# Patient Record
Sex: Female | Born: 1945
Health system: Southern US, Community
[De-identification: ages and names within clinical notes are randomized; demographics above are authoritative.]

## PROBLEM LIST (undated history)

## (undated) DIAGNOSIS — R053 Chronic cough: Secondary | ICD-10-CM

## (undated) DIAGNOSIS — I517 Cardiomegaly: Secondary | ICD-10-CM

## (undated) DIAGNOSIS — R05 Cough: Secondary | ICD-10-CM

## (undated) DIAGNOSIS — J349 Unspecified disorder of nose and nasal sinuses: Secondary | ICD-10-CM

## (undated) DIAGNOSIS — G4730 Sleep apnea, unspecified: Secondary | ICD-10-CM

## (undated) DIAGNOSIS — I10 Essential (primary) hypertension: Secondary | ICD-10-CM

## (undated) DIAGNOSIS — Z8616 Personal history of COVID-19: Secondary | ICD-10-CM

## (undated) DIAGNOSIS — E039 Hypothyroidism, unspecified: Secondary | ICD-10-CM

## (undated) HISTORY — DX: Essential (primary) hypertension: I10

---
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
## 1975-04-26 HISTORY — PX: TUBAL LIGATION: SHX77

## 1995-04-26 HISTORY — PX: OTHER SURGICAL HISTORY: SHX169

## 2006-02-24 ENCOUNTER — Ambulatory Visit: Payer: Self-pay | Admitting: Internal Medicine

## 2006-05-16 ENCOUNTER — Ambulatory Visit: Payer: Self-pay | Admitting: Internal Medicine

## 2007-05-22 ENCOUNTER — Ambulatory Visit: Payer: Self-pay | Admitting: Internal Medicine

## 2008-01-24 ENCOUNTER — Ambulatory Visit: Payer: Self-pay | Admitting: Unknown Physician Specialty

## 2008-10-06 ENCOUNTER — Ambulatory Visit: Payer: Self-pay | Admitting: Internal Medicine

## 2013-08-01 ENCOUNTER — Ambulatory Visit: Payer: Self-pay | Admitting: Unknown Physician Specialty

## 2013-08-02 LAB — PATHOLOGY REPORT

## 2016-05-25 ENCOUNTER — Other Ambulatory Visit: Payer: Self-pay | Admitting: Nurse Practitioner

## 2016-05-25 ENCOUNTER — Ambulatory Visit
Admission: RE | Admit: 2016-05-25 | Discharge: 2016-05-25 | Disposition: A | Discharge status: Home / Self Care | Payer: Medicare Other | Source: Ambulatory Visit | Attending: Nurse Practitioner | Admitting: Nurse Practitioner

## 2016-05-25 DIAGNOSIS — R05 Cough: Secondary | ICD-10-CM

## 2016-05-25 DIAGNOSIS — R059 Cough, unspecified: Secondary | ICD-10-CM

## 2016-08-18 DIAGNOSIS — S42009A Fracture of unspecified part of unspecified clavicle, initial encounter for closed fracture: Secondary | ICD-10-CM | POA: Insufficient documentation

## 2017-11-09 ENCOUNTER — Ambulatory Visit: Payer: Medicare Other | Admitting: Nurse Practitioner

## 2017-11-09 ENCOUNTER — Encounter: Payer: Self-pay | Admitting: Nurse Practitioner

## 2017-11-09 VITALS — BP 132/78 | HR 72 | Resp 16 | Ht 61.0 in | Wt 156.4 lb

## 2017-11-09 DIAGNOSIS — E559 Vitamin D deficiency, unspecified: Secondary | ICD-10-CM | POA: Diagnosis not present

## 2017-11-09 DIAGNOSIS — Z1239 Encounter for other screening for malignant neoplasm of breast: Secondary | ICD-10-CM | POA: Insufficient documentation

## 2017-11-09 DIAGNOSIS — R229 Localized swelling, mass and lump, unspecified: Secondary | ICD-10-CM

## 2017-11-09 DIAGNOSIS — Z0001 Encounter for general adult medical examination with abnormal findings: Secondary | ICD-10-CM | POA: Diagnosis not present

## 2017-11-09 DIAGNOSIS — R300 Dysuria: Secondary | ICD-10-CM | POA: Insufficient documentation

## 2017-11-09 DIAGNOSIS — Z1231 Encounter for screening mammogram for malignant neoplasm of breast: Secondary | ICD-10-CM | POA: Diagnosis not present

## 2017-11-09 DIAGNOSIS — I10 Essential (primary) hypertension: Secondary | ICD-10-CM | POA: Diagnosis not present

## 2017-11-09 MED ORDER — LISINOPRIL 5 MG PO TABS: 5.0000 mg | ORAL_TABLET | Freq: Every day | ORAL | 4 refills | Status: DC

## 2017-11-09 NOTE — Progress Notes (Signed)
[facility] [place], [place] [address]  Internal MEDICINE  Office Visit Note  Patient Name: Hailey Wong  [id number]  [id number]  Date of Service: 11/09/2017    Pt is here for routine health maintenance examination  Chief Complaint  Patient presents with  . Annual Exam  . Sinusitis    continuous congestion and cold symptoms for months     Sinusitis  This is a chronic problem. The current episode started more than 1 year ago. The problem has been gradually improving since onset. There has been no fever. The fever has been present for less than 1 day. She is experiencing no pain. Associated symptoms include congestion and sinus pressure. Pertinent negatives include no chills, coughing, neck pain, shortness of breath, sneezing or sore throat. Past treatments include antibiotics, acetaminophen and spray decongestants. The treatment provided mild relief.    Current Medication: Outpatient Encounter Medications as of 11/09/2017  Medication Sig  . ergocalciferol (DRISDOL) 50000 units capsule Take 50,000 Units by mouth daily. Take one tablet by mouth daily  . lisinopril (PRINIVIL,ZESTRIL) 5 MG tablet Take 1 tablet (5 mg total) by mouth daily.  . [DISCONTINUED] lisinopril (PRINIVIL,ZESTRIL) 5 MG tablet Take 5 mg by mouth daily.   No facility-administered encounter medications on file as of 11/09/2017.     Surgical History: Past Surgical History:  Procedure Laterality Date  . right ankle surgery  1997  . TUBAL LIGATION  1977    Medical History: Past Medical History:  Diagnosis Date  . Hypertension     Family History: No family history on file.    Review of Systems  Constitutional: Negative for chills, fatigue and unexpected weight change.  HENT: Positive for congestion, postnasal drip, rhinorrhea and sinus pressure. Negative for sneezing and sore throat.   Eyes: Negative.  Negative for redness.  Respiratory: Negative for cough, chest tightness,  shortness of breath and wheezing.   Cardiovascular: Negative for chest pain and palpitations.  Gastrointestinal: Negative for abdominal pain, constipation, diarrhea and nausea.  Endocrine: Negative for cold intolerance, heat intolerance, polydipsia, polyphagia and polyuria.  Genitourinary: Negative for dysuria and frequency.  Musculoskeletal: Negative for arthralgias, back pain, joint swelling and neck pain.  Skin: Negative for rash.  Allergic/Immunologic: Positive for environmental allergies.  Neurological: Negative.  Negative for tremors and numbness.  Hematological: Negative for adenopathy. Does not bruise/bleed easily.  Psychiatric/Behavioral: Negative for behavioral problems (Depression), sleep disturbance and suicidal ideas. The patient is not nervous/anxious.      Today's Vitals   11/09/17 1023  BP: 132/78  Pulse: 72  Resp: 16  SpO2: 99%  Weight: 156 lb 6.4 oz (70.9 kg)  Height: 5\' 1"  (1.549 m)    Physical Exam  Constitutional: She is oriented to person, place, and time. She appears well-developed and well-nourished. No distress.  HENT:  Head: Normocephalic and atraumatic.  Nose: Nose normal.  Mouth/Throat: Oropharynx is clear and moist. No oropharyngeal exudate.  Eyes: Pupils are equal, round, and reactive to light. Conjunctivae and EOM are normal.  Neck: Normal range of motion. Neck supple. No JVD present. Carotid bruit is not present. No tracheal deviation present. No thyromegaly present.  Cardiovascular: Normal rate, regular rhythm, normal heart sounds and intact distal pulses. Exam reveals no gallop and no friction rub.  No murmur heard. Pulmonary/Chest: Effort normal and breath sounds normal. No respiratory distress. She has no wheezes. She has no rales. She exhibits no tenderness.  Abdominal: Soft. Bowel sounds are normal. There is no tenderness.  Musculoskeletal: Normal range of motion.  Soft, round, palpable mass adjacent to [facility] joint of the left shoulder.  Approximately 7cm in diameter. Smooth edges.   Lymphadenopathy:    She has no cervical adenopathy.  Neurological: She is alert and oriented to person, place, and time. No cranial nerve deficit.  Skin: Skin is warm and dry. Capillary refill takes less than 2 seconds. She is not diaphoretic.  Psychiatric: She has a normal mood and affect. Her behavior is normal. Judgment and thought content normal.  Nursing note and vitals reviewed.   Assessment/Plan:  1. Encounter for general adult medical examination with abnormal findings Annual health maintenance exam today - CBC with Differential/Platelet - Comprehensive metabolic panel - T4, free - TSH - Lipid panel  2. Benign hypertension Stable. Continue lisinopril as prescribed. Routine, fasting labs ordered.  - T4, free - TSH - Lipid panel - lisinopril (PRINIVIL,ZESTRIL) 5 MG tablet; Take 1 tablet (5 mg total) by mouth daily.  Dispense: 90 tablet; Refill: 4  3. Localized superficial swelling, mass, or lump Will get prior ultrasound results to review. Follow up study as indicated.   4. Vitamin D deficiency - Vitamin D 1,25 dihydroxy  5. Screening for breast cancer - MM DIGITAL SCREENING BILATERAL; Future  6. Dysuria - UA/M w/rflx Culture, Routine  General Counseling: Shine verbalizes understanding of the findings of todays visit and agrees with plan of treatment. I have discussed any further diagnostic evaluation that may be needed or ordered today. We also reviewed her medications today. she has been encouraged to call the office with any questions or concerns that should arise related to todays visit.    Counseling:  Hypertension Counseling:   The following hypertensive lifestyle modification were recommended and discussed:  1. Limiting alcohol intake to less than 1 oz/day of ethanol:(24 oz of beer or 8 oz of wine or 2 oz of 100-proof whiskey). 2. Take baby ASA 81 mg daily. 3. Importance of regular aerobic exercise and losing  weight. 4. Reduce dietary saturated fat and cholesterol intake for overall cardiovascular health. 5. Maintaining adequate dietary potassium, calcium, and magnesium intake. 6. Regular monitoring of the blood pressure. 7. Reduce sodium intake to less than 100 mmol/day (less than 2.3 gm of sodium or less than 6 gm of sodium choride)   This patient was seen by [place] with Dr Lavera Guise as a part of collaborative care agreement  Orders Placed This Encounter  Procedures  . MM DIGITAL SCREENING BILATERAL  . UA/M w/rflx Culture, Routine  . CBC with Differential/Platelet  . Comprehensive metabolic panel  . T4, free  . TSH  . Lipid panel  . Vitamin D 1,25 dihydroxy    Meds ordered this encounter  Medications  . lisinopril (PRINIVIL,ZESTRIL) 5 MG tablet    Sig: Take 1 tablet (5 mg total) by mouth daily.    Dispense:  90 tablet    Refill:  4    Order Specific Question:   Supervising Provider    Answer:   Lavera Guise [[id number]]    Time spent: [place], MD  Internal Medicine

## 2017-11-10 LAB — SPECIMEN STATUS REPORT

## 2017-11-14 LAB — UA/M W/RFLX CULTURE, ROUTINE
BILIRUBIN UA: NEGATIVE
Glucose, UA: NEGATIVE
Ketones, UA: NEGATIVE
Nitrite, UA: NEGATIVE
Protein, UA: NEGATIVE
RBC UA: NEGATIVE
Specific Gravity, UA: 1.008 (ref 1.005–1.030)
Urobilinogen, Ur: 0.2 mg/dL (ref 0.2–1.0)
pH, UA: 6 (ref 5.0–7.5)

## 2017-11-14 LAB — URINE CULTURE, REFLEX

## 2017-11-14 LAB — MICROSCOPIC EXAMINATION: CASTS: NONE SEEN /LPF

## 2017-11-14 NOTE — Progress Notes (Signed)
Can you find out why this culture was canceled/ thanks

## 2017-11-21 ENCOUNTER — Other Ambulatory Visit: Payer: Self-pay | Admitting: Nurse Practitioner

## 2017-11-21 MED ORDER — ERGOCALCIFEROL 1.25 MG (50000 UT) PO CAPS: 50000.0000 [IU] | ORAL_CAPSULE | Freq: Every day | ORAL | 1 refills | Status: DC

## 2017-12-11 LAB — COMPREHENSIVE METABOLIC PANEL
A/G RATIO: 1.9 (ref 1.2–2.2)
ALBUMIN: 4.1 g/dL (ref 3.5–4.8)
ALK PHOS: 70 IU/L (ref 39–117)
ALT: 9 IU/L (ref 0–32)
AST: 15 IU/L (ref 0–40)
BILIRUBIN TOTAL: 0.4 mg/dL (ref 0.0–1.2)
BUN / CREAT RATIO: 13 (ref 12–28)
BUN: 9 mg/dL (ref 8–27)
CO2: 23 mmol/L (ref 20–29)
Calcium: 9.1 mg/dL (ref 8.7–10.3)
Chloride: 98 mmol/L (ref 96–106)
Creatinine, Ser: 0.69 mg/dL (ref 0.57–1.00)
GFR calc Af Amer: 101 mL/min/{1.73_m2} (ref >59)
GFR calc non Af Amer: 87 mL/min/{1.73_m2} (ref >59)
GLOBULIN, TOTAL: 2.2 g/dL (ref 1.5–4.5)
Glucose: 120 mg/dL — ABNORMAL HIGH (ref 65–99)
POTASSIUM: 4.3 mmol/L (ref 3.5–5.2)
SODIUM: 135 mmol/L (ref 134–144)
Total Protein: 6.3 g/dL (ref 6.0–8.5)

## 2017-12-11 LAB — CBC WITH DIFFERENTIAL/PLATELET
BASOS: 0 %
Basophils Absolute: 0 10*3/uL (ref 0.0–0.2)
EOS (ABSOLUTE): 0.2 10*3/uL (ref 0.0–0.4)
EOS: 3 %
HEMATOCRIT: 43.7 % (ref 34.0–46.6)
Hemoglobin: 14.6 g/dL (ref 11.1–15.9)
Immature Grans (Abs): 0 10*3/uL (ref 0.0–0.1)
Immature Granulocytes: 0 %
LYMPHS ABS: 2.7 10*3/uL (ref 0.7–3.1)
Lymphs: 34 %
MCH: 31.3 pg (ref 26.6–33.0)
MCHC: 33.4 g/dL (ref 31.5–35.7)
MCV: 94 fL (ref 79–97)
MONOS ABS: 0.8 10*3/uL (ref 0.1–0.9)
Monocytes: 10 %
NEUTROS ABS: 4.2 10*3/uL (ref 1.4–7.0)
Neutrophils: 53 %
Platelets: 295 10*3/uL (ref 150–450)
RBC: 4.66 x10E6/uL (ref 3.77–5.28)
RDW: 14.3 % (ref 12.3–15.4)
WBC: 7.8 10*3/uL (ref 3.4–10.8)

## 2017-12-11 LAB — VITAMIN D 1,25 DIHYDROXY
VITAMIN D 1, 25 (OH) TOTAL: 70 pg/mL — AB
VITAMIN D3 1, 25 (OH): 28 pg/mL
Vitamin D2 1, 25 (OH)2: 42 pg/mL

## 2017-12-11 LAB — LIPID PANEL
CHOL/HDL RATIO: 2.3 ratio (ref 0.0–4.4)
Cholesterol, Total: 198 mg/dL (ref 100–199)
HDL: 88 mg/dL (ref >39)
LDL Calculated: 95 mg/dL (ref 0–99)
Triglycerides: 75 mg/dL (ref 0–149)
VLDL Cholesterol Cal: 15 mg/dL (ref 5–40)

## 2017-12-11 LAB — TSH: TSH: 1.27 u[IU]/mL (ref 0.450–4.500)

## 2017-12-11 LAB — T4, FREE: FREE T4: 1.56 ng/dL (ref 0.82–1.77)

## 2017-12-15 ENCOUNTER — Telehealth: Payer: Self-pay

## 2017-12-15 NOTE — Progress Notes (Signed)
Called pt and notified her of labs looking good.

## 2017-12-15 NOTE — Telephone Encounter (Signed)
PT WAS NOTIFIED OF HER LABS LOOKING GOOD.

## 2018-04-12 ENCOUNTER — Telehealth: Payer: Self-pay | Admitting: Nurse Practitioner

## 2018-04-12 NOTE — Telephone Encounter (Signed)
LEFT MESSAGE TO RESCHEDULE 11/19/18 APPOINTMENT.JW

## 2018-08-14 ENCOUNTER — Encounter: Payer: Self-pay | Admitting: Nurse Practitioner

## 2018-08-14 ENCOUNTER — Other Ambulatory Visit: Payer: Self-pay

## 2018-08-14 ENCOUNTER — Ambulatory Visit: Payer: Medicare Other | Admitting: Nurse Practitioner

## 2018-08-14 VITALS — BP 142/107 | HR 84 | Temp 98.2°F | Resp 16 | Ht 61.0 in | Wt 158.0 lb

## 2018-08-14 DIAGNOSIS — R040 Epistaxis: Secondary | ICD-10-CM | POA: Insufficient documentation

## 2018-08-14 DIAGNOSIS — I10 Essential (primary) hypertension: Secondary | ICD-10-CM | POA: Diagnosis not present

## 2018-08-14 MED ORDER — ATENOLOL 25 MG PO TABS: 25.0000 mg | ORAL_TABLET | Freq: Every evening | ORAL | 2 refills | Status: DC

## 2018-08-14 NOTE — Progress Notes (Signed)
[facility] [place], [place] [address]  Internal MEDICINE  Office Visit Note  Patient Name: Hailey Wong  [id number]  [id number]  Date of Service: 08/14/2018   Pt is here for a sick visit.  Chief Complaint  Patient presents with  . Epistaxis    three bad nose bleeds yesterday   . Hypertension    bp has been running      The patient states that she had episode of nose bleed yesterday. Blood pressure was severely elevated. When checked at home, her blood pressure was 235/104. She did take a second of her lisinopril 5mg  tablets. Did come down some, but then both nostrils started to bleed. Called paramedics, and her blood pressure was 198/106. When she arrived at [facility] ER her blood pressure was over 948 systolic/ /546 systolic. She was put on heart monitor and rested. After two hours, her blood pressure came down to 167/90. She was discharged home. She was not given anything to lower her blood pressure. She was given dose of afrin in ER. Was told to begin using her flonase nasal spray every day and use humidfier at home. She was told to follow up with PCP. This morning, at home, your blood pressure was 227/60. She denies headache, but does feel some pressure in her head, mostly behind her eyes.  She has some visual changes, but unsure if this is related to blood pressure or changes in her vision.        Current Medication:  Outpatient Encounter Medications as of 08/14/2018  Medication Sig  . lisinopril (PRINIVIL,ZESTRIL) 5 MG tablet Take 1 tablet (5 mg total) by mouth daily.  Marland Kitchen atenolol (TENORMIN) 25 MG tablet Take 1 tablet (25 mg total) by mouth every evening.  . ergocalciferol (DRISDOL) 50000 units capsule Take 1 capsule (50,000 Units total) by mouth daily. Take one tablet by mouth daily (Patient not taking: Reported on 08/14/2018)   No facility-administered encounter medications on file as of 08/14/2018.       Medical History: Past Medical History:   Diagnosis Date  . Hypertension      Today's Vitals   08/14/18 0844  BP: (!) 142/107  Pulse: 84  Resp: 16  Temp: 98.2 F (36.8 C)  SpO2: 99%  Weight: 158 lb (71.7 kg)  Height: 5\' 1"  (1.549 m)   Body mass index is 29.85 kg/m.  Review of Systems  Constitutional: Negative for chills, fatigue and unexpected weight change.  HENT: Positive for nosebleeds and sinus pressure. Negative for congestion, rhinorrhea, sneezing and sore throat. Tinnitus: l.   Eyes: Negative for redness.  Respiratory: Negative for stridor.   Cardiovascular: Negative for chest pain and palpitations.       Blood pressure has been very elevated   Gastrointestinal: Negative for abdominal pain, constipation, diarrhea, nausea and vomiting.  Endocrine: Negative for cold intolerance, heat intolerance, polydipsia and polyuria.  Musculoskeletal: Negative for neck pain.  Skin: Negative for rash.  Allergic/Immunologic: Positive for environmental allergies.  Neurological: Positive for headaches. Negative for tremors and numbness.  Hematological: Negative for adenopathy. Does not bruise/bleed easily.  Psychiatric/Behavioral: Negative for behavioral problems (Depression), sleep disturbance and suicidal ideas. The patient is not nervous/anxious.     Physical Exam Constitutional:      General: She is not in acute distress.    Appearance: Normal appearance. She is well-developed. She is not diaphoretic.  HENT:     Head: Normocephalic and atraumatic.     Nose:  Right Nostril: Epistaxis and septal hematoma present.     Left Nostril: Epistaxis and septal hematoma present.     Right Turbinates: Enlarged.     Left Turbinates: Enlarged.     Mouth/Throat:     Pharynx: No oropharyngeal exudate.  Eyes:     Pupils: Pupils are equal, round, and reactive to light.  Neck:     Musculoskeletal: Normal range of motion and neck supple.     Thyroid: No thyromegaly.     Vascular: No JVD.     Trachea: No tracheal deviation.   Cardiovascular:     Rate and Rhythm: Normal rate and regular rhythm.     Heart sounds: Normal heart sounds. No murmur. No friction rub. No gallop.   Pulmonary:     Effort: Pulmonary effort is normal. No respiratory distress.     Breath sounds: No wheezing or rales.  Chest:     Chest wall: No tenderness.  Abdominal:     General: Bowel sounds are normal.     Palpations: Abdomen is soft.  Musculoskeletal: Normal range of motion.  Lymphadenopathy:     Cervical: No cervical adenopathy.  Skin:    General: Skin is warm and dry.  Neurological:     Mental Status: She is alert and oriented to person, place, and time.     Cranial Nerves: No cranial nerve deficit.  Psychiatric:        Behavior: Behavior normal.        Thought Content: Thought content normal.        Judgment: Judgment normal.    Assessment/Plan: 1. Benign hypertension Add atenolol 25mg  every evening. Continue lisinopril 5mg  daily. Check blood pressures at home and notify office of any negative side effects of medications or worsening blood pressures.  - atenolol (TENORMIN) 25 MG tablet; Take 1 tablet (25 mg total) by mouth every evening.  Dispense: 30 tablet; Refill: 2  2. Epistaxis Possibly related to elevated blood pressures. Encouraged her to use flonase nasal spray daily, as suggested in ER yesterday. May consider referral to ENT in future.   General Counseling: Hailey Wong understanding of the findings of todays visit and agrees with plan of treatment. I have discussed any further diagnostic evaluation that may be needed or ordered today. We also reviewed her medications today. she has been encouraged to call the office with any questions or concerns that should arise related to todays visit.    Counseling:  Hypertension Counseling:   The following hypertensive lifestyle modification were recommended and discussed:  1. Limiting alcohol intake to less than 1 oz/day of ethanol:(24 oz of beer or 8 oz of wine or  2 oz of 100-proof whiskey). 2. Take baby ASA 81 mg daily. 3. Importance of regular aerobic exercise and losing weight. 4. Reduce dietary saturated fat and cholesterol intake for overall cardiovascular health. 5. Maintaining adequate dietary potassium, calcium, and magnesium intake. 6. Regular monitoring of the blood pressure. 7. Reduce sodium intake to less than 100 mmol/day (less than 2.3 gm of sodium or less than 6 gm of sodium choride)   This patient was seen by [place] with Dr Lavera Guise as a part of collaborative care agreement  Meds ordered this encounter  Medications  . atenolol (TENORMIN) 25 MG tablet    Sig: Take 1 tablet (25 mg total) by mouth every evening.    Dispense:  30 tablet    Refill:  2    Order Specific Question:  Supervising Provider    Answer:   Lavera Guise [[id number]]    Time spent: 25 Minutes

## 2018-08-15 ENCOUNTER — Telehealth: Payer: Self-pay | Admitting: Nurse Practitioner

## 2018-08-15 ENCOUNTER — Encounter: Payer: Self-pay | Admitting: Nurse Practitioner

## 2018-08-15 NOTE — Telephone Encounter (Signed)
Called patient in regards to ultrasound results of shoulder , patient advised normal ultrasound. Patient is keeping a constant eye on her bp. She stated that this morning it was 167/94, and she had one more nose bleed yesterday afternoon. Patient is to notify us if anything worsens.

## 2018-08-17 ENCOUNTER — Telehealth: Payer: Self-pay | Admitting: Nurse Practitioner

## 2018-08-17 NOTE — Telephone Encounter (Signed)
Patient was seen on Tuesday with increase bp , patient was put on Atenolol 25mg  and her bp is coming down but pulse is 53 , per heather cut the atenolol to 1/2 tab at night and monitor , call us if any changes. Patient understood.

## 2018-09-03 ENCOUNTER — Other Ambulatory Visit: Payer: Self-pay

## 2018-09-03 ENCOUNTER — Ambulatory Visit: Payer: Medicare Other | Admitting: Nurse Practitioner

## 2018-09-03 ENCOUNTER — Encounter: Payer: Self-pay | Admitting: Nurse Practitioner

## 2018-09-03 VITALS — BP 142/71 | HR 59 | Resp 16 | Ht 61.0 in | Wt 153.6 lb

## 2018-09-03 DIAGNOSIS — I10 Essential (primary) hypertension: Secondary | ICD-10-CM

## 2018-09-03 DIAGNOSIS — F17210 Nicotine dependence, cigarettes, uncomplicated: Secondary | ICD-10-CM | POA: Insufficient documentation

## 2018-09-03 DIAGNOSIS — R0609 Other forms of dyspnea: Secondary | ICD-10-CM | POA: Diagnosis not present

## 2018-09-03 NOTE — Progress Notes (Signed)
Pt blood pressure elevated, taken twice  1st reading 158/83 2nd reading: 142/88 (home machine) Office machine- 142/71 Informed provider Pt pulse low

## 2018-09-03 NOTE — Progress Notes (Signed)
[facility] [place], [place] [address]  Internal MEDICINE  Office Visit Note  Patient Name: Hailey Wong  [id number]  [id number]  Date of Service: 09/03/2018  Chief Complaint  Patient presents with  . Medical Management of Chronic Issues    3wk follow up  . Hypertension    dont understand the fluctuation of blood pressure    The patient is here to follow up of her blood pressure. Added atenolol 25mg  daily. Taking this in the evenings. She has limited, significantly her sodium intake. Taken caffeine completely out of her diet. She monitors her blood pressure twice daily. In the evenings, it is generally running in the 546E and 703J systolic and 00X to 38H diastolic. In the mornings, she takes her blood pressure prior to taking her blod pressure medication. These readings are generally higher. They run in 829H and 371I systolic and 96V diastolic. She states that she has tolerated the medication addition well. Has no negative side effects to report. She states that she does get short of breat with exertion and she continues to smoke cigarettes. She has no family history of CAD that she knows of.       Current Medication: Outpatient Encounter Medications as of 09/03/2018  Medication Sig  . atenolol (TENORMIN) 25 MG tablet Take 1 tablet (25 mg total) by mouth every evening.  Marland Kitchen lisinopril (PRINIVIL,ZESTRIL) 5 MG tablet Take 1 tablet (5 mg total) by mouth daily.  . [DISCONTINUED] ergocalciferol (DRISDOL) 50000 units capsule Take 1 capsule (50,000 Units total) by mouth daily. Take one tablet by mouth daily (Patient not taking: Reported on 08/14/2018)   No facility-administered encounter medications on file as of 09/03/2018.     Surgical History: Past Surgical History:  Procedure Laterality Date  . right ankle surgery  1997  . TUBAL LIGATION  1977    Medical History: Past Medical History:  Diagnosis Date  . Hypertension     Family History: History reviewed.  No pertinent family history.  Social History   Socioeconomic History  . Marital status: Married    Spouse name: Not on file  . Number of children: Not on file  . Years of education: Not on file  . Highest education level: Not on file  Occupational History  . Not on file  Social Needs  . Financial resource strain: Not on file  . Food insecurity:    Worry: Not on file    Inability: Not on file  . Transportation needs:    Medical: Not on file    Non-medical: Not on file  Tobacco Use  . Smoking status: Current Every Day Smoker    Types: Cigarettes  . Smokeless tobacco: Never Used  Substance and Sexual Activity  . Alcohol use: Yes    Alcohol/week: 1.0 standard drinks    Types: 1 Glasses of wine per week    Comment: wine daily  . Drug use: Not on file  . Sexual activity: Not on file  Lifestyle  . Physical activity:    Days per week: Not on file    Minutes per session: Not on file  . Stress: Not on file  Relationships  . Social connections:    Talks on phone: Not on file    Gets together: Not on file    Attends religious service: Not on file    Active member of club or organization: Not on file    Attends meetings of clubs or organizations: Not on file  Relationship status: Not on file  . Intimate partner violence:    Fear of current or ex partner: Not on file    Emotionally abused: Not on file    Physically abused: Not on file    Forced sexual activity: Not on file  Other Topics Concern  . Not on file  Social History Narrative  . Not on file      Review of Systems  Constitutional: Negative for chills, fatigue and unexpected weight change.  HENT: Negative for congestion, nosebleeds, rhinorrhea, sinus pressure, sneezing and sore throat. Tinnitus: l.   Respiratory: Positive for shortness of breath. Negative for chest tightness and stridor.        Admits to some shortness of breath with exertion.   Cardiovascular: Negative for chest pain and palpitations.        Improved blood pressure   Gastrointestinal: Negative for abdominal pain, constipation, diarrhea, nausea and vomiting.  Endocrine: Negative for cold intolerance, heat intolerance, polydipsia and polyuria.  Musculoskeletal: Negative for neck pain.  Skin: Negative for rash.  Allergic/Immunologic: Positive for environmental allergies.  Neurological: Negative for tremors, numbness and headaches.  Hematological: Negative for adenopathy. Does not bruise/bleed easily.  Psychiatric/Behavioral: Negative for behavioral problems (Depression), sleep disturbance and suicidal ideas. The patient is not nervous/anxious.     Vital Signs: BP (!) 142/71 (BP Location: Left Arm, Patient Position: Sitting, Cuff Size: Large) Comment: 1st reading 158/83  Pulse (!) 59   Resp 16   Ht 5\' 1"  (1.549 m)   Wt 153 lb 9.6 oz (69.7 kg)   SpO2 98%   BMI 29.02 kg/m    Physical Exam Vitals signs and nursing note reviewed.  Constitutional:      General: She is not in acute distress.    Appearance: Normal appearance. She is well-developed. She is not diaphoretic.  HENT:     Head: Normocephalic and atraumatic.     Mouth/Throat:     Pharynx: No oropharyngeal exudate.  Eyes:     Pupils: Pupils are equal, round, and reactive to light.  Neck:     Musculoskeletal: Normal range of motion and neck supple.     Thyroid: No thyromegaly.     Vascular: No JVD.     Trachea: No tracheal deviation.  Cardiovascular:     Rate and Rhythm: Normal rate and regular rhythm.     Heart sounds: Normal heart sounds. No murmur. No friction rub. No gallop.   Pulmonary:     Effort: Pulmonary effort is normal. No respiratory distress.     Breath sounds: Normal breath sounds. No wheezing or rales.  Chest:     Chest wall: No tenderness.  Musculoskeletal: Normal range of motion.  Lymphadenopathy:     Cervical: No cervical adenopathy.  Skin:    General: Skin is warm and dry.  Neurological:     Mental Status: She is alert and oriented to  person, place, and time.     Cranial Nerves: No cranial nerve deficit.  Psychiatric:        Behavior: Behavior normal.        Thought Content: Thought content normal.        Judgment: Judgment normal.    Assessment/Plan: 1. Benign hypertension Improved bp. Continue listinopril and atenolol as prescribed. Recommend she continue monitoring blood pressures closely. Advised her to take AM bloodpressure about an hour after she takes medication. Discussed heart healthy diet. Will get echocardiogram to evaluate cardiac function.  - ECHOCARDIOGRAM COMPLETE; Future  2. Dyspnea  on exertion Will get echocardiogram and exercise stress test for further evaluation.  - ECHOCARDIOGRAM COMPLETE; Future  3. Cigarette smoker Smoking cessation discussed. Patient not really ready to quit smoking at this time.   General Counseling: numa heatwole understanding of the findings of todays visit and agrees with plan of treatment. I have discussed any further diagnostic evaluation that may be needed or ordered today. We also reviewed her medications today. she has been encouraged to call the office with any questions or concerns that should arise related to todays visit.  Cardiac risk factor modification:  1. Control blood pressure. 2. Exercise as prescribed. 3. Follow low sodium, low fat diet. and low fat and low cholestrol diet. 4. Take ASA 81mg  once a day. 5. Restricted calories diet to lose weight.  This patient was seen by Leretha Pol FNP Collaboration with Dr Lavera Guise as a part of collaborative care agreement  Orders Placed This Encounter  Procedures  . EXERCISE TOLERANCE TEST (ETT)  . ECHOCARDIOGRAM COMPLETE      Time spent: 77 Minutes      Dr Lavera Guise Internal medicine

## 2018-09-14 ENCOUNTER — Other Ambulatory Visit: Payer: Self-pay

## 2018-09-14 ENCOUNTER — Ambulatory Visit: Payer: Medicare Other

## 2018-09-14 DIAGNOSIS — I10 Essential (primary) hypertension: Secondary | ICD-10-CM

## 2018-09-14 DIAGNOSIS — R0600 Dyspnea, unspecified: Secondary | ICD-10-CM | POA: Diagnosis not present

## 2018-09-14 DIAGNOSIS — R0609 Other forms of dyspnea: Secondary | ICD-10-CM

## 2018-09-14 DIAGNOSIS — F17210 Nicotine dependence, cigarettes, uncomplicated: Secondary | ICD-10-CM

## 2018-09-24 ENCOUNTER — Telehealth: Payer: Self-pay | Admitting: Nurse Practitioner

## 2018-09-24 NOTE — Telephone Encounter (Signed)
Patient called requesting results for Echo performed on 09-14-18. I see where they were scanned in her chart, but I did not see where you had reviewed them. Can you let me know if you have went over them. Do you want me to schedule an appt for the results or release over phone?

## 2018-11-06 ENCOUNTER — Encounter: Payer: Self-pay | Admitting: Nurse Practitioner

## 2018-11-06 ENCOUNTER — Ambulatory Visit: Payer: Medicare Other | Admitting: Nurse Practitioner

## 2018-11-06 ENCOUNTER — Other Ambulatory Visit: Payer: Self-pay

## 2018-11-06 VITALS — BP 151/83 | HR 55 | Resp 16 | Ht 61.0 in | Wt 150.4 lb

## 2018-11-06 DIAGNOSIS — F17210 Nicotine dependence, cigarettes, uncomplicated: Secondary | ICD-10-CM

## 2018-11-06 DIAGNOSIS — R300 Dysuria: Secondary | ICD-10-CM

## 2018-11-06 DIAGNOSIS — I10 Essential (primary) hypertension: Secondary | ICD-10-CM | POA: Diagnosis not present

## 2018-11-06 DIAGNOSIS — Z1239 Encounter for other screening for malignant neoplasm of breast: Secondary | ICD-10-CM | POA: Diagnosis not present

## 2018-11-06 DIAGNOSIS — Z0001 Encounter for general adult medical examination with abnormal findings: Secondary | ICD-10-CM

## 2018-11-06 MED ORDER — ATENOLOL 25 MG PO TABS: 25.0000 mg | ORAL_TABLET | Freq: Every evening | ORAL | 3 refills | Status: DC

## 2018-11-06 MED ORDER — LISINOPRIL 10 MG PO TABS: 10.0000 mg | ORAL_TABLET | Freq: Every day | ORAL | 3 refills | Status: DC

## 2018-11-06 NOTE — Progress Notes (Signed)
Pt blood pressure elevated, pt took blood pressure this morning at 630 am it was 117/68

## 2018-11-06 NOTE — Progress Notes (Signed)
[facility] [place], [place] [address]  Internal MEDICINE  Office Visit Note  Patient Name: Hailey Wong  [id number]  [id number]  Date of Service: 11/06/2018   Pt is here for routine health maintenance examination  Chief Complaint  Patient presents with  . Medicare Wellness  . Hypertension  . Quality Metric Gaps    colonoscopy, mammogram     The patient is here for health maintenance exam. Blood pressure is still elevated. She has brought blood pressure log with her. Generally running high 140s to mid 967E systolic and between 70 and 80 diastaltically. She admits that she doesn't always make the best diet choices. Is limiting her salt intake. Takes lisinopril 5mg  in the morning and atenolol 25mg  at night. She had been having nosebleeds prior to starting on lisinopril. Has not had another since this medication was added. She did have echocardiogram in May. This showed normal LVEF with mild left ventricular hypertrophy. There is mild aortic root dilation as well as some dilation of the ascending aorta. There is also mild aotic valve sclerosis. She states that she feels good. She is due to have routine, fasting labs done as well as mammogram in 11/2018.     Current Medication: Outpatient Encounter Medications as of 11/06/2018  Medication Sig  . atenolol (TENORMIN) 25 MG tablet Take 1 tablet (25 mg total) by mouth every evening.  Marland Kitchen lisinopril (ZESTRIL) 10 MG tablet Take 1 tablet (10 mg total) by mouth daily.  . [DISCONTINUED] atenolol (TENORMIN) 25 MG tablet Take 1 tablet (25 mg total) by mouth every evening.  . [DISCONTINUED] lisinopril (PRINIVIL,ZESTRIL) 5 MG tablet Take 1 tablet (5 mg total) by mouth daily.   No facility-administered encounter medications on file as of 11/06/2018.     Surgical History: Past Surgical History:  Procedure Laterality Date  . right ankle surgery  1997  . TUBAL LIGATION  1977    Medical History: Past Medical History:   Diagnosis Date  . Hypertension     Family History: History reviewed. No pertinent family history.    Review of Systems  Constitutional: Negative for chills, fatigue and unexpected weight change.  HENT: Negative for congestion, nosebleeds, rhinorrhea, sinus pressure, sneezing and sore throat. Tinnitus: l.   Respiratory: Negative for chest tightness, shortness of breath and stridor.        Admits to some shortness of breath with exertion.   Cardiovascular: Negative for chest pain and palpitations.       Elevated blood pressure.   Gastrointestinal: Negative for abdominal pain, constipation, diarrhea, nausea and vomiting.  Endocrine: Negative for cold intolerance, heat intolerance, polydipsia and polyuria.  Musculoskeletal: Negative for neck pain.  Skin: Negative for rash.  Allergic/Immunologic: Positive for environmental allergies.  Neurological: Negative for tremors, numbness and headaches.  Hematological: Negative for adenopathy. Does not bruise/bleed easily.  Psychiatric/Behavioral: Negative for behavioral problems (Depression), sleep disturbance and suicidal ideas. The patient is not nervous/anxious.     Today's Vitals   11/06/18 0916  BP: (!) 151/83  Pulse: (!) 55  Resp: 16  SpO2: 100%  Weight: 150 lb 6.4 oz (68.2 kg)  Height: 5\' 1"  (1.549 m)   Body mass index is 28.42 kg/m.  Physical Exam Vitals signs and nursing note reviewed.  Constitutional:      General: She is not in acute distress.    Appearance: Normal appearance. She is well-developed. She is not diaphoretic.  HENT:     Head: Normocephalic and atraumatic.  Mouth/Throat:     Pharynx: No oropharyngeal exudate.  Eyes:     Extraocular Movements: Extraocular movements intact.     Conjunctiva/sclera: Conjunctivae normal.     Pupils: Pupils are equal, round, and reactive to light.  Neck:     Musculoskeletal: Normal range of motion and neck supple.     Thyroid: No thyromegaly.     Vascular: No carotid  bruit or JVD.     Trachea: No tracheal deviation.  Cardiovascular:     Rate and Rhythm: Normal rate and regular rhythm.     Pulses: Normal pulses.     Heart sounds: Normal heart sounds. No murmur. No friction rub. No gallop.   Pulmonary:     Effort: Pulmonary effort is normal. No respiratory distress.     Breath sounds: Normal breath sounds. No wheezing or rales.  Chest:     Chest wall: No tenderness.     Breasts:        Right: Inverted nipple present. No swelling, bleeding, mass, nipple discharge, skin change or tenderness.        Left: Inverted nipple present. No swelling, bleeding, mass, nipple discharge, skin change or tenderness.  Abdominal:     General: Abdomen is flat.     Palpations: Abdomen is soft.     Tenderness: There is no abdominal tenderness.  Musculoskeletal: Normal range of motion.  Lymphadenopathy:     Cervical: No cervical adenopathy.  Skin:    General: Skin is warm and dry.  Neurological:     General: No focal deficit present.     Mental Status: She is alert and oriented to person, place, and time.     Cranial Nerves: No cranial nerve deficit.  Psychiatric:        Behavior: Behavior normal.        Thought Content: Thought content normal.        Judgment: Judgment normal.    Depression screen [facility] 2/9 11/06/2018 09/03/2018 11/09/2017 11/09/2017  Decreased Interest 0 0 0 0  Down, Depressed, Hopeless 0 0 0 0  PHQ - 2 Score 0 0 0 0    Functional Status Survey: Is the patient deaf or have difficulty hearing?: No Does the patient have difficulty seeing, even when wearing glasses/contacts?: No Does the patient have difficulty concentrating, remembering, or making decisions?: No Does the patient have difficulty walking or climbing stairs?: No Does the patient have difficulty dressing or bathing?: No Does the patient have difficulty doing errands alone such as visiting a doctor's office or shopping?: No  MMSE - Mini Mental State Exam 11/06/2018 11/09/2017   Orientation to time 5 5  Orientation to Place 5 5  Registration 3 3  Attention/ Calculation 5 5  Recall 3 3  Language- name 2 objects 2 2  Language- repeat 1 1  Language- follow 3 step command 3 3  Language- read & follow direction 1 1  Write a sentence 1 1  Copy design 1 1  Total score 30 30    Fall Risk  11/06/2018 09/03/2018 11/09/2017 11/09/2017  Falls in the past year? 0 0 No No    Assessment/Plan: 1. Encounter for general adult medical examination with abnormal findings Annual health maintenance exam today.   2. Benign hypertension Increase lisinopril to 10mg  daily. Continue atenolol as prescribed.  - lisinopril (ZESTRIL) 10 MG tablet; Take 1 tablet (10 mg total) by mouth daily.  Dispense: 30 tablet; Refill: 3 - atenolol (TENORMIN) 25 MG tablet; Take 1 tablet (25  mg total) by mouth every evening.  Dispense: 90 tablet; Refill: 3  3. Screening for breast cancer - MM DIGITAL SCREENING BILATERAL; Future  4. Cigarette smoker sterongly encouraged smoking cessation.   5. Dysuria - UA/M w/rflx Culture, Routine  General Counseling: Salisa verbalizes understanding of the findings of todays visit and agrees with plan of treatment. I have discussed any further diagnostic evaluation that may be needed or ordered today. We also reviewed her medications today. she has been encouraged to call the office with any questions or concerns that should arise related to todays visit.    Counseling:  Hypertension Counseling:   The following hypertensive lifestyle modification were recommended and discussed:  1. Limiting alcohol intake to less than 1 oz/day of ethanol:(24 oz of beer or 8 oz of wine or 2 oz of 100-proof whiskey). 2. Take baby ASA 81 mg daily. 3. Importance of regular aerobic exercise and losing weight. 4. Reduce dietary saturated fat and cholesterol intake for overall cardiovascular health. 5. Maintaining adequate dietary potassium, calcium, and magnesium intake. 6. Regular  monitoring of the blood pressure. 7. Reduce sodium intake to less than 100 mmol/day (less than 2.3 gm of sodium or less than 6 gm of sodium choride)   This patient was seen by [place] with Dr Lavera Guise as a part of collaborative care agreement  Orders Placed This Encounter  Procedures  . MM DIGITAL SCREENING BILATERAL  . UA/M w/rflx Culture, Routine    Meds ordered this encounter  Medications  . lisinopril (ZESTRIL) 10 MG tablet    Sig: Take 1 tablet (10 mg total) by mouth daily.    Dispense:  30 tablet    Refill:  3    Please note increased dose.    Order Specific Question:   Supervising Provider    Answer:   Lavera Guise [[id number]]  . atenolol (TENORMIN) 25 MG tablet    Sig: Take 1 tablet (25 mg total) by mouth every evening.    Dispense:  90 tablet    Refill:  3    Order Specific Question:   Supervising Provider    Answer:   Lavera Guise [[id number]]    Time spent: [place], MD  Internal Medicine

## 2018-11-08 ENCOUNTER — Telehealth: Payer: Self-pay

## 2018-11-08 ENCOUNTER — Other Ambulatory Visit: Payer: Self-pay | Admitting: Nurse Practitioner

## 2018-11-08 DIAGNOSIS — N390 Urinary tract infection, site not specified: Secondary | ICD-10-CM

## 2018-11-08 LAB — UA/M W/RFLX CULTURE, ROUTINE
Bilirubin, UA: NEGATIVE
Glucose, UA: NEGATIVE
Ketones, UA: NEGATIVE
Nitrite, UA: NEGATIVE
Protein,UA: NEGATIVE
RBC, UA: NEGATIVE
Specific Gravity, UA: 1.009 (ref 1.005–1.030)
Urobilinogen, Ur: 0.2 mg/dL (ref 0.2–1.0)
pH, UA: 6 (ref 5.0–7.5)

## 2018-11-08 LAB — MICROSCOPIC EXAMINATION: Casts: NONE SEEN /lpf

## 2018-11-08 LAB — URINE CULTURE, REFLEX

## 2018-11-08 MED ORDER — NITROFURANTOIN MONOHYD MACRO 100 MG PO CAPS: 100.0000 mg | ORAL_CAPSULE | Freq: Two times a day (BID) | ORAL | 0 refills | Status: DC

## 2018-11-08 NOTE — Progress Notes (Signed)
Mild, normal flora, uti at CPE. Added macrobid 100mg  twice daily for 5 days. Sent prescription to [facility].

## 2018-11-08 NOTE — Telephone Encounter (Signed)
-----   Message from Ronnell Freshwater, NP sent at 11/08/2018  9:00 AM EDT ----- Please let the patient know she had Mild, normal flora, uti at CPE. Added macrobid 100mg  twice daily for 5 days. Sent prescription to [facility]. thanks

## 2018-11-08 NOTE — Telephone Encounter (Signed)
Pt called that her bp was Tuesday 104/59 yesterday 150/7 and today 146/80 she don't start med yet as per heather advised start med due to her last few bp reading and keep check bp and call us back with reading if low and keep follow up appt

## 2018-11-08 NOTE — Telephone Encounter (Signed)
Pt advised urine did showed infection we send antibiotic

## 2018-11-19 ENCOUNTER — Ambulatory Visit: Payer: Self-pay | Admitting: Nurse Practitioner

## 2018-12-06 ENCOUNTER — Other Ambulatory Visit: Payer: Self-pay | Admitting: Nurse Practitioner

## 2018-12-07 LAB — COMPREHENSIVE METABOLIC PANEL
ALT: 8 IU/L (ref 0–32)
AST: 12 IU/L (ref 0–40)
Albumin/Globulin Ratio: 2 (ref 1.2–2.2)
Albumin: 4.1 g/dL (ref 3.7–4.7)
Alkaline Phosphatase: 69 IU/L (ref 39–117)
BUN/Creatinine Ratio: 13 (ref 12–28)
BUN: 8 mg/dL (ref 8–27)
Bilirubin Total: 0.4 mg/dL (ref 0.0–1.2)
CO2: 27 mmol/L (ref 20–29)
Calcium: 9.1 mg/dL (ref 8.7–10.3)
Chloride: 96 mmol/L (ref 96–106)
Creatinine, Ser: 0.61 mg/dL (ref 0.57–1.00)
GFR calc Af Amer: 104 mL/min/{1.73_m2} (ref >59)
GFR calc non Af Amer: 90 mL/min/{1.73_m2} (ref >59)
Globulin, Total: 2.1 g/dL (ref 1.5–4.5)
Glucose: 96 mg/dL (ref 65–99)
Potassium: 4.5 mmol/L (ref 3.5–5.2)
Sodium: 136 mmol/L (ref 134–144)
Total Protein: 6.2 g/dL (ref 6.0–8.5)

## 2018-12-07 LAB — LIPID PANEL W/O CHOL/HDL RATIO
Cholesterol, Total: 201 mg/dL — ABNORMAL HIGH (ref 100–199)
HDL: 77 mg/dL (ref >39)
LDL Calculated: 109 mg/dL — ABNORMAL HIGH (ref 0–99)
Triglycerides: 77 mg/dL (ref 0–149)
VLDL Cholesterol Cal: 15 mg/dL (ref 5–40)

## 2018-12-07 LAB — CBC
Hematocrit: 42.8 % (ref 34.0–46.6)
Hemoglobin: 14.3 g/dL (ref 11.1–15.9)
MCH: 30.2 pg (ref 26.6–33.0)
MCHC: 33.4 g/dL (ref 31.5–35.7)
MCV: 90 fL (ref 79–97)
Platelets: 293 10*3/uL (ref 150–450)
RBC: 4.74 x10E6/uL (ref 3.77–5.28)
RDW: 13.4 % (ref 11.7–15.4)
WBC: 8.3 10*3/uL (ref 3.4–10.8)

## 2018-12-07 LAB — FERRITIN: Ferritin: 153 ng/mL — ABNORMAL HIGH (ref 15–150)

## 2018-12-07 LAB — T3: T3, Total: 118 ng/dL (ref 71–180)

## 2018-12-07 LAB — T4, FREE: Free T4: 1.29 ng/dL (ref 0.82–1.77)

## 2018-12-07 LAB — VITAMIN D 25 HYDROXY (VIT D DEFICIENCY, FRACTURES): Vit D, 25-Hydroxy: 17.6 ng/mL — ABNORMAL LOW (ref 30.0–100.0)

## 2018-12-07 LAB — HGB A1C W/O EAG: Hgb A1c MFr Bld: 6 % — ABNORMAL HIGH (ref 4.8–5.6)

## 2018-12-11 NOTE — Progress Notes (Signed)
Labs pretty good. Discuss at visit 12/20/2018

## 2018-12-18 ENCOUNTER — Ambulatory Visit: Payer: Medicare Other | Admitting: Nurse Practitioner

## 2018-12-20 ENCOUNTER — Encounter: Payer: Self-pay | Admitting: Nurse Practitioner

## 2018-12-20 ENCOUNTER — Ambulatory Visit: Payer: Medicare Other | Admitting: Nurse Practitioner

## 2018-12-20 VITALS — BP 147/84 | HR 50 | Resp 16 | Ht 61.0 in | Wt 150.0 lb

## 2018-12-20 DIAGNOSIS — I10 Essential (primary) hypertension: Secondary | ICD-10-CM

## 2018-12-20 DIAGNOSIS — R0683 Snoring: Secondary | ICD-10-CM | POA: Diagnosis not present

## 2018-12-20 DIAGNOSIS — E559 Vitamin D deficiency, unspecified: Secondary | ICD-10-CM

## 2018-12-20 MED ORDER — ERGOCALCIFEROL 1.25 MG (50000 UT) PO CAPS: 50000.0000 [IU] | ORAL_CAPSULE | ORAL | 5 refills | Status: DC

## 2018-12-20 MED ORDER — LISINOPRIL 10 MG PO TABS: 10.0000 mg | ORAL_TABLET | Freq: Every day | ORAL | 3 refills | Status: DC

## 2018-12-20 NOTE — Progress Notes (Signed)
[facility] [place], [place] [address]  Internal MEDICINE  Office Visit Note  Patient Name: Hailey Wong  [id number]  [id number]  Date of Service: 12/20/2018  Chief Complaint  Patient presents with  . Hypertension    added lisinopril  . Results    labs    The patient is here for follow up of blood pressure. Increased lisinopril was increased to 10mg  daily. She takes this first thing in the morning. She continues to take atenolol 25mg  every evening. Blood pressure are still a bit higher in the mornings. They are generally 0000000 systolic. In the mornings, they generally run [id number] to [id number] systolic.she has brought log of her blood pressures with her to review.  She is doing very well with consuming a heart healthy diet with lower sodium diet. Overall, she is doing well with the medication adjustment.  Of note, she admits to snoring loudly during the night. Unsure is she stops breathing while she sleeps. She does not wake up coughing or having a choking sensation. She is an early riser and is unable to get back to sleep.  She has had labs done prior to this visit. She had very mild elevation of LDL and total cholesterol. Her HDL and triglycerides were good. She has vitamin d deficiency.        Current Medication: Outpatient Encounter Medications as of 12/20/2018  Medication Sig  . atenolol (TENORMIN) 25 MG tablet Take 1 tablet (25 mg total) by mouth every evening.  Marland Kitchen lisinopril (ZESTRIL) 10 MG tablet Take 1 tablet (10 mg total) by mouth daily.  . [DISCONTINUED] lisinopril (ZESTRIL) 10 MG tablet Take 1 tablet (10 mg total) by mouth daily.  . ergocalciferol (DRISDOL) 1.25 MG (50000 UT) capsule Take 1 capsule (50,000 Units total) by mouth once a week.  . [DISCONTINUED] nitrofurantoin, macrocrystal-monohydrate, (MACROBID) 100 MG capsule Take 1 capsule (100 mg total) by mouth 2 (two) times daily. (Patient not taking: Reported on 12/20/2018)   No facility-administered  encounter medications on file as of 12/20/2018.     Surgical History: Past Surgical History:  Procedure Laterality Date  . right ankle surgery  1997  . TUBAL LIGATION  1977    Medical History: Past Medical History:  Diagnosis Date  . Hypertension     Family History: History reviewed. No pertinent family history.  Social History   Socioeconomic History  . Marital status: Married    Spouse name: Not on file  . Number of children: Not on file  . Years of education: Not on file  . Highest education level: Not on file  Occupational History  . Not on file  Social Needs  . Financial resource strain: Not on file  . Food insecurity    Worry: Not on file    Inability: Not on file  . Transportation needs    Medical: Not on file    Non-medical: Not on file  Tobacco Use  . Smoking status: Current Every Day Smoker    Types: Cigarettes  . Smokeless tobacco: Never Used  Substance and Sexual Activity  . Alcohol use: Yes    Alcohol/week: 1.0 standard drinks    Types: 1 Glasses of wine per week    Comment: wine daily  . Drug use: Never  . Sexual activity: Not on file  Lifestyle  . Physical activity    Days per week: Not on file    Minutes per session: Not on file  . Stress: Not on file  Relationships  . Social Herbalist on phone: Not on file    Gets together: Not on file    Attends religious service: Not on file    Active member of club or organization: Not on file    Attends meetings of clubs or organizations: Not on file    Relationship status: Not on file  . Intimate partner violence    Fear of current or ex partner: Not on file    Emotionally abused: Not on file    Physically abused: Not on file    Forced sexual activity: Not on file  Other Topics Concern  . Not on file  Social History Narrative  . Not on file      Review of Systems  Constitutional: Negative for chills, fatigue and unexpected weight change.  HENT: Negative for congestion,  nosebleeds, rhinorrhea, sinus pressure, sneezing and sore throat. Tinnitus: l.   Respiratory: Negative for chest tightness, shortness of breath and stridor.        Admits to some shortness of breath with exertion.   Cardiovascular: Negative for chest pain and palpitations.       Improved blood pressures since medication was increased.  Gastrointestinal: Negative for abdominal pain, constipation, diarrhea, nausea and vomiting.  Endocrine: Negative for cold intolerance, heat intolerance, polydipsia and polyuria.  Musculoskeletal: Negative for neck pain.  Skin: Negative for rash.  Allergic/Immunologic: Positive for environmental allergies.  Neurological: Negative for tremors, numbness and headaches.  Hematological: Negative for adenopathy. Does not bruise/bleed easily.  Psychiatric/Behavioral: Negative for behavioral problems (Depression), sleep disturbance and suicidal ideas. The patient is not nervous/anxious.     Today's Vitals   12/20/18 1042  BP: (!) 147/84  Pulse: (!) 50  Resp: 16  SpO2: 99%  Weight: 150 lb (68 kg)  Height: 5\' 1"  (1.549 m)   Body mass index is 28.34 kg/m.  Physical Exam Vitals signs and nursing note reviewed.  Constitutional:      General: She is not in acute distress.    Appearance: Normal appearance. She is well-developed. She is not diaphoretic.  HENT:     Head: Normocephalic and atraumatic.     Mouth/Throat:     Pharynx: No oropharyngeal exudate.  Eyes:     Pupils: Pupils are equal, round, and reactive to light.  Neck:     Musculoskeletal: Normal range of motion and neck supple.     Thyroid: No thyromegaly.     Vascular: No carotid bruit or JVD.     Trachea: No tracheal deviation.  Cardiovascular:     Rate and Rhythm: Normal rate and regular rhythm.     Heart sounds: Normal heart sounds. No murmur. No friction rub. No gallop.   Pulmonary:     Effort: Pulmonary effort is normal. No respiratory distress.     Breath sounds: Normal breath sounds. No  wheezing or rales.  Chest:     Chest wall: No tenderness.  Abdominal:     Palpations: Abdomen is soft.  Musculoskeletal: Normal range of motion.  Skin:    General: Skin is warm and dry.  Neurological:     Mental Status: She is alert and oriented to person, place, and time.     Cranial Nerves: No cranial nerve deficit.  Psychiatric:        Behavior: Behavior normal.        Thought Content: Thought content normal.        Judgment: Judgment normal.    Assessment/Plan: 1. Benign  hypertension Improved. Continue lisinopril 10mg  in morning and atenolol 25mg  every evening. Will get home sleep study due to blood pressure elevations in the mornings.  - Home sleep test - lisinopril (ZESTRIL) 10 MG tablet; Take 1 tablet (10 mg total) by mouth daily.  Dispense: 90 tablet; Refill: 3  2. Loud snoring Home sleep study ordered for further evaluation. Patient has sign and symptoms of OSA ( disturbed sleep, excessive fatigue during the day, uncontrolled bp and abnormal BMI). Baseline sleep study is ordered to further look into this. Long term complications of OSA was addressed with the patient. - Home sleep test  3. Vitamin D deficiency Drisdol 50000iu weekly for next few months.  - ergocalciferol (DRISDOL) 1.25 MG (50000 UT) capsule; Take 1 capsule (50,000 Units total) by mouth once a week.  Dispense: 4 capsule; Refill: 5  General Counseling: Luvinia verbalizes understanding of the findings of todays visit and agrees with plan of treatment. I have discussed any further diagnostic evaluation that may be needed or ordered today. We also reviewed her medications today. she has been encouraged to call the office with any questions or concerns that should arise related to todays visit.   Hypertension Counseling:   The following hypertensive lifestyle modification were recommended and discussed:  1. Limiting alcohol intake to less than 1 oz/day of ethanol:(24 oz of beer or 8 oz of wine or 2 oz of 100-proof  whiskey). 2. Take baby ASA 81 mg daily. 3. Importance of regular aerobic exercise and losing weight. 4. Reduce dietary saturated fat and cholesterol intake for overall cardiovascular health. 5. Maintaining adequate dietary potassium, calcium, and magnesium intake. 6. Regular monitoring of the blood pressure. 7. Reduce sodium intake to less than 100 mmol/day (less than 2.3 gm of sodium or less than 6 gm of sodium choride)   This patient was seen by [place] with Dr Lavera Guise as a part of collaborative care agreement  Orders Placed This Encounter  Procedures  . Home sleep test    Meds ordered this encounter  Medications  . lisinopril (ZESTRIL) 10 MG tablet    Sig: Take 1 tablet (10 mg total) by mouth daily.    Dispense:  90 tablet    Refill:  3    Please fill as 90 day prescription.    Order Specific Question:   Supervising Provider    Answer:   Lavera Guise [id number]  . ergocalciferol (DRISDOL) 1.25 MG (50000 UT) capsule    Sig: Take 1 capsule (50,000 Units total) by mouth once a week.    Dispense:  4 capsule    Refill:  5    Order Specific Question:   Supervising Provider    Answer:   Lavera Guise [[id number]]    Time spent: 43 Minutes      Dr Lavera Guise Internal medicine

## 2019-01-09 ENCOUNTER — Other Ambulatory Visit: Payer: Medicare Other | Admitting: Internal Medicine

## 2019-01-14 ENCOUNTER — Ambulatory Visit: Payer: Medicare Other | Admitting: Internal Medicine

## 2019-01-16 ENCOUNTER — Other Ambulatory Visit: Payer: Medicare Other | Admitting: Internal Medicine

## 2019-01-16 ENCOUNTER — Other Ambulatory Visit: Payer: Self-pay

## 2019-01-16 DIAGNOSIS — G4733 Obstructive sleep apnea (adult) (pediatric): Secondary | ICD-10-CM

## 2019-01-17 ENCOUNTER — Ambulatory Visit: Payer: Medicare Other | Admitting: Internal Medicine

## 2019-01-21 ENCOUNTER — Encounter: Payer: Self-pay | Admitting: Internal Medicine

## 2019-01-21 ENCOUNTER — Ambulatory Visit: Payer: Medicare Other | Admitting: Internal Medicine

## 2019-01-21 ENCOUNTER — Other Ambulatory Visit: Payer: Self-pay

## 2019-01-21 VITALS — BP 122/82 | HR 75 | Resp 16 | Ht 61.0 in | Wt 152.0 lb

## 2019-01-21 DIAGNOSIS — F17210 Nicotine dependence, cigarettes, uncomplicated: Secondary | ICD-10-CM | POA: Diagnosis not present

## 2019-01-21 DIAGNOSIS — I10 Essential (primary) hypertension: Secondary | ICD-10-CM

## 2019-01-21 DIAGNOSIS — G4733 Obstructive sleep apnea (adult) (pediatric): Secondary | ICD-10-CM

## 2019-01-21 NOTE — Progress Notes (Signed)
[facility] [place], [place] [address]  Pulmonary Sleep Medicine   Office Visit Note  Patient Name: Hailey Wong DOB: 05-22-1945 MRN [id number]  Date of Service: 01/21/2019  Complaints/HPI: Pt seen today for follow up on home sleep study.  Her AHi was 13.6.  She had a total of 2 obstructive apneas, and 34 hypopneas. Her heart rate did not drop below 47, and did not rise above 88.    ROS  General: (-) fever, (-) chills, (-) night sweats, (-) weakness Skin: (-) rashes, (-) itching,. Eyes: (-) visual changes, (-) redness, (-) itching. Nose and Sinuses: (-) nasal stuffiness or itchiness, (-) postnasal drip, (-) nosebleeds, (-) sinus trouble. Mouth and Throat: (-) sore throat, (-) hoarseness. Neck: (-) swollen glands, (-) enlarged thyroid, (-) neck pain. Respiratory: - cough, (-) bloody sputum, - shortness of breath, - wheezing. Cardiovascular: - ankle swelling, (-) chest pain. Lymphatic: (-) lymph node enlargement. Neurologic: (-) numbness, (-) tingling. Psychiatric: (-) anxiety, (-) depression   Current Medication: Outpatient Encounter Medications as of 01/21/2019  Medication Sig  . atenolol (TENORMIN) 25 MG tablet Take 1 tablet (25 mg total) by mouth every evening.  . ergocalciferol (DRISDOL) 1.25 MG (50000 UT) capsule Take 1 capsule (50,000 Units total) by mouth once a week.  Marland Kitchen lisinopril (ZESTRIL) 10 MG tablet Take 1 tablet (10 mg total) by mouth daily.   No facility-administered encounter medications on file as of 01/21/2019.     Surgical History: Past Surgical History:  Procedure Laterality Date  . right ankle surgery  1997  . TUBAL LIGATION  1977    Medical History: Past Medical History:  Diagnosis Date  . Hypertension     Family History: No family history on file.  Social History: Social History   Socioeconomic History  . Marital status: Married    Spouse name: Not on file  . Number of children: Not on file  . Years of  education: Not on file  . Highest education level: Not on file  Occupational History  . Not on file  Social Needs  . Financial resource strain: Not on file  . Food insecurity    Worry: Not on file    Inability: Not on file  . Transportation needs    Medical: Not on file    Non-medical: Not on file  Tobacco Use  . Smoking status: Current Every Day Smoker    Types: Cigarettes  . Smokeless tobacco: Never Used  Substance and Sexual Activity  . Alcohol use: Yes    Alcohol/week: 1.0 standard drinks    Types: 1 Glasses of wine per week    Comment: wine daily  . Drug use: Never  . Sexual activity: Not on file  Lifestyle  . Physical activity    Days per week: Not on file    Minutes per session: Not on file  . Stress: Not on file  Relationships  . Social Herbalist on phone: Not on file    Gets together: Not on file    Attends religious service: Not on file    Active member of club or organization: Not on file    Attends meetings of clubs or organizations: Not on file    Relationship status: Not on file  . Intimate partner violence    Fear of current or ex partner: Not on file    Emotionally abused: Not on file    Physically abused: Not on file    Forced  sexual activity: Not on file  Other Topics Concern  . Not on file  Social History Narrative  . Not on file    Vital Signs: Blood pressure 122/82, pulse 75, resp. rate 16, height 5\' 1"  (1.549 m), weight 152 lb (68.9 kg), SpO2 96 %.  Examination: General Appearance: The patient is well-developed, well-nourished, and in no distress. Skin: Gross inspection of skin unremarkable. Head: normocephalic, no gross deformities. Eyes: no gross deformities noted. ENT: ears appear grossly normal no exudates. Neck: Supple. No thyromegaly. No LAD. Respiratory: clear bilaterally. Cardiovascular: Normal S1 and S2 without murmur or rub. Extremities: No cyanosis. pulses are equal. Neurologic: Alert and oriented. No involuntary  movements.  LABS: Recent Results (from the past 2160 hour(s))  UA/M w/rflx Culture, Routine     Status: Abnormal   Collection Time: 11/06/18  9:17 AM   Specimen: Urine   URINE  Result Value Ref Range   Specific Gravity, UA 1.009 1.005 - 1.030   pH, UA 6.0 5.0 - 7.5   Color, UA Yellow Yellow   Appearance Ur Clear Clear   Leukocytes,UA 3+ (A) Negative   Protein,UA Negative Negative/Trace   Glucose, UA Negative Negative   Ketones, UA Negative Negative   RBC, UA Negative Negative   Bilirubin, UA Negative Negative   Urobilinogen, Ur 0.2 0.2 - 1.0 mg/dL   Nitrite, UA Negative Negative   Microscopic Examination See below:     Comment: Microscopic was indicated and was performed.   Urinalysis Reflex Comment     Comment: This specimen has reflexed to a Urine Culture.  Microscopic Examination     Status: Abnormal   Collection Time: 11/06/18  9:17 AM   URINE  Result Value Ref Range   WBC, UA 11-30 (A) 0 - 5 /hpf   RBC 0-2 0 - 2 /hpf   Epithelial Cells (non renal) 0-10 0 - 10 /hpf   Casts None seen None seen /lpf   Mucus, UA Present Not Estab.   Bacteria, UA Few None seen/Few  Urine Culture, Reflex     Status: None   Collection Time: 11/06/18  9:17 AM   URINE  Result Value Ref Range   Urine Culture, Routine Final report    Organism ID, Bacteria Comment     Comment: Mixed urogenital flora 50,000-100,000 colony forming units per mL   Comprehensive metabolic panel     Status: None   Collection Time: 12/06/18 10:34 AM  Result Value Ref Range   Glucose 96 65 - 99 mg/dL   BUN 8 8 - 27 mg/dL   Creatinine, Ser 0.61 0.57 - 1.00 mg/dL   GFR calc non Af Amer 90 >59 mL/min/1.73   GFR calc Af Amer 104 >59 mL/min/1.73   BUN/Creatinine Ratio 13 12 - 28   Sodium 136 134 - 144 mmol/L   Potassium 4.5 3.5 - 5.2 mmol/L   Chloride 96 96 - 106 mmol/L   CO2 27 20 - 29 mmol/L   Calcium 9.1 8.7 - 10.3 mg/dL   Total Protein 6.2 6.0 - 8.5 g/dL   Albumin 4.1 3.7 - 4.7 g/dL   Globulin, Total 2.1  1.5 - 4.5 g/dL   Albumin/Globulin Ratio 2.0 1.2 - 2.2   Bilirubin Total 0.4 0.0 - 1.2 mg/dL   Alkaline Phosphatase 69 39 - 117 IU/L   AST 12 0 - 40 IU/L   ALT 8 0 - 32 IU/L  CBC     Status: None   Collection Time: 12/06/18 10:34 AM  Result Value Ref Range   WBC 8.3 3.4 - 10.8 x10E3/uL   RBC 4.74 3.77 - 5.28 x10E6/uL   Hemoglobin 14.3 11.1 - 15.9 g/dL   Hematocrit 42.8 34.0 - 46.6 %   MCV 90 79 - 97 fL   MCH 30.2 26.6 - 33.0 pg   MCHC 33.4 31.5 - 35.7 g/dL   RDW 13.4 11.7 - 15.4 %   Platelets 293 150 - 450 x10E3/uL  Lipid Panel w/o Chol/HDL Ratio     Status: Abnormal   Collection Time: 12/06/18 10:34 AM  Result Value Ref Range   Cholesterol, Total 201 (H) 100 - 199 mg/dL   Triglycerides 77 0 - 149 mg/dL   HDL 77 >39 mg/dL   VLDL Cholesterol Cal 15 5 - 40 mg/dL   LDL Calculated 109 (H) 0 - 99 mg/dL  Hgb A1c w/o eAG     Status: Abnormal   Collection Time: 12/06/18 10:34 AM  Result Value Ref Range   Hgb A1c MFr Bld 6.0 (H) 4.8 - 5.6 %    Comment:          Prediabetes: 5.7 - 6.4          Diabetes: >6.4          Glycemic control for adults with diabetes: <7.0   T4, free     Status: None   Collection Time: 12/06/18 10:34 AM  Result Value Ref Range   Free T4 1.29 0.82 - 1.77 ng/dL  VITAMIN D 25 Hydroxy (Vit-D Deficiency, Fractures)     Status: Abnormal   Collection Time: 12/06/18 10:34 AM  Result Value Ref Range   Vit D, 25-Hydroxy 17.6 (L) 30.0 - 100.0 ng/mL    Comment: Vitamin D deficiency has been defined by the [place] practice guideline as a level of serum 25-OH vitamin D less than 20 ng/mL (1,2). The Endocrine Society went on to further define vitamin D insufficiency as a level between 21 and 29 ng/mL (2). 1. IOM (Institute of Medicine). 2010. Dietary reference    intakes for calcium and D. [place]: The    [employer]. 2. Holick MF, Binkley Ute, Bischoff-Ferrari HA, et al.    Evaluation, treatment, and  prevention of vitamin D    deficiency: an Endocrine Society clinical practice    guideline. JCEM. 2011 Jul; 96(7):1911-30.   T3     Status: None   Collection Time: 12/06/18 10:34 AM  Result Value Ref Range   T3, Total 118 71 - 180 ng/dL  Ferritin     Status: Abnormal   Collection Time: 12/06/18 10:34 AM  Result Value Ref Range   Ferritin 153 (H) 15 - 150 ng/mL    Radiology: Dg Chest 2 View  Result Date: 05/25/2016 CLINICAL DATA:  Chronic cough. EXAM: CHEST  2 VIEW COMPARISON:  02/24/2006 FINDINGS: Upper limits normal heart size noted. There is no evidence of focal airspace disease, pulmonary edema, suspicious pulmonary nodule/mass, pleural effusion, or pneumothorax. No acute bony abnormalities are identified. IMPRESSION: No active cardiopulmonary disease. Electronically Signed   By: Margarette Canada M.D.   On: 05/25/2016 13:20    No results found.  No results found.    Assessment and Plan: Patient Active Problem List   Diagnosis Date Noted  . Loud snoring 12/20/2018  . Dyspnea on exertion 09/03/2018  . Cigarette smoker 09/03/2018  . Epistaxis 08/14/2018  . Screening for breast cancer 11/09/2017  . Benign hypertension 11/09/2017  . Localized  superficial swelling, mass, or lump 11/09/2017  . Vitamin D deficiency 11/09/2017  . Dysuria 11/09/2017  . Closed fracture of clavicle 08/18/2016   1. OSA (obstructive sleep apnea) Get titration study to evaluate appropriate pressure for cpap device.  - Cpap titration; Future  2. Benign hypertension Stable, continue current therapy.  3. Cigarette smoker Smoking cessation counseling: 1. Pt acknowledges the risks of long term smoking, she will try to quite smoking. 2. Options for different medications including nicotine products, chewing gum, patch etc, Wellbutrin and Chantix is discussed 3. Goal and date of compete cessation is discussed 4. Total time spent in smoking cessation is 15 min.    General Counseling: I have discussed  the findings of the evaluation and examination with [facility].  I have also discussed any further diagnostic evaluation thatmay be needed or ordered today. Kei verbalizes understanding of the findings of todays visit. We also reviewed her medications today and discussed drug interactions and side effects including but not limited excessive drowsiness and altered mental states. We also discussed that there is always a risk not just to her but also people around her. she has been encouraged to call the office with any questions or concerns that should arise related to todays visit.    Time spent: 25 This patient was seen by Orson Gear AGNP-C in Collaboration with Dr. Devona Konig as a part of collaborative care agreement.   I have personally obtained a history, examined the patient, evaluated laboratory and imaging results, formulated the assessment and plan and placed orders.    Allyne Gee, MD [facility] Pulmonary and Critical Care Sleep medicine

## 2019-01-22 NOTE — Procedures (Signed)
[facility] [address], [place] [address]  Sleep Specialist: Allyne Gee, MD [facility] Sleep Study Interpretation  Patient Name: Hailey Wong Patient MR [id number] DOB:Feb 26, 1946  Date of Study: January 16, 2019  Indications for study: Hypersomnia excessive daytime somnolence obstructive sleep apnea  BMI: 28.3 kg/m       Respiratory Data:  Total AHI: 13.6/h  Total Obstructive Apneas: 2  Total Central Apneas: 0  Total Mixed Apneas: 0  Total Hypopneas: 34  If the AHI is greater than 5 per hour patient qualifies for PAP evaluation  Oximetry Data:  Oxygen Desaturation Index: 16/h  Lowest Desaturation: 72%  Cardiac Data:  Minimum Heart Rate: 47  Maximum Heart Rate: 88   Impression / Diagnosis:  This apnea study is consistent with mild obstructive sleep apnea.  Patient also has severe oxygen desaturations noted.  Patient also was noted to have bradycardic events.  It would be recommended to perform a CPAP titration study in the lab to better understand optimal pressure requirements.  Clinical correlation is recommended  GENERAL Recommendations:  1.  Consider Auto PAP with pressure ranges 5-20 cmH20 with download, or facility based PAP Titration Study  2.  Consider PAP interface mask fitted for patient comfort, Heated Humidification & PAP compliance monitoring (1 month, 3 months & 12 months after PAP initiation)  3. Consider treatment with mandibular advancement splint (MAS) or referral to an ENT surgeon for modification to the upper airway if the patient prefers an alternate therapy or the PAP trial is unsuccessful  4. Sleep hygiene measures should be discussed with the patient  5. Behavioral therapy such as weight reduction or smoking cessation as appropriate for the patient  6. Advise patient against the use of alcohol or sedatives in so much as these substances can worsen excessive daytime sleepiness and respiratory  disturbances of sleep  7. Advise patient against participating in potentially dangerous activities while drowsy such as operating a motor vehicle, heavy equipment or power tools as it can put them and others in danger  8. Advise patient of the long term consequences of OSA if left untreated, need for treatment and close follow up  9. Clinical follow up as deemed necessary     This Level III home sleep study was performed using the [employer], a 4 channel screening device subject to limitations. Depending on actual total sleep time, not measured in this study, the AHI (sum of apneas and hypopneas/hr of sleep) and therefore the severity of sleep apnea may be underestimated. As with any single night study, including Level 1 attended PSG, severity of sleep apnea may also be underestimated due to the lack of supine and/or REM sleep.  The interpretation associated with this report is based on normal values and degrees of severity in accordance with AASM parameters and/or estimated from multiple sources in the literature for adults ages 35-80+. These may not agree with the displayed values. The patient's treating physician should use the interpretation and recommendations in conjunction with the overall clinical evaluation and treatment of the patient.  Some of the terminology used in this scored [facility]nea[facility] report was developed several years ago and may not always be in accordance with current nomenclature. This in no way affects the accuracy of the data or the reliability of the interpretation and recommendations.

## 2019-02-06 ENCOUNTER — Encounter: Payer: Medicare Other | Admitting: Internal Medicine

## 2019-02-20 ENCOUNTER — Ambulatory Visit: Payer: Medicare Other | Admitting: Internal Medicine

## 2019-02-20 ENCOUNTER — Ambulatory Visit (INDEPENDENT_AMBULATORY_CARE_PROVIDER_SITE_OTHER): Payer: Medicare Other | Admitting: Internal Medicine

## 2019-02-20 DIAGNOSIS — G4733 Obstructive sleep apnea (adult) (pediatric): Secondary | ICD-10-CM

## 2019-02-28 NOTE — Procedures (Signed)
[facility] [address] [place], [place] [address]  Patient Name: Hailey Wong DOB: 03-02-1946   SLEEP STUDY INTERPRETATION  DATE OF SERVICE: February 20, 2019   SLEEP STUDY HISTORY: This patient is referred to the sleep lab for a baseline Polysomnography. Pertinent history includes a history of diagnosis of excessive daytime somnolence and snoring.  PROCEDURE: This overnight polysomnogram was performed using the [employer] 5 acquisition system using the standard diagnostic protocol as outlined by the AASM. This includes 6 channels of EEG, 2 channelscannels of EOG, chin EMG, bilateral anterior tibialis EMG, nasal/oral thermister, PTAF, chest and abdominal wall movements, ECG and pulse oximetry. Apneas and Hypopneas were scored per AASM definition.  SLEEP ARCHITECHTURE: This is a baseline polysomnograph  study. The total recording time was 402.0 minutes and the patients total sleep time is noted to be 190.0 minutes. Sleep onset latency was 140.5 minutes and is prolonged.  Stage R sleep onset latency was 115.5 minutes. Sleep maintenance efficiency was 47.3% and is decreased.  Sleep staging expressed as a percentage of total sleep time demonstrated 15.5% N1, 41.6% N2 and 38.4% N3  sleep. Stage R represents 4.5% of total sleep time. This is decreased.  There were a total of 22 arousals  for an overall arousal index of 6.9 per hour of sleep. PLMS arousal was not noted. Arousals without respiratory events are  noted. This can contribute to sleep architechture disruption.  RESPIRATORY MONITORING:   Patient exhibits some evidence of sleep disorderd breathing characterized by 3 central apneas, 2 obstructive apneas and 0 mixed apneas. There were 8 obstructive hypopneas and 0 RERAs. Most of the apneas/hypopneas were of obstructive variety. The total apnea hypopnea index (apneas and hypopneas per hour of sleep) is 4.1 respiratory events per hour and is within normal limits.  Respiratory  monitoring demonstrated mild snoring through the night. There are a total of 55 snoring episodes representing 15.8% of sleep.   Baseline oxygen saturation during wakefulness was 95% and during NREM sleep averaged 91% through the night. Arterial saturation during REM sleep was 91% through the night. There was significant  oxygen desaturation with the respiratory events. Arterial oxygen desaturation occurred of at least 4% was noted with a low saturation of 79%. The study was performed off oxygen.  CARDIAC MONITORING:   Average heart rate is 64 during sleep with a high of 93 beats per minute. Malignant arrhythmias were not noted.  CPAP titration: Patient was started on CPAP according to the sleep lab protocol.  Patient was started on a pressure of 5 and increased to a pressure of 8 cm water pressure.  Patient did appear to achieve adequate sleep on all pressures.  On a pressure of 8 cm water pressure patient slept for 31.2 minutes without exhibiting stage R which had been exhibited in the pressure before.  On this pressure there were no apneas nor hypopneas for a respiratory index of 0 and lowest saturation was 89%.  IMPRESSIONS:  --This overnight polysomnogram demonstrates no significant obstructive sleep apnea with an overall AHI 4.1 per hour. --The overall AHI was no worse worse  during Stage R. --There were associated some arterial oxygen desaturations noted with a low saturation of 85% --There is no significant significant PLMS noted in this study. --There is mild snoring noted throughout the study.    RECOMMENDATIONS:  --CPAP titration study is adequate to control this patient's obstructive sleep apnea the optimal pressure appears to be 8 cm water pressure.  Would recommend doing a  overnight oximetry on CPAP to determine optimal response and also need for oxygen.  Clinical correlation recommended. --Nasal decongestants and antihistamines may be of help for increased upper airways  resistance when present. --Weight loss through dietary and lifestyle modification is recommended in the presence of obesity. --A search for and treatment of any underlying cardiopulmonary disease is      recommended in the presence of oxygen desaturations. --Alternative treatment options if the patient is not willing to use CPAP include oral   appliances as well as surgical intervention which may help in the appropriate patient. --Clinical correlation is recommended. Please feel free to call the office for any further  questions or assistance in the care of this patient.     Allyne Gee, MD [facility] Pulmonary Critical Care Medicine Sleep medicine

## 2019-03-07 ENCOUNTER — Ambulatory Visit: Payer: Medicare Other | Admitting: Internal Medicine

## 2019-03-20 NOTE — Progress Notes (Signed)
Sleep medicine visit 04/01/2019

## 2019-03-28 ENCOUNTER — Telehealth: Payer: Self-pay

## 2019-03-28 NOTE — Telephone Encounter (Signed)
Confirmed appointment with patient. klh °

## 2019-04-01 ENCOUNTER — Ambulatory Visit: Payer: Medicare Other | Admitting: Internal Medicine

## 2019-04-01 ENCOUNTER — Other Ambulatory Visit: Payer: Self-pay

## 2019-04-01 ENCOUNTER — Encounter: Payer: Self-pay | Admitting: Internal Medicine

## 2019-04-01 VITALS — BP 151/78 | HR 65 | Temp 97.1°F | Resp 16 | Ht 61.0 in | Wt 150.0 lb

## 2019-04-01 DIAGNOSIS — I10 Essential (primary) hypertension: Secondary | ICD-10-CM | POA: Diagnosis not present

## 2019-04-01 DIAGNOSIS — G4733 Obstructive sleep apnea (adult) (pediatric): Secondary | ICD-10-CM

## 2019-04-01 NOTE — Progress Notes (Signed)
[facility] [place], [place] [address]  Pulmonary Sleep Medicine   Office Visit Note  Patient Name: Hailey Wong DOB: [birth year] MRN [id number]  Date of Service: 04/01/2019  Complaints/HPI: PT is here for follow up on cpap titration study.  Her study shows an optimal pressure of 8cm water is appropriate to resolve her symptoms.  She denies any new or worsening symptoms.  She is willing to wear cpap as discussed.   ROS  General: (-) fever, (-) chills, (-) night sweats, (-) weakness Skin: (-) rashes, (-) itching,. Eyes: (-) visual changes, (-) redness, (-) itching. Nose and Sinuses: (-) nasal stuffiness or itchiness, (-) postnasal drip, (-) nosebleeds, (-) sinus trouble. Mouth and Throat: (-) sore throat, (-) hoarseness. Neck: (-) swollen glands, (-) enlarged thyroid, (-) neck pain. Respiratory: - cough, (-) bloody sputum, - shortness of breath, - wheezing. Cardiovascular: - ankle swelling, (-) chest pain. Lymphatic: (-) lymph node enlargement. Neurologic: (-) numbness, (-) tingling. Psychiatric: (-) anxiety, (-) depression   Current Medication: Outpatient Encounter Medications as of 04/01/2019  Medication Sig  . atenolol (TENORMIN) 25 MG tablet Take 1 tablet (25 mg total) by mouth every evening.  . ergocalciferol (DRISDOL) 1.25 MG (50000 UT) capsule Take 1 capsule (50,000 Units total) by mouth once a week.  Marland Kitchen lisinopril (ZESTRIL) 10 MG tablet Take 1 tablet (10 mg total) by mouth daily.   No facility-administered encounter medications on file as of 04/01/2019.     Surgical History: Past Surgical History:  Procedure Laterality Date  . right ankle surgery  1997  . TUBAL LIGATION  1977    Medical History: Past Medical History:  Diagnosis Date  . Hypertension     Family History: History reviewed. No pertinent family history.  Social History: Social History   Socioeconomic History  . Marital status: Married    Spouse name: Not on file  .  Number of children: Not on file  . Years of education: Not on file  . Highest education level: Not on file  Occupational History  . Not on file  Social Needs  . Financial resource strain: Not on file  . Food insecurity    Worry: Not on file    Inability: Not on file  . Transportation needs    Medical: Not on file    Non-medical: Not on file  Tobacco Use  . Smoking status: Current Every Day Smoker    Types: Cigarettes  . Smokeless tobacco: Never Used  Substance and Sexual Activity  . Alcohol use: Yes    Alcohol/week: 1.0 standard drinks    Types: 1 Glasses of wine per week    Comment: wine daily  . Drug use: Never  . Sexual activity: Not on file  Lifestyle  . Physical activity    Days per week: Not on file    Minutes per session: Not on file  . Stress: Not on file  Relationships  . Social Herbalist on phone: Not on file    Gets together: Not on file    Attends religious service: Not on file    Active member of club or organization: Not on file    Attends meetings of clubs or organizations: Not on file    Relationship status: Not on file  . Intimate partner violence    Fear of current or ex partner: Not on file    Emotionally abused: Not on file    Physically abused: Not on file  Forced sexual activity: Not on file  Other Topics Concern  . Not on file  Social History Narrative  . Not on file    Vital Signs: Blood pressure (!) 151/78, pulse 65, temperature (!) 97.1 F (36.2 C), resp. rate 16, height 5\' 1"  (1.549 m), weight 150 lb (68 kg), SpO2 96 %.  Examination: General Appearance: The patient is well-developed, well-nourished, and in no distress. Skin: Gross inspection of skin unremarkable. Head: normocephalic, no gross deformities. Eyes: no gross deformities noted. ENT: ears appear grossly normal no exudates. Neck: Supple. No thyromegaly. No LAD. Respiratory: clear bilateraly. Cardiovascular: Normal S1 and S2 without murmur or  rub. Extremities: No cyanosis. pulses are equal. Neurologic: Alert and oriented. No involuntary movements.  LABS: No results found for this or any previous visit (from the past 2160 hour(s)).  Radiology: Dg Chest 2 View  Result Date: 05/25/2016 CLINICAL DATA:  Chronic cough. EXAM: CHEST  2 VIEW COMPARISON:  02/24/2006 FINDINGS: Upper limits normal heart size noted. There is no evidence of focal airspace disease, pulmonary edema, suspicious pulmonary nodule/mass, pleural effusion, or pneumothorax. No acute bony abnormalities are identified. IMPRESSION: No active cardiopulmonary disease. Electronically Signed   By: Margarette Canada M.D.   On: 05/25/2016 13:20    No results found.  No results found.    Assessment and Plan: Patient Active Problem List   Diagnosis Date Noted  . Loud snoring 12/20/2018  . Dyspnea on exertion 09/03/2018  . Cigarette smoker 09/03/2018  . Epistaxis 08/14/2018  . Screening for breast cancer 11/09/2017  . Benign hypertension 11/09/2017  . Localized superficial swelling, mass, or lump 11/09/2017  . Vitamin D deficiency 11/09/2017  . Dysuria 11/09/2017  . Closed fracture of clavicle 08/18/2016    1. OSA (obstructive sleep apnea) Order placed for cpap machine.  Will follow up with patient once machine is delivered and therapy has begun. - For home use only DME continuous positive airway pressure (CPAP)  2. Benign hypertension Slightly elevated 151/78, continue to monitor.   General Counseling: I have discussed the findings of the evaluation and examination with [facility].  I have also discussed any further diagnostic evaluation thatmay be needed or ordered today. Israel verbalizes understanding of the findings of todays visit. We also reviewed her medications today and discussed drug interactions and side effects including but not limited excessive drowsiness and altered mental states. We also discussed that there is always a risk not just to her but also people  around her. she has been encouraged to call the office with any questions or concerns that should arise related to todays visit.  No orders of the defined types were placed in this encounter.    Time spent: 15 This patient was seen by Orson Gear AGNP-C in Collaboration with Dr. Devona Konig as a part of collaborative care agreement.   I have personally obtained a history, examined the patient, evaluated laboratory and imaging results, formulated the assessment and plan and placed orders.    Allyne Gee, MD [facility] Pulmonary and Critical Care Sleep medicine

## 2019-06-05 DIAGNOSIS — H2513 Age-related nuclear cataract, bilateral: Secondary | ICD-10-CM | POA: Diagnosis not present

## 2019-06-18 ENCOUNTER — Telehealth: Payer: Self-pay | Admitting: Internal Medicine

## 2019-06-18 NOTE — Telephone Encounter (Signed)
Spoke with patient and followed up on CPAP machine benefits, pt does not want to go forward with cpap at this time and if she changes her decision then she will reach back out to schedule pulmonary. Beth

## 2019-07-10 DIAGNOSIS — H02834 Dermatochalasis of left upper eyelid: Secondary | ICD-10-CM | POA: Diagnosis not present

## 2019-07-10 DIAGNOSIS — H02831 Dermatochalasis of right upper eyelid: Secondary | ICD-10-CM | POA: Diagnosis not present

## 2019-08-02 ENCOUNTER — Other Ambulatory Visit: Payer: Self-pay

## 2019-08-02 DIAGNOSIS — I10 Essential (primary) hypertension: Secondary | ICD-10-CM

## 2019-08-02 MED ORDER — ATENOLOL 25 MG PO TABS: 25.0000 mg | ORAL_TABLET | Freq: Every evening | ORAL | 0 refills | Status: DC

## 2019-08-29 DIAGNOSIS — H02403 Unspecified ptosis of bilateral eyelids: Secondary | ICD-10-CM | POA: Diagnosis not present

## 2019-11-01 ENCOUNTER — Other Ambulatory Visit: Payer: Self-pay

## 2019-11-01 DIAGNOSIS — I10 Essential (primary) hypertension: Secondary | ICD-10-CM

## 2019-11-01 MED ORDER — ATENOLOL 25 MG PO TABS: 25.0000 mg | ORAL_TABLET | Freq: Every evening | ORAL | 0 refills | Status: DC

## 2019-11-06 ENCOUNTER — Telehealth: Payer: Self-pay

## 2019-11-06 NOTE — Telephone Encounter (Signed)
Confirmed and screened for 11-08-19 ov. 

## 2019-11-08 ENCOUNTER — Ambulatory Visit: Payer: Medicare Other | Admitting: Nurse Practitioner

## 2019-11-22 ENCOUNTER — Telehealth: Payer: Self-pay

## 2019-11-22 NOTE — Telephone Encounter (Signed)
Confirmed and screened for 11-26-19 ov. 

## 2019-11-25 DIAGNOSIS — I10 Essential (primary) hypertension: Secondary | ICD-10-CM | POA: Diagnosis not present

## 2019-11-26 ENCOUNTER — Encounter: Payer: Self-pay | Admitting: Internal Medicine

## 2019-11-26 ENCOUNTER — Ambulatory Visit (INDEPENDENT_AMBULATORY_CARE_PROVIDER_SITE_OTHER): Payer: Medicare PPO | Admitting: Internal Medicine

## 2019-11-26 ENCOUNTER — Other Ambulatory Visit: Payer: Self-pay

## 2019-11-26 ENCOUNTER — Ambulatory Visit
Admission: RE | Admit: 2019-11-26 | Discharge: 2019-11-26 | Disposition: A | Discharge status: Home / Self Care | Payer: Medicare PPO | Attending: Internal Medicine | Admitting: Internal Medicine

## 2019-11-26 ENCOUNTER — Ambulatory Visit
Admission: RE | Admit: 2019-11-26 | Discharge: 2019-11-26 | Disposition: A | Discharge status: Home / Self Care | Payer: Medicare PPO | Source: Ambulatory Visit | Attending: Internal Medicine | Admitting: Internal Medicine

## 2019-11-26 VITALS — BP 150/88 | HR 53 | Temp 97.3°F | Resp 16 | Ht 61.0 in | Wt 150.0 lb

## 2019-11-26 DIAGNOSIS — R0789 Other chest pain: Secondary | ICD-10-CM | POA: Diagnosis not present

## 2019-11-26 DIAGNOSIS — I10 Essential (primary) hypertension: Secondary | ICD-10-CM | POA: Insufficient documentation

## 2019-11-26 DIAGNOSIS — R931 Abnormal findings on diagnostic imaging of heart and coronary circulation: Secondary | ICD-10-CM

## 2019-11-26 DIAGNOSIS — G4733 Obstructive sleep apnea (adult) (pediatric): Secondary | ICD-10-CM | POA: Diagnosis not present

## 2019-11-26 DIAGNOSIS — Z0001 Encounter for general adult medical examination with abnormal findings: Secondary | ICD-10-CM

## 2019-11-26 DIAGNOSIS — R001 Bradycardia, unspecified: Secondary | ICD-10-CM

## 2019-11-26 DIAGNOSIS — R079 Chest pain, unspecified: Secondary | ICD-10-CM | POA: Diagnosis not present

## 2019-11-26 DIAGNOSIS — R300 Dysuria: Secondary | ICD-10-CM | POA: Diagnosis not present

## 2019-11-26 DIAGNOSIS — F17210 Nicotine dependence, cigarettes, uncomplicated: Secondary | ICD-10-CM | POA: Diagnosis not present

## 2019-11-26 NOTE — Progress Notes (Signed)
[facility] [place], [place] [address]  Internal MEDICINE  Office Visit Note  Patient Name: Hailey Wong  [id number]  [id number]  Date of Service: 12/03/2019  Chief Complaint  Patient presents with  . Medicare Wellness  . Hypertension  . Quality Metric Gaps    colonoscopy, covid-19 vaccine, tdap  . Chest Pain    has right side chest at times   HPI Pt is here for routine health maintenance examination. She is c/o chest pain on the right side, it comes and goes, no association with breathing or exertion. She does not want covid vaccine at this time. Continues to smoke. Takes all meds as prescribed, bp is elevated today and heart rate is low, she is on tenormin. Pt does gave h/o OSA, has refused CPAP  Current Medication: Outpatient Encounter Medications as of 11/26/2019  Medication Sig  . atenolol (TENORMIN) 25 MG tablet Take 1 tablet (25 mg total) by mouth every evening.  . ergocalciferol (DRISDOL) 1.25 MG (50000 UT) capsule Take 1 capsule (50,000 Units total) by mouth once a week.  Marland Kitchen lisinopril (ZESTRIL) 10 MG tablet Take 1 tablet (10 mg total) by mouth daily.   No facility-administered encounter medications on file as of 11/26/2019.   Social History   Socioeconomic History  . Marital status: Married    Spouse name: Not on file  . Number of children: Not on file  . Years of education: Not on file  . Highest education level: Not on file  Occupational History  . Not on file  Tobacco Use  . Smoking status: Current Every Day Smoker    Types: Cigarettes  . Smokeless tobacco: Never Used  Substance and Sexual Activity  . Alcohol use: Yes    Alcohol/week: 1.0 standard drink    Types: 1 Glasses of wine per week    Comment: wine daily  . Drug use: Never  . Sexual activity: Not on file  Other Topics Concern  . Not on file  Social History Narrative  . Not on file   Social Determinants of Health   Financial Resource Strain:   . Difficulty of Paying  Living Expenses:   Food Insecurity:   . Worried About Charity fundraiser in the Last Year:   . Arboriculturist in the Last Year:   Transportation Needs:   . Film/video editor (Medical):   Marland Kitchen Lack of Transportation (Non-Medical):   Physical Activity:   . Days of Exercise per Week:   . Minutes of Exercise per Session:   Stress:   . Feeling of Stress :   Social Connections:   . Frequency of Communication with Friends and Family:   . Frequency of Social Gatherings with Friends and Family:   . Attends Religious Services:   . Active Member of Clubs or Organizations:   . Attends Archivist Meetings:   Marland Kitchen Marital Status:   Intimate Partner Violence:   . Fear of Current or Ex-Partner:   . Emotionally Abused:   Marland Kitchen Physically Abused:   . Sexually Abused:    Surgical History: Past Surgical History:  Procedure Laterality Date  . right ankle surgery  1997  . TUBAL LIGATION  1977    Medical History: Past Medical History:  Diagnosis Date  . Hypertension     Family History: Family History  Problem Relation Age of Onset  . Hypertension Mother   . Pulmonary fibrosis Father   . Breast cancer Sister  Review of Systems  Constitutional: Negative for chills, diaphoresis and fatigue.  HENT: Negative for ear pain, postnasal drip and sinus pressure.   Eyes: Negative for photophobia, discharge, redness, itching and visual disturbance.  Respiratory: Negative for cough, shortness of breath and wheezing.   Cardiovascular: Positive for chest pain. Negative for palpitations and leg swelling.  Gastrointestinal: Negative for abdominal pain, constipation, diarrhea, nausea and vomiting.  Genitourinary: Negative for dysuria and flank pain.  Musculoskeletal: Negative for arthralgias, back pain, gait problem and neck pain.  Skin: Negative for color change.  Allergic/Immunologic: Negative for environmental allergies and food allergies.  Neurological: Negative for dizziness and  headaches.  Hematological: Does not bruise/bleed easily.  Psychiatric/Behavioral: Negative for agitation, behavioral problems (depression) and hallucinations.     Vital Signs: BP (!) 150/88   Pulse (!) 53   Temp (!) 97.3 F (36.3 C)   Resp 16   Ht 5\' 1"  (1.549 m)   Wt 150 lb (68 kg)   SpO2 98%   BMI 28.34 kg/m    Physical Exam Constitutional:      General: She is not in acute distress.    Appearance: She is well-developed. She is not diaphoretic.  HENT:     Head: Normocephalic and atraumatic.     Mouth/Throat:     Pharynx: No oropharyngeal exudate.  Eyes:     Pupils: Pupils are equal, round, and reactive to light.  Neck:     Thyroid: No thyromegaly.     Vascular: No JVD.     Trachea: No tracheal deviation.  Cardiovascular:     Rate and Rhythm: Normal rate and regular rhythm.     Heart sounds: Normal heart sounds. No murmur heard.  No friction rub. No gallop.   Pulmonary:     Effort: Pulmonary effort is normal. No respiratory distress.     Breath sounds: No wheezing or rales.  Chest:     Chest wall: No tenderness.     Breasts:        Right: Normal.        Left: Normal.  Abdominal:     General: Bowel sounds are normal.     Palpations: Abdomen is soft.  Musculoskeletal:        General: Normal range of motion.     Cervical back: Normal range of motion and neck supple.  Lymphadenopathy:     Cervical: No cervical adenopathy.  Skin:    General: Skin is warm and dry.  Neurological:     Mental Status: She is alert and oriented to person, place, and time.     Cranial Nerves: No cranial nerve deficit.  Psychiatric:        Behavior: Behavior normal.        Thought Content: Thought content normal.        Judgment: Judgment normal.    Assessment/Plan: 1. Encounter for general adult medical examination with abnormal findings - Update albs and other phm  2. Benign hypertension - Uncontrolled HTN, LVH on echo. Will dc tenormin and increase Lisinopril to 10 mg 2  tabs a day   3. OSA (obstructive sleep apnea) - Has declined her CPAP - Pulse oximetry, overnight; Future  4. Cigarette nicotine dependence without complication - Pt is slowing going down on number of cigarette - DG Chest 2 View; Future  5. Other chest pain - DG Chest 2 View; Future, will review, does have evidence of arthrosclerosis, might need carotid dopplers   6. Abnormal echocardiogram - Will need  monitoring after overnight pox   7. Bradycardia - DC Tenormin for now  8. Dysuria - UA/M w/rflx Culture, Routine - Microscopic Examination - Urine Culture, Reflex  General Counseling: Verdia verbalizes understanding of the findings of todays visit and agrees with plan of treatment. I have discussed any further diagnostic evaluation that may be needed or ordered today. We also reviewed her medications today. she has been encouraged to call the office with any questions or concerns that should arise related to todays visit.  Counseling: Smoking cessation counseling: 1. Pt acknowledges the risks of long term smoking, she will try to quite smoking. 2. Options for different medications including nicotine products, chewing gum, patch etc, Wellbutrin and Chantix is discussed 3. Goal and date of compete cessation is discussed 4. Total time spent in smoking cessation is 10 min.  Orders Placed This Encounter  Procedures  . Microscopic Examination  . Urine Culture, Reflex  . DG Chest 2 View  . UA/M w/rflx Culture, Routine  . Pulse oximetry, overnight     Total time spent: 35 Minutes  Time spent includes review of chart, medications, test results, and follow up plan with the patient.   Lavera Guise, MD  Internal Medicine

## 2019-12-01 LAB — UA/M W/RFLX CULTURE, ROUTINE
Bilirubin, UA: NEGATIVE
Glucose, UA: NEGATIVE
Ketones, UA: NEGATIVE
Nitrite, UA: NEGATIVE
Protein,UA: NEGATIVE
RBC, UA: NEGATIVE
Specific Gravity, UA: 1.008 (ref 1.005–1.030)
Urobilinogen, Ur: 0.2 mg/dL (ref 0.2–1.0)
pH, UA: 7 (ref 5.0–7.5)

## 2019-12-01 LAB — MICROSCOPIC EXAMINATION
Bacteria, UA: NONE SEEN
Casts: NONE SEEN /lpf
Epithelial Cells (non renal): NONE SEEN /hpf (ref 0–10)
RBC, Urine: NONE SEEN /hpf (ref 0–2)
WBC, UA: NONE SEEN /hpf (ref 0–5)

## 2019-12-01 LAB — URINE CULTURE, REFLEX

## 2019-12-04 NOTE — Progress Notes (Signed)
Patient sees taylor on 01/09/2020. Mayve get carotids at visit.

## 2019-12-06 ENCOUNTER — Encounter: Payer: Self-pay | Admitting: Ophthalmology

## 2019-12-11 ENCOUNTER — Other Ambulatory Visit
Admission: RE | Admit: 2019-12-11 | Discharge: 2019-12-11 | Disposition: A | Discharge status: Home / Self Care | Payer: Medicare PPO | Source: Ambulatory Visit | Attending: Ophthalmology | Admitting: Ophthalmology

## 2019-12-11 ENCOUNTER — Other Ambulatory Visit: Payer: Self-pay

## 2019-12-11 DIAGNOSIS — Z01812 Encounter for preprocedural laboratory examination: Secondary | ICD-10-CM | POA: Insufficient documentation

## 2019-12-11 DIAGNOSIS — Z20822 Contact with and (suspected) exposure to covid-19: Secondary | ICD-10-CM | POA: Diagnosis not present

## 2019-12-11 DIAGNOSIS — I10 Essential (primary) hypertension: Secondary | ICD-10-CM

## 2019-12-11 LAB — SARS CORONAVIRUS 2 (TAT 6-24 HRS): SARS Coronavirus 2: NEGATIVE

## 2019-12-11 MED ORDER — LISINOPRIL 10 MG PO TABS: 10.0000 mg | ORAL_TABLET | Freq: Two times a day (BID) | ORAL | 0 refills | Status: DC

## 2019-12-11 NOTE — Discharge Instructions (Signed)
INSTRUCTIONS FOLLOWING OCULOPLASTIC SURGERY AMY M. FOWLER, MD  AFTER YOUR EYE SURGERY, THER ARE MANY THINGS WHICH YOU, THE PATIENT, CAN DO TO ASSURE THE BEST POSSIBLE RESULT FROM YOUR OPERATION.  THIS SHEET SHOULD BE REFERRED TO WHENEVER QUESTIONS ARISE.  IF THERE ARE ANY QUESTIONS NOT ANSWERED HERE, DO NOT HESITATE TO CALL OUR OFFICE AT [phone] OR [phone].  THERE IS ALWAYS SOMEONE AVAILABLE TO CALL IF QUESTIONS OR PROBLEMS ARISE.  VISION: Your vision may be blurred and out of focus after surgery until you are able to stop using your ointment, swelling resolves and your eye(s) heal. This may take 1 to 2 weeks at the least.  If your vision becomes gradually more dim or dark, this is not normal and you need to call our office immediately.  EYE CARE: For the first 48 hours after surgery, use ice packs frequently - "20 minutes on, 20 minutes off" - to help reduce swelling and bruising.  Small bags of frozen peas or corn make good ice packs along with cloths soaked in ice water.  If you are wearing a patch or other type of dressing following surgery, keep this on for the amount of time specified by your doctor.  For the first week following surgery, you will need to treat your stitches with great care.  It is OK to shower, but take care to not allow soapy water to run into your eye(s) to help reduce chances of infection.  You may gently clean the eyelashes and around the eye(s) with cotton balls and sterile water, BUT DO NOT RUB THE STITCHES VIGOROUSLY.  Keeping your stitches moist with ointment will help promote healing with minimal scar formation.  ACTIVITY: When you leave the surgery center, you should go home, rest and be inactive.  The eye(s) may feel scratchy and keeping the eyes closed will allow for faster healing.  The first week following surgery, avoid straining (anything making the face turn red) or lifting over 20 pounds.  Additionally, avoid bending which causes your head to go below  your waist.  Using your eyes will NOT harm them, so feel free to read, watch television, use the computer, etc as desired.  Driving depends on each individual, so check with your doctor if you have questions about driving. Do not wear contact lenses for about 2 weeks.  Do not wear eye makeup for 2 weeks.  Avoid swimming, hot tubs, gardening, and dusting for 1 to 2 weeks to reduce the risk of an infection.  MEDICATIONS:  You will be given a prescription for an ointment to use 4 times a day on your stitches.  You can use the ointment in your eyes if they feel scratchy or irritated.  If you eyelid(s) don't close completely when you sleep, put some ointment in your eyes before bedtime.  EMERGENCY: If you experience SEVERE EYE PAIN OR HEADACHE UNRELIEVED BY TYLENOL OR TRAMADOL, NAUSEA OR VOMITING, WORSENING REDNESS, OR WORSENING VISION (ESPECIALLY VISION THAT WAS INITIALLY BETTER) CALL [phone] OR [phone] DURING BUSINESS HOURS OR AFTER HOURS.  General Anesthesia, Adult, Care After This sheet gives you information about how to care for yourself after your procedure. Your health care provider may also give you more specific instructions. If you have problems or questions, contact your health care provider. What can I expect after the procedure? After the procedure, the following side effects are common:  Pain or discomfort at the IV site.  Nausea.  Vomiting.  Sore throat.  Trouble concentrating.    Feeling cold or chills.  Weak or tired.  Sleepiness and fatigue.  Soreness and body aches. These side effects can affect parts of the body that were not involved in surgery. Follow these instructions at home:  For at least 24 hours after the procedure:  Have a responsible adult stay with you. It is important to have someone help care for you until you are awake and alert.  Rest as needed.  Do not: ? Participate in activities in which you could fall or become injured. ? Drive. ? Use  heavy machinery. ? Drink alcohol. ? Take sleeping pills or medicines that cause drowsiness. ? Make important decisions or sign legal documents. ? Take care of children on your own. Eating and drinking  Follow any instructions from your health care provider about eating or drinking restrictions.  When you feel hungry, start by eating small amounts of foods that are soft and easy to digest (bland), such as toast. Gradually return to your regular diet.  Drink enough fluid to keep your urine pale yellow.  If you vomit, rehydrate by drinking water, juice, or clear broth. General instructions  If you have sleep apnea, surgery and certain medicines can increase your risk for breathing problems. Follow instructions from your health care provider about wearing your sleep device: ? Anytime you are sleeping, including during daytime naps. ? While taking prescription pain medicines, sleeping medicines, or medicines that make you drowsy.  Return to your normal activities as told by your health care provider. Ask your health care provider what activities are safe for you.  Take over-the-counter and prescription medicines only as told by your health care provider.  If you smoke, do not smoke without supervision.  Keep all follow-up visits as told by your health care provider. This is important. Contact a health care provider if:  You have nausea or vomiting that does not get better with medicine.  You cannot eat or drink without vomiting.  You have pain that does not get better with medicine.  You are unable to pass urine.  You develop a skin rash.  You have a fever.  You have redness around your IV site that gets worse. Get help right away if:  You have difficulty breathing.  You have chest pain.  You have blood in your urine or stool, or you vomit blood. Summary  After the procedure, it is common to have a sore throat or nausea. It is also common to feel tired.  Have a  responsible adult stay with you for the first 24 hours after general anesthesia. It is important to have someone help care for you until you are awake and alert.  When you feel hungry, start by eating small amounts of foods that are soft and easy to digest (bland), such as toast. Gradually return to your regular diet.  Drink enough fluid to keep your urine pale yellow.  Return to your normal activities as told by your health care provider. Ask your health care provider what activities are safe for you. This information is not intended to replace advice given to you by your health care provider. Make sure you discuss any questions you have with your health care provider. Document Revised: 04/14/2017 Document Reviewed: 11/25/2016 Elsevier Patient Education  2020 [employer]  

## 2019-12-13 ENCOUNTER — Ambulatory Visit
Admission: RE | Admit: 2019-12-13 | Discharge: 2019-12-13 | Disposition: A | Discharge status: Home / Self Care | Payer: Medicare PPO | Attending: Ophthalmology | Admitting: Ophthalmology

## 2019-12-13 ENCOUNTER — Encounter
Admission: RE | Disposition: A | Discharge status: Home / Self Care | Payer: Self-pay | Source: Home / Self Care | Attending: Ophthalmology

## 2019-12-13 ENCOUNTER — Other Ambulatory Visit: Payer: Self-pay

## 2019-12-13 ENCOUNTER — Encounter: Payer: Self-pay | Admitting: Ophthalmology

## 2019-12-13 ENCOUNTER — Ambulatory Visit: Payer: Medicare PPO | Admitting: Anesthesiology

## 2019-12-13 DIAGNOSIS — F17200 Nicotine dependence, unspecified, uncomplicated: Secondary | ICD-10-CM | POA: Diagnosis not present

## 2019-12-13 DIAGNOSIS — H02831 Dermatochalasis of right upper eyelid: Secondary | ICD-10-CM | POA: Diagnosis not present

## 2019-12-13 DIAGNOSIS — I10 Essential (primary) hypertension: Secondary | ICD-10-CM | POA: Insufficient documentation

## 2019-12-13 DIAGNOSIS — H02834 Dermatochalasis of left upper eyelid: Secondary | ICD-10-CM | POA: Diagnosis not present

## 2019-12-13 DIAGNOSIS — H02403 Unspecified ptosis of bilateral eyelids: Secondary | ICD-10-CM | POA: Diagnosis not present

## 2019-12-13 DIAGNOSIS — R05 Cough: Secondary | ICD-10-CM | POA: Diagnosis not present

## 2019-12-13 DIAGNOSIS — G4730 Sleep apnea, unspecified: Secondary | ICD-10-CM | POA: Diagnosis not present

## 2019-12-13 DIAGNOSIS — I517 Cardiomegaly: Secondary | ICD-10-CM | POA: Insufficient documentation

## 2019-12-13 HISTORY — DX: Cardiomegaly: I51.7

## 2019-12-13 HISTORY — DX: Chronic cough: R05.3

## 2019-12-13 HISTORY — PX: BROW LIFT: SHX178

## 2019-12-13 HISTORY — DX: Unspecified disorder of nose and nasal sinuses: J34.9

## 2019-12-13 HISTORY — DX: Sleep apnea, unspecified: G47.30

## 2019-12-13 SURGERY — BLEPHAROPLASTY
Anesthesia: Monitor Anesthesia Care | Site: Eye | Laterality: Bilateral

## 2019-12-13 MED ORDER — TETRACAINE HCL 0.5 % OP SOLN
OPHTHALMIC | Status: DC | PRN
  Administered 2019-12-13: 2 [drp] via OPHTHALMIC

## 2019-12-13 MED ORDER — ONDANSETRON HCL 4 MG/2ML IJ SOLN
INTRAMUSCULAR | Status: DC | PRN
  Administered 2019-12-13: 4 mg via INTRAVENOUS

## 2019-12-13 MED ORDER — ACETAMINOPHEN 160 MG/5ML PO SOLN: 325.0000 mg | Freq: Once | ORAL | Status: DC

## 2019-12-13 MED ORDER — ERYTHROMYCIN 5 MG/GM OP OINT
TOPICAL_OINTMENT | OPHTHALMIC | Status: DC | PRN
  Administered 2019-12-13: 1 via OPHTHALMIC

## 2019-12-13 MED ORDER — MIDAZOLAM HCL 2 MG/2ML IJ SOLN
INTRAMUSCULAR | Status: DC | PRN
  Administered 2019-12-13: 1 mg via INTRAVENOUS

## 2019-12-13 MED ORDER — PROPOFOL 500 MG/50ML IV EMUL
INTRAVENOUS | Status: DC | PRN
  Administered 2019-12-13: 25 ug/kg/min via INTRAVENOUS

## 2019-12-13 MED ORDER — LACTATED RINGERS IV SOLN: INTRAVENOUS | Status: DC

## 2019-12-13 MED ORDER — TRAMADOL HCL 50 MG PO TABS: ORAL_TABLET | ORAL | 0 refills | Status: DC

## 2019-12-13 MED ORDER — ACETAMINOPHEN 325 MG PO TABS: 325.0000 mg | ORAL_TABLET | Freq: Once | ORAL | Status: DC

## 2019-12-13 MED ORDER — ALFENTANIL 500 MCG/ML IJ INJ
INJECTION | INTRAVENOUS | Status: DC | PRN
  Administered 2019-12-13: 500 ug via INTRAVENOUS

## 2019-12-13 MED ORDER — LIDOCAINE HCL (CARDIAC) PF 100 MG/5ML IV SOSY
PREFILLED_SYRINGE | INTRAVENOUS | Status: DC | PRN
  Administered 2019-12-13: 40 mg via INTRAVENOUS

## 2019-12-13 MED ORDER — LIDOCAINE-EPINEPHRINE 2 %-1:100000 IJ SOLN
INTRAMUSCULAR | Status: DC | PRN
  Administered 2019-12-13: 2.5 mL via OPHTHALMIC
  Administered 2019-12-13 (×2): .5 mL via OPHTHALMIC

## 2019-12-13 MED ORDER — BSS IO SOLN
INTRAOCULAR | Status: DC | PRN
  Administered 2019-12-13: 4 mL

## 2019-12-13 MED ORDER — ERYTHROMYCIN 5 MG/GM OP OINT: TOPICAL_OINTMENT | OPHTHALMIC | 2 refills | Status: DC

## 2019-12-13 SURGICAL SUPPLY — 37 items
APPLICATOR COTTON TIP WD 3 STR (MISCELLANEOUS) ×3 IMPLANT
BLADE SURG 15 STRL LF DISP TIS (BLADE) ×1 IMPLANT
BLADE SURG 15 STRL SS (BLADE) ×3
CORD BIP STRL DISP 12FT (MISCELLANEOUS) ×3 IMPLANT
DRAPE HEAD BAR (DRAPES) ×3 IMPLANT
GAUZE SPONGE 4X4 12PLY STRL (GAUZE/BANDAGES/DRESSINGS) ×3 IMPLANT
GLOVE SURG LX 7.0 MICRO (GLOVE) ×4
GLOVE SURG LX STRL 7.0 MICRO (GLOVE) ×2 IMPLANT
GOWN STRL REUS W/ TWL LRG LVL3 (GOWN DISPOSABLE) ×1 IMPLANT
GOWN STRL REUS W/TWL LRG LVL3 (GOWN DISPOSABLE) ×3
MARKER SKIN XFINE TIP W/RULER (MISCELLANEOUS) ×3 IMPLANT
NEEDLE FILTER BLUNT 18X 1/2SAF (NEEDLE) ×2
NEEDLE FILTER BLUNT 18X1 1/2 (NEEDLE) ×1 IMPLANT
NEEDLE HYPO 30X.5 LL (NEEDLE) ×6 IMPLANT
PACK ENT CUSTOM (PACKS) ×3 IMPLANT
SOL PREP PVP 2OZ (MISCELLANEOUS) ×3
SOLUTION PREP PVP 2OZ (MISCELLANEOUS) ×1 IMPLANT
SPONGE GAUZE 2X2 8PLY STER LF (GAUZE/BANDAGES/DRESSINGS) ×10
SPONGE GAUZE 2X2 8PLY STRL LF (GAUZE/BANDAGES/DRESSINGS) ×20 IMPLANT
SUT CHROMIC 4-0 (SUTURE)
SUT CHROMIC 4-0 M2 12X2 ARM (SUTURE)
SUT CHROMIC 5 0 P 3 (SUTURE) IMPLANT
SUT ETHILON 4 0 CL P 3 (SUTURE) IMPLANT
SUT GUT PLAIN 6-0 1X18 ABS (SUTURE) ×3 IMPLANT
SUT MERSILENE 4-0 S-2 (SUTURE) IMPLANT
SUT PROLENE 5 0 P 3 (SUTURE) IMPLANT
SUT PROLENE 6 0 P 1 18 (SUTURE) ×6 IMPLANT
SUT SILK 4 0 G 3 (SUTURE) IMPLANT
SUT VIC AB 5-0 P-3 18X BRD (SUTURE) IMPLANT
SUT VIC AB 5-0 P3 18 (SUTURE)
SUT VICRYL 6-0  S14 CTD (SUTURE)
SUT VICRYL 6-0 S14 CTD (SUTURE) IMPLANT
SUT VICRYL 7 0 TG140 8 (SUTURE) IMPLANT
SUTURE CHRMC 4-0 M2 12X2 ARM (SUTURE) IMPLANT
SYR 10ML LL (SYRINGE) ×3 IMPLANT
SYR 3ML LL SCALE MARK (SYRINGE) ×3 IMPLANT
WATER STERILE IRR 250ML POUR (IV SOLUTION) ×3 IMPLANT

## 2019-12-13 NOTE — Op Note (Signed)
Preoperative Diagnosis:  1. Visually significant blepharoptosis bilateral  Upper Eyelid(s) 2. Visually significant dermatochalasis bilateral  Upper Eyelid(s)  Postoperative Diagnosis:  Same.  Procedure(s) Performed:   1. Blepharoptosis repair with levator aponeurosis advancement bilateral  Upper Eyelid(s) 2. Upper eyelid blepharoplasty with excess skin excision  bilateral  Upper Eyelid(s)  Surgeon: Philis Pique. Vickki Muff, M.D.  Assistants: none  Anesthesia: MAC  Specimens: None.  Estimated Blood Loss: Minimal.  Complications: None.  Operative Findings: None Dictated  Procedure:   Allergies were reviewed and the patient Patient has no known allergies..   After the risks, benefits, complications and alternatives were discussed with the patient, appropriate informed consent was obtained.  While seated in an upright position and looking in primary gaze, the mid pupillary line was marked on the upper eyelid margins bilaterally. The patient was then brought to the operating suite and reclined supine.  Timeout was conducted and the patient was sedated.  Local anesthetic consisting of a 50-50 mixture of 2% lidocaine with epinephrine and 0.75% bupivacaine with added Hylenex was injected subcutaneously to both  upper eyelid(s). After adequate local was instilled, the patient was prepped and draped in the usual sterile fashion for eyelid surgery.   Attention was turned to the upper eyelids. A 50m upper eyelid crease incision line was marked with calipers on both  upper eyelid(s).  A pinch test was used to estimate the amount of excess skin to remove and this was marked in standard blepharoplasty style fashion. Attention was turned to the  right  upper eyelid. A #15 blade was used to open the premarked incision line. A Skin and muscle flap was excised and hemostasis was obtained with bipolar cautery.   A buttonhole was created centrally in the orbital septum to reveal the central fat pocket. This was  dissected free from fascial attachments, cauterized towards the pedicle base and excised to produce a nice flattening of the upper eyelid.  Dissection of the medial fat pocket was difficult and not aggressively pursued.   Westcott scissors were then used to transect through orbicularis down to the tarsal plate. Epitarsus was dissected to create a smooth surface to suture to. Dissection was then carried superiorly in the plane between orbicularis and orbital septum. Once the preaponeurotic fat pocket was identified, the orbital septum was opened. This revealed the levator and its aponeurosis.    Attention was then turned to the opposite eyelid where the same procedure was performed in the same manner. Hemostasis was obtained with bipolar cautery throughout.   3 interrupted 6-0 Prolene sutures were then passed partial thickness through the tarsal plates of both  upper eyelid(s). These sutures were placed in line with the mid pupillary, medial limbal, and lateral limbal lines. The sutures were fixed to the levator aponeurosis and adjusted until a nice lid height and contour were achieved. Once nice symmetry was achieved, the skin incisions were closed with a running 6-0 plain gut suture. The patient tolerated the procedure well.  Erythromycin ophthalmic ophthalmic ointment was applied to the incision site(s) followed by ice packs. The patient was taken to the recovery area where she recovered without difficulty.  Post-Op Plan/Instructions:   The patient was instructed to use ice packs frequently for the next 48 hours. She was instructed to use Erythromycin ophthalmic ophthalmic ointment on her incisions 4 times a day for the next 12 to 14 days. Shewas given a prescription for tramadol (or similar) for pain control should Tylenol not be effective. She was asked to  to follow up at the [facility] in [place], [place] in 2-3 weeks' time or sooner as needed for problems.  Adriona Kaney M. Vickki Muff,  M.D. Ophthalmology

## 2019-12-13 NOTE — Interval H&P Note (Signed)
History and Physical Interval Note:  12/13/2019 8:43 AM  Hailey Wong  has presented today for surgery, with the diagnosis of H02.831 Dermatochalasis of Right Upper Eyelid H02.834 Dermatochalasis of Left Upper Eyelid H02.403 Ptosis of Eyelid, Unspecified.  The various methods of treatment have been discussed with the patient and family. After consideration of risks, benefits and other options for treatment, the patient has consented to  Procedure(s): BLEPHAROPLASTY UPPER EYELID; W/EXCESS SKIN BLEPHAROPTOSIS REPAIR; RESECT EX (Bilateral) as a surgical intervention.  The patient's history has been reviewed, patient examined, no change in status, stable for surgery.  I have reviewed the patient's chart and labs.  Questions were answered to the patient's satisfaction.     Vickki Muff, Linwood Gullikson M

## 2019-12-13 NOTE — Anesthesia Preprocedure Evaluation (Signed)
Anesthesia Evaluation  Patient identified by MRN, date of birth, ID band Patient awake    Reviewed: Allergy & Precautions, H&P , NPO status , Patient's Chart, lab work & pertinent test results  Airway Mallampati: II  TM Distance: >3 FB Neck ROM: full    Dental no notable dental hx.    Pulmonary sleep apnea , Current Smoker and Patient abstained from smoking.,    Pulmonary exam normal breath sounds clear to auscultation       Cardiovascular hypertension, Normal cardiovascular exam Rhythm:regular Rate:Normal     Neuro/Psych    GI/Hepatic   Endo/Other    Renal/GU      Musculoskeletal   Abdominal   Peds  Hematology   Anesthesia Other Findings   Reproductive/Obstetrics                             Anesthesia Physical Anesthesia Plan  ASA: II  Anesthesia Plan: MAC   Post-op Pain Management:    Induction:   PONV Risk Score and Plan: 2 and Treatment may vary due to age or medical condition, TIVA and Midazolam  Airway Management Planned:   Additional Equipment:   Intra-op Plan:   Post-operative Plan:   Informed Consent: I have reviewed the patients History and Physical, chart, labs and discussed the procedure including the risks, benefits and alternatives for the proposed anesthesia with the patient or authorized representative who has indicated his/her understanding and acceptance.     Dental Advisory Given  Plan Discussed with: CRNA  Anesthesia Plan Comments:         Anesthesia Quick Evaluation

## 2019-12-13 NOTE — Transfer of Care (Signed)
Immediate Anesthesia Transfer of Care Note  Patient: Hailey Wong  Procedure(s) Performed: BLEPHAROPLASTY UPPER EYELID; W/EXCESS SKIN BLEPHAROPTOSIS REPAIR; RESECT EX (Bilateral Eye)  Patient Location: PACU  Anesthesia Type: MAC  Level of Consciousness: awake, alert  and patient cooperative  Airway and Oxygen Therapy: Patient Spontanous Breathing and Patient connected to supplemental oxygen  Post-op Assessment: Post-op Vital signs reviewed, Patient's Cardiovascular Status Stable, Respiratory Function Stable, Patent Airway and No signs of Nausea or vomiting  Post-op Vital Signs: Reviewed and stable  Complications: No complications documented.

## 2019-12-13 NOTE — H&P (Signed)
  See the history and physical completed at [facility] on 11/25/19 and scanned into the chart.

## 2019-12-13 NOTE — Anesthesia Procedure Notes (Signed)
Procedure Name: MAC Date/Time: 12/13/2019 9:08 AM Performed by: Silvana Newness, CRNA Pre-anesthesia Checklist: Patient identified, Emergency Drugs available, Suction available, Patient being monitored and Timeout performed Patient Re-evaluated:Patient Re-evaluated prior to induction Oxygen Delivery Method: Nasal cannula Placement Confirmation: positive ETCO2

## 2019-12-13 NOTE — Anesthesia Postprocedure Evaluation (Signed)
Anesthesia Post Note  Patient: KHAMARI YOUSUF  Procedure(s) Performed: BLEPHAROPLASTY UPPER EYELID; W/EXCESS SKIN BLEPHAROPTOSIS REPAIR; RESECT EX (Bilateral Eye)     Patient location during evaluation: PACU Anesthesia Type: MAC Level of consciousness: awake and alert and oriented Pain management: satisfactory to patient Vital Signs Assessment: post-procedure vital signs reviewed and stable Respiratory status: spontaneous breathing, nonlabored ventilation and respiratory function stable Cardiovascular status: blood pressure returned to baseline and stable Postop Assessment: Adequate PO intake and No signs of nausea or vomiting Anesthetic complications: no   No complications documented.  Raliegh Ip

## 2019-12-16 ENCOUNTER — Encounter: Payer: Self-pay | Admitting: Ophthalmology

## 2020-01-07 ENCOUNTER — Telehealth: Payer: Self-pay

## 2020-01-07 NOTE — Telephone Encounter (Signed)
Confirmed and screened for OV on 9/16

## 2020-01-09 ENCOUNTER — Encounter: Payer: Self-pay | Admitting: Hospice and Palliative Medicine

## 2020-01-09 ENCOUNTER — Ambulatory Visit (INDEPENDENT_AMBULATORY_CARE_PROVIDER_SITE_OTHER): Payer: Medicare PPO | Admitting: Hospice and Palliative Medicine

## 2020-01-09 ENCOUNTER — Other Ambulatory Visit: Payer: Self-pay

## 2020-01-09 VITALS — BP 140/92 | HR 78 | Temp 98.2°F | Resp 16 | Ht 61.0 in | Wt 156.3 lb

## 2020-01-09 DIAGNOSIS — I10 Essential (primary) hypertension: Secondary | ICD-10-CM | POA: Diagnosis not present

## 2020-01-09 DIAGNOSIS — F17210 Nicotine dependence, cigarettes, uncomplicated: Secondary | ICD-10-CM | POA: Diagnosis not present

## 2020-01-09 DIAGNOSIS — Z1211 Encounter for screening for malignant neoplasm of colon: Secondary | ICD-10-CM

## 2020-01-09 DIAGNOSIS — G4733 Obstructive sleep apnea (adult) (pediatric): Secondary | ICD-10-CM

## 2020-01-09 DIAGNOSIS — Z0001 Encounter for general adult medical examination with abnormal findings: Secondary | ICD-10-CM

## 2020-01-09 MED ORDER — HYDROCHLOROTHIAZIDE 12.5 MG PO TABS: 12.5000 mg | ORAL_TABLET | Freq: Every day | ORAL | 0 refills | Status: DC

## 2020-01-09 NOTE — Progress Notes (Signed)
[facility] [place], [place] [address]  Internal MEDICINE  Office Visit Note  Patient Name: Hailey Wong  [id number]  [id number]  Date of Service: 01/11/2020  Chief Complaint  Patient presents with  . Follow-up    wants to check bp  . Hypertension  . Sleep Apnea  . Quality Metric Gaps    HepC, TDAP, PNA, colonoscopy, mammogram, covid vaccine    HPI Patient is here today for routine follow-up She is interested in discussing her BP today, her tenormin was discontinued at her last appointment due to a low heart rate She has been checking her BP and HR twice a day at home We had an in-depth discussion about the meaning of BP and HR as she had many concerns and did not properly understand the meaning of her BP readings Overall, her BP readings from home have been stable, some elevation in SBP ranging 130-150's and DBP 80-90's. Since discontinuation of tenormin her HR has been stable, 70-90's. We also had in-depth discussions about CPAP and OSA, she refuses to wear CPAP, she currently sleeps in her recliner for comfort and has been told by her husband that she no longer snores, she feels rested during the day, denies waking up with headaches and rarely falls asleep when sitting quietly She also discussed at her last visit that she was having chest pain, this has since resolved and she is no longer experiencing these symptoms Would like to have a colonoscopy  Current Medication: Outpatient Encounter Medications as of 01/09/2020  Medication Sig  . B-COMPLEX-C PO Take by mouth daily.  Marland Kitchen ELDERBERRY PO Take by mouth.  . ergocalciferol (DRISDOL) 1.25 MG (50000 UT) capsule Take 1 capsule (50,000 Units total) by mouth once a week.  . erythromycin ophthalmic ointment Apply to sutures 4 times a day for 10-12 days.  Discontinue if allergy develops and call our office  . lisinopril (ZESTRIL) 10 MG tablet Take 1 tablet (10 mg total) by mouth in the morning and at bedtime.   . traMADol (ULTRAM) 50 MG tablet Take 1 every 4-6 hours as needed for pain not controlled by Tylenol  . vitamin B-12 (CYANOCOBALAMIN) 1000 MCG tablet Take 1,000 mcg by mouth in the morning.  Marland Kitchen atenolol (TENORMIN) 25 MG tablet Take 1 tablet (25 mg total) by mouth every evening. (Patient not taking: Reported on 12/06/2019)  . hydrochlorothiazide (HYDRODIURIL) 12.5 MG tablet Take 1 tablet (12.5 mg total) by mouth daily.   No facility-administered encounter medications on file as of 01/09/2020.    Surgical History: Past Surgical History:  Procedure Laterality Date  . BROW LIFT Bilateral 12/13/2019   Procedure: BLEPHAROPLASTY UPPER EYELID; W/EXCESS SKIN BLEPHAROPTOSIS REPAIR; RESECT EX;  Surgeon: Karle Starch, MD;  Location: [facility];  Service: Ophthalmology;  Laterality: Bilateral;  . right ankle surgery  1997  . TUBAL LIGATION  1977    Medical History: Past Medical History:  Diagnosis Date  . Chronic cough   . Hypertension   . Mild cardiomegaly   . Sinus trouble   . Sleep apnea    no CPAP    Family History: Family History  Problem Relation Age of Onset  . Hypertension Mother   . Pulmonary fibrosis Father   . Breast cancer Sister     Social History   Socioeconomic History  . Marital status: Married    Spouse name: Not on file  . Number of children: Not on file  . Years of education: Not  on file  . Highest education level: Not on file  Occupational History  . Not on file  Tobacco Use  . Smoking status: Current Every Day Smoker    Packs/day: 1.00    Years: 50.00    Pack years: 50.00    Types: Cigarettes  . Smokeless tobacco: Never Used  Substance and Sexual Activity  . Alcohol use: Yes    Alcohol/week: 7.0 standard drinks    Types: 7 Glasses of wine per week    Comment: wine daily  . Drug use: Never  . Sexual activity: Not on file  Other Topics Concern  . Not on file  Social History Narrative  . Not on file   Social Determinants of Health    Financial Resource Strain:   . Difficulty of Paying Living Expenses: Not on file  Food Insecurity:   . Worried About Charity fundraiser in the Last Year: Not on file  . Ran Out of Food in the Last Year: Not on file  Transportation Needs:   . Lack of Transportation (Medical): Not on file  . Lack of Transportation (Non-Medical): Not on file  Physical Activity:   . Days of Exercise per Week: Not on file  . Minutes of Exercise per Session: Not on file  Stress:   . Feeling of Stress : Not on file  Social Connections:   . Frequency of Communication with Friends and Family: Not on file  . Frequency of Social Gatherings with Friends and Family: Not on file  . Attends Religious Services: Not on file  . Active Member of Clubs or Organizations: Not on file  . Attends Archivist Meetings: Not on file  . Marital Status: Not on file  Intimate Partner Violence:   . Fear of Current or Ex-Partner: Not on file  . Emotionally Abused: Not on file  . Physically Abused: Not on file  . Sexually Abused: Not on file   Review of Systems  Constitutional: Negative for chills, diaphoresis and fatigue.  HENT: Negative for ear pain, postnasal drip and sinus pressure.   Eyes: Negative for photophobia, discharge, redness, itching and visual disturbance.  Respiratory: Negative for cough, shortness of breath and wheezing.   Cardiovascular: Negative for chest pain, palpitations and leg swelling.  Gastrointestinal: Negative for abdominal pain, constipation, diarrhea, nausea and vomiting.  Genitourinary: Negative for dysuria and flank pain.  Musculoskeletal: Negative for arthralgias, back pain, gait problem and neck pain.  Skin: Negative for color change.  Allergic/Immunologic: Negative for environmental allergies and food allergies.  Neurological: Negative for dizziness and headaches.  Hematological: Does not bruise/bleed easily.  Psychiatric/Behavioral: Negative for agitation, behavioral problems  (depression) and hallucinations.    Vital Signs: BP (!) 140/92   Pulse 78   Temp 98.2 F (36.8 C)   Resp 16   Ht 5\' 1"  (1.549 m)   Wt 156 lb 4.8 oz (70.9 kg)   SpO2 98%   BMI 29.53 kg/m    Physical Exam Vitals reviewed.  Constitutional:      Appearance: Normal appearance.  HENT:     Nose: Nose normal.     Mouth/Throat:     Mouth: Mucous membranes are moist.     Pharynx: Oropharynx is clear.  Cardiovascular:     Rate and Rhythm: Normal rate and regular rhythm.     Pulses: Normal pulses.     Heart sounds: Normal heart sounds.  Pulmonary:     Effort: Pulmonary effort is normal.  Breath sounds: Normal breath sounds.  Abdominal:     General: Abdomen is flat.     Palpations: Abdomen is soft.  Musculoskeletal:        General: Normal range of motion.     Cervical back: Normal range of motion.  Skin:    General: Skin is warm.  Neurological:     General: No focal deficit present.     Mental Status: She is alert and oriented to person, place, and time. Mental status is at baseline.  Psychiatric:        Mood and Affect: Mood normal.        Behavior: Behavior normal.        Thought Content: Thought content normal.    Assessment/Plan: 1. Benign hypertension BP today continues to be elevated and home readings are also slightly elevated. Since beta blocker discontinuation her HR has stabilized. Continue with lisinopril, add HCTZ 12.5 mg once daily to be taken in the morning. Advised to continue with home monitoring and will continue routine monitoring and response to dual therapy at next visit. - hydrochlorothiazide (HYDRODIURIL) 12.5 MG tablet; Take 1 tablet (12.5 mg total) by mouth daily.  Dispense: 90 tablet; Refill: 0  2. OSA (obstructive sleep apnea) Declines CPAP therapy. Overnight pulse oximetry ordered at last visit, will need to investigate as to what happened as she has yet to have this testing done. Will need to assess for hypoxia and adjust plan of care  accordingly.  3. Screening for colon cancer Refer to GI for routine colonoscopy screening. - Ambulatory referral to Gastroenterology  4. Cigarette nicotine dependence without complication Continues to smoke daily, at next visit may need to order carotid US due to long standing history of cigarette smoking.  Labs ordered to be updated for this year. Will review with her at next follow-up visit. - CBC w/Diff/Platelet - Comprehensive Metabolic Panel (CMET) - Lipid Panel With LDL/HDL Ratio - TSH + free T4 - B12 - Vitamin D 1,25 dihydroxy - MM Digital Screening; Future  General Counseling: alvetta hidrogo understanding of the findings of todays visit and agrees with plan of treatment. I have discussed any further diagnostic evaluation that may be needed or ordered today. We also reviewed her medications today. she has been encouraged to call the office with any questions or concerns that should arise related to todays visit.    Orders Placed This Encounter  Procedures  . MM Digital Screening  . CBC w/Diff/Platelet  . Comprehensive Metabolic Panel (CMET)  . Lipid Panel With LDL/HDL Ratio  . TSH + free T4  . B12  . Vitamin D 1,25 dihydroxy  . Ambulatory referral to Gastroenterology    Meds ordered this encounter  Medications  . hydrochlorothiazide (HYDRODIURIL) 12.5 MG tablet    Sig: Take 1 tablet (12.5 mg total) by mouth daily.    Dispense:  90 tablet    Refill:  0    Time spent: 35 Minutes Time spent includes review of chart, medications, test results and follow-up plan with the patient.  This patient was seen by Theodoro Grist AGNP-C in Collaboration with Dr Lavera Guise as a part of collaborative care agreement     Tanna Furry. Aymee Fomby AGNP-C Internal medicine

## 2020-01-11 ENCOUNTER — Encounter: Payer: Self-pay | Admitting: Hospice and Palliative Medicine

## 2020-01-14 ENCOUNTER — Encounter: Payer: Self-pay | Admitting: *Deleted

## 2020-01-16 ENCOUNTER — Telehealth: Payer: Self-pay

## 2020-01-16 NOTE — Telephone Encounter (Signed)
Re printed orders for overnight ox test and sent to [facility]. Hailey Wong

## 2020-02-11 DIAGNOSIS — E782 Mixed hyperlipidemia: Secondary | ICD-10-CM | POA: Diagnosis not present

## 2020-02-11 DIAGNOSIS — E559 Vitamin D deficiency, unspecified: Secondary | ICD-10-CM | POA: Diagnosis not present

## 2020-02-11 DIAGNOSIS — E038 Other specified hypothyroidism: Secondary | ICD-10-CM | POA: Diagnosis not present

## 2020-02-11 DIAGNOSIS — Z0001 Encounter for general adult medical examination with abnormal findings: Secondary | ICD-10-CM | POA: Diagnosis not present

## 2020-02-11 DIAGNOSIS — D510 Vitamin B12 deficiency anemia due to intrinsic factor deficiency: Secondary | ICD-10-CM | POA: Diagnosis not present

## 2020-02-16 LAB — COMPREHENSIVE METABOLIC PANEL
ALT: 10 IU/L (ref 0–32)
AST: 14 IU/L (ref 0–40)
Albumin/Globulin Ratio: 2 (ref 1.2–2.2)
Albumin: 4.3 g/dL (ref 3.7–4.7)
Alkaline Phosphatase: 77 IU/L (ref 44–121)
BUN/Creatinine Ratio: 6 — ABNORMAL LOW (ref 12–28)
BUN: 4 mg/dL — ABNORMAL LOW (ref 8–27)
Bilirubin Total: 0.4 mg/dL (ref 0.0–1.2)
CO2: 27 mmol/L (ref 20–29)
Calcium: 9.4 mg/dL (ref 8.7–10.3)
Chloride: 90 mmol/L — ABNORMAL LOW (ref 96–106)
Creatinine, Ser: 0.63 mg/dL (ref 0.57–1.00)
GFR calc Af Amer: 102 mL/min/{1.73_m2} (ref >59)
GFR calc non Af Amer: 89 mL/min/{1.73_m2} (ref >59)
Globulin, Total: 2.2 g/dL (ref 1.5–4.5)
Glucose: 118 mg/dL — ABNORMAL HIGH (ref 65–99)
Potassium: 4.4 mmol/L (ref 3.5–5.2)
Sodium: 130 mmol/L — ABNORMAL LOW (ref 134–144)
Total Protein: 6.5 g/dL (ref 6.0–8.5)

## 2020-02-16 LAB — CBC WITH DIFFERENTIAL/PLATELET
Basophils Absolute: 0.1 10*3/uL (ref 0.0–0.2)
Basos: 1 %
EOS (ABSOLUTE): 0.3 10*3/uL (ref 0.0–0.4)
Eos: 4 %
Hematocrit: 42.3 % (ref 34.0–46.6)
Hemoglobin: 14.4 g/dL (ref 11.1–15.9)
Immature Grans (Abs): 0 10*3/uL (ref 0.0–0.1)
Immature Granulocytes: 0 %
Lymphocytes Absolute: 2.9 10*3/uL (ref 0.7–3.1)
Lymphs: 34 %
MCH: 31.4 pg (ref 26.6–33.0)
MCHC: 34 g/dL (ref 31.5–35.7)
MCV: 92 fL (ref 79–97)
Monocytes Absolute: 0.9 10*3/uL (ref 0.1–0.9)
Monocytes: 10 %
Neutrophils Absolute: 4.6 10*3/uL (ref 1.4–7.0)
Neutrophils: 51 %
Platelets: 301 10*3/uL (ref 150–450)
RBC: 4.58 x10E6/uL (ref 3.77–5.28)
RDW: 12.6 % (ref 11.7–15.4)
WBC: 8.8 10*3/uL (ref 3.4–10.8)

## 2020-02-16 LAB — LIPID PANEL WITH LDL/HDL RATIO
Cholesterol, Total: 225 mg/dL — ABNORMAL HIGH (ref 100–199)
HDL: 108 mg/dL (ref >39)
LDL Chol Calc (NIH): 105 mg/dL — ABNORMAL HIGH (ref 0–99)
LDL/HDL Ratio: 1 ratio (ref 0.0–3.2)
Triglycerides: 67 mg/dL (ref 0–149)
VLDL Cholesterol Cal: 12 mg/dL (ref 5–40)

## 2020-02-16 LAB — VITAMIN D 1,25 DIHYDROXY
Vitamin D 1, 25 (OH)2 Total: 87 pg/mL — ABNORMAL HIGH
Vitamin D2 1, 25 (OH)2: 17 pg/mL
Vitamin D3 1, 25 (OH)2: 70 pg/mL

## 2020-02-16 LAB — TSH+FREE T4
Free T4: 1.41 ng/dL (ref 0.82–1.77)
TSH: 1.27 u[IU]/mL (ref 0.450–4.500)

## 2020-02-16 LAB — VITAMIN B12: Vitamin B-12: 723 pg/mL (ref 232–1245)

## 2020-02-19 ENCOUNTER — Ambulatory Visit (INDEPENDENT_AMBULATORY_CARE_PROVIDER_SITE_OTHER): Payer: Medicare PPO | Admitting: Hospice and Palliative Medicine

## 2020-02-19 ENCOUNTER — Encounter: Payer: Self-pay | Admitting: Hospice and Palliative Medicine

## 2020-02-19 ENCOUNTER — Other Ambulatory Visit: Payer: Self-pay

## 2020-02-19 DIAGNOSIS — E782 Mixed hyperlipidemia: Secondary | ICD-10-CM

## 2020-02-19 DIAGNOSIS — E871 Hypo-osmolality and hyponatremia: Secondary | ICD-10-CM

## 2020-02-19 DIAGNOSIS — R002 Palpitations: Secondary | ICD-10-CM

## 2020-02-19 DIAGNOSIS — I10 Essential (primary) hypertension: Secondary | ICD-10-CM

## 2020-02-19 NOTE — Progress Notes (Signed)
[facility] [place], [place] [address]  Internal MEDICINE  Office Visit Note  Patient Name: Hailey Wong  [id number]  [id number]  Date of Service: 02/20/2020  Chief Complaint  Patient presents with  . Follow-up  . Hypertension  . Sleep Apnea    HPI Patient is here for routine follow-up Continues to routinely monitor her BP and HR at home--few readings of low BP, reports not having any symptoms Her home monitor has read irregular heart beat on a few occasions--she says when this has happened she does notice heart palpitations, BP at that time is stable, palpitation lasts a few minutes denies chest pain, dizziness or headaches  Reviewed her blood work-sodium low at 130, kidney function decreased slightly, possible mild dehydration, elevated cholesterol and LDL  Current Medication: Outpatient Encounter Medications as of 02/19/2020  Medication Sig  . B-COMPLEX-C PO Take by mouth daily.  Marland Kitchen ELDERBERRY PO Take by mouth.  . ergocalciferol (DRISDOL) 1.25 MG (50000 UT) capsule Take 1 capsule (50,000 Units total) by mouth once a week.  . hydrochlorothiazide (HYDRODIURIL) 12.5 MG tablet Take 1 tablet (12.5 mg total) by mouth daily.  Marland Kitchen lisinopril (ZESTRIL) 10 MG tablet Take 1 tablet (10 mg total) by mouth in the morning and at bedtime.  . vitamin B-12 (CYANOCOBALAMIN) 1000 MCG tablet Take 1,000 mcg by mouth in the morning.  . [DISCONTINUED] atenolol (TENORMIN) 25 MG tablet Take 1 tablet (25 mg total) by mouth every evening. (Patient not taking: Reported on 02/19/2020)  . [DISCONTINUED] erythromycin ophthalmic ointment Apply to sutures 4 times a day for 10-12 days.  Discontinue if allergy develops and call our office (Patient not taking: Reported on 02/19/2020)  . [DISCONTINUED] traMADol (ULTRAM) 50 MG tablet Take 1 every 4-6 hours as needed for pain not controlled by Tylenol (Patient not taking: Reported on 02/19/2020)   No facility-administered encounter  medications on file as of 02/19/2020.    Surgical History: Past Surgical History:  Procedure Laterality Date  . BROW LIFT Bilateral 12/13/2019   Procedure: BLEPHAROPLASTY UPPER EYELID; W/EXCESS SKIN BLEPHAROPTOSIS REPAIR; RESECT EX;  Surgeon: Karle Starch, MD;  Location: [place];  Service: Ophthalmology;  Laterality: Bilateral;  . right ankle surgery  1997  . TUBAL LIGATION  1977    Medical History: Past Medical History:  Diagnosis Date  . Chronic cough   . Hypertension   . Mild cardiomegaly   . Sinus trouble   . Sleep apnea    no CPAP    Family History: Family History  Problem Relation Age of Onset  . Hypertension Mother   . Pulmonary fibrosis Father   . Breast cancer Sister     Social History   Socioeconomic History  . Marital status: Married    Spouse name: Not on file  . Number of children: Not on file  . Years of education: Not on file  . Highest education level: Not on file  Occupational History  . Not on file  Tobacco Use  . Smoking status: Current Every Day Smoker    Packs/day: 1.00    Years: 50.00    Pack years: 50.00    Types: Cigarettes  . Smokeless tobacco: Never Used  Substance and Sexual Activity  . Alcohol use: Yes    Alcohol/week: 7.0 standard drinks    Types: 7 Glasses of wine per week    Comment: wine daily  . Drug use: Never  . Sexual activity: Not on file  Other Topics Concern  .  Not on file  Social History Narrative  . Not on file   Social Determinants of Health   Financial Resource Strain:   . Difficulty of Paying Living Expenses: Not on file  Food Insecurity:   . Worried About Charity fundraiser in the Last Year: Not on file  . Ran Out of Food in the Last Year: Not on file  Transportation Needs:   . Lack of Transportation (Medical): Not on file  . Lack of Transportation (Non-Medical): Not on file  Physical Activity:   . Days of Exercise per Week: Not on file  . Minutes of Exercise per Session: Not on file   Stress:   . Feeling of Stress : Not on file  Social Connections:   . Frequency of Communication with Friends and Family: Not on file  . Frequency of Social Gatherings with Friends and Family: Not on file  . Attends Religious Services: Not on file  . Active Member of Clubs or Organizations: Not on file  . Attends Archivist Meetings: Not on file  . Marital Status: Not on file  Intimate Partner Violence:   . Fear of Current or Ex-Partner: Not on file  . Emotionally Abused: Not on file  . Physically Abused: Not on file  . Sexually Abused: Not on file    Review of Systems  Constitutional: Negative for chills, diaphoresis and fatigue.  HENT: Negative for ear pain, postnasal drip and sinus pressure.   Eyes: Negative for photophobia, discharge, redness, itching and visual disturbance.  Respiratory: Negative for cough, shortness of breath and wheezing.   Cardiovascular: Positive for palpitations. Negative for chest pain and leg swelling.  Gastrointestinal: Negative for abdominal pain, constipation, diarrhea, nausea and vomiting.  Genitourinary: Negative for dysuria and flank pain.  Musculoskeletal: Negative for arthralgias, back pain, gait problem and neck pain.  Skin: Negative for color change.  Allergic/Immunologic: Negative for environmental allergies and food allergies.  Neurological: Negative for dizziness and headaches.  Hematological: Does not bruise/bleed easily.  Psychiatric/Behavioral: Negative for agitation, behavioral problems (depression) and hallucinations.    Vital Signs: BP 140/82   Pulse 61   Temp 97.8 F (36.6 C)   Resp 16   Ht 5\' 1"  (1.549 m)   Wt 157 lb (71.2 kg) Comment: with shoe  SpO2 97%   BMI 29.66 kg/m    Physical Exam Vitals reviewed.  Constitutional:      Appearance: Normal appearance.  Cardiovascular:     Rate and Rhythm: Normal rate and regular rhythm.     Pulses: Normal pulses.     Heart sounds: Normal heart sounds.  Pulmonary:      Effort: Pulmonary effort is normal.     Breath sounds: Normal breath sounds.  Abdominal:     General: Abdomen is flat.     Palpations: Abdomen is soft.  Musculoskeletal:        General: Normal range of motion.     Cervical back: Normal range of motion.  Skin:    General: Skin is warm.  Neurological:     General: No focal deficit present.     Mental Status: She is alert and oriented to person, place, and time. Mental status is at baseline.  Psychiatric:        Mood and Affect: Mood normal.        Behavior: Behavior normal.        Thought Content: Thought content normal.    Assessment/Plan: 1. Hyponatremia Na 130, encouraged to increase  her salt intake and dilute electrolyte drink to elevate sodium levels Will repeat CBC in 2 weeks to monitor for improvement Also monitor potassium level for cause of heart palpitations - CBC w/Diff/Platelet  2. Heart palpitations Will have her monitor frequency of episodes in the next month and make note--may need to consider holter monitor, echo and cardiology referral if palpitations persist  3. Benign hypertension BP and HR well controlled today on current therapy, will continue with routine monitoring  4. Mixed hyperlipidemia Discussed the risks associated with elevated lipids such as CVA and MI Discussed statin therapy in managing hyperlipidemia--she is not interested in medication therapy at this time, discussed we will recheck levels In 6 months and will need to strongly consider medication management if levels remain elevated at that time  General Counseling: Catriona verbalizes understanding of the findings of todays visit and agrees with plan of treatment. I have discussed any further diagnostic evaluation that may be needed or ordered today. We also reviewed her medications today. she has been encouraged to call the office with any questions or concerns that should arise related to todays visit.    Orders Placed This Encounter   Procedures  . CBC w/Diff/Platelet      Time spent: 30 Minutes Time spent includes review of chart, medications, test results and follow-up plan with the patient.  This patient was seen by Theodoro Grist AGNP-C in Collaboration with Dr Lavera Guise as a part of collaborative care agreement     Tanna Furry. Shamere Dilworth AGNP-C Internal medicine

## 2020-02-20 ENCOUNTER — Encounter: Payer: Self-pay | Admitting: Hospice and Palliative Medicine

## 2020-03-10 ENCOUNTER — Other Ambulatory Visit: Payer: Self-pay | Admitting: Hospice and Palliative Medicine

## 2020-03-10 DIAGNOSIS — E871 Hypo-osmolality and hyponatremia: Secondary | ICD-10-CM

## 2020-03-11 ENCOUNTER — Other Ambulatory Visit: Payer: Self-pay | Admitting: Hospice and Palliative Medicine

## 2020-03-11 ENCOUNTER — Other Ambulatory Visit: Payer: Self-pay | Admitting: Internal Medicine

## 2020-03-11 DIAGNOSIS — I10 Essential (primary) hypertension: Secondary | ICD-10-CM

## 2020-03-11 LAB — COMPREHENSIVE METABOLIC PANEL
ALT: 11 IU/L (ref 0–32)
AST: 14 IU/L (ref 0–40)
Albumin/Globulin Ratio: 2.2 (ref 1.2–2.2)
Albumin: 4.4 g/dL (ref 3.7–4.7)
Alkaline Phosphatase: 76 IU/L (ref 44–121)
BUN/Creatinine Ratio: 16 (ref 12–28)
BUN: 10 mg/dL (ref 8–27)
Bilirubin Total: 0.4 mg/dL (ref 0.0–1.2)
CO2: 26 mmol/L (ref 20–29)
Calcium: 9.4 mg/dL (ref 8.7–10.3)
Chloride: 90 mmol/L — ABNORMAL LOW (ref 96–106)
Creatinine, Ser: 0.64 mg/dL (ref 0.57–1.00)
GFR calc Af Amer: 102 mL/min/{1.73_m2} (ref >59)
GFR calc non Af Amer: 88 mL/min/{1.73_m2} (ref >59)
Globulin, Total: 2 g/dL (ref 1.5–4.5)
Glucose: 110 mg/dL — ABNORMAL HIGH (ref 65–99)
Potassium: 4.1 mmol/L (ref 3.5–5.2)
Sodium: 130 mmol/L — ABNORMAL LOW (ref 134–144)
Total Protein: 6.4 g/dL (ref 6.0–8.5)

## 2020-03-11 NOTE — Progress Notes (Signed)
Labs reviewed, trying to have appt moved up to next week for discussion and further evaluation.

## 2020-03-16 ENCOUNTER — Ambulatory Visit: Payer: Medicare PPO | Admitting: Hospice and Palliative Medicine

## 2020-03-16 ENCOUNTER — Other Ambulatory Visit: Payer: Self-pay

## 2020-03-16 ENCOUNTER — Encounter: Payer: Self-pay | Admitting: Hospice and Palliative Medicine

## 2020-03-16 DIAGNOSIS — R002 Palpitations: Secondary | ICD-10-CM

## 2020-03-16 DIAGNOSIS — F17210 Nicotine dependence, cigarettes, uncomplicated: Secondary | ICD-10-CM

## 2020-03-16 DIAGNOSIS — E871 Hypo-osmolality and hyponatremia: Secondary | ICD-10-CM | POA: Diagnosis not present

## 2020-03-16 DIAGNOSIS — E782 Mixed hyperlipidemia: Secondary | ICD-10-CM | POA: Diagnosis not present

## 2020-03-16 DIAGNOSIS — R0989 Other specified symptoms and signs involving the circulatory and respiratory systems: Secondary | ICD-10-CM | POA: Diagnosis not present

## 2020-03-16 NOTE — Progress Notes (Signed)
[facility] [place], [place] [address]  Internal MEDICINE  Office Visit Note  Patient Name: Hailey Wong  [id number]  [id number]  Date of Service: 03/21/2020  Chief Complaint  Patient presents with  . Follow-up    review labs  . Hypertension  . Sleep Apnea  . policy update form  . Quality Metric Gaps    colonoscopy, mammogram    HPI Patient is here for routine follow-up -Hyponatremia-Has been drinking diluted Gatorade few times per day and has increased her salt intake since last visit--repeat sodium level remains at 130//kidney function normal She does continue to smoke about 3-4 cigarettes per day  -Has not experienced any further palpitations since last visit -Further discussions about abnormal lipid panel--still not wanting to start statin therapy, stressed the risk of heart attack and stroke -No further episodes of hypotension on home readings  Current Medication: Outpatient Encounter Medications as of 03/16/2020  Medication Sig  . B-COMPLEX-C PO Take by mouth daily.  Marland Kitchen ELDERBERRY PO Take by mouth.  . ergocalciferol (DRISDOL) 1.25 MG (50000 UT) capsule Take 1 capsule (50,000 Units total) by mouth once a week.  . hydrochlorothiazide (HYDRODIURIL) 12.5 MG tablet TAKE 1 TABLET BY MOUTH ONCE DAILY  . lisinopril (ZESTRIL) 10 MG tablet TAKE 1 TABLET BY MOUTH TWICE DAILY  . vitamin B-12 (CYANOCOBALAMIN) 1000 MCG tablet Take 1,000 mcg by mouth in the morning.   No facility-administered encounter medications on file as of 03/16/2020.    Surgical History: Past Surgical History:  Procedure Laterality Date  . BROW LIFT Bilateral 12/13/2019   Procedure: BLEPHAROPLASTY UPPER EYELID; W/EXCESS SKIN BLEPHAROPTOSIS REPAIR; RESECT EX;  Surgeon: Karle Starch, MD;  Location: [place];  Service: Ophthalmology;  Laterality: Bilateral;  . right ankle surgery  1997  . TUBAL LIGATION  1977    Medical History: Past Medical History:  Diagnosis Date   . Chronic cough   . Hypertension   . Mild cardiomegaly   . Sinus trouble   . Sleep apnea    no CPAP    Family History: Family History  Problem Relation Age of Onset  . Hypertension Mother   . Pulmonary fibrosis Father   . Breast cancer Sister     Social History   Socioeconomic History  . Marital status: Married    Spouse name: Not on file  . Number of children: Not on file  . Years of education: Not on file  . Highest education level: Not on file  Occupational History  . Not on file  Tobacco Use  . Smoking status: Current Every Day Smoker    Packs/day: 1.00    Years: 50.00    Pack years: 50.00    Types: Cigarettes  . Smokeless tobacco: Never Used  Substance and Sexual Activity  . Alcohol use: Yes    Alcohol/week: 7.0 standard drinks    Types: 7 Glasses of wine per week    Comment: wine daily  . Drug use: Never  . Sexual activity: Not on file  Other Topics Concern  . Not on file  Social History Narrative  . Not on file   Social Determinants of Health   Financial Resource Strain:   . Difficulty of Paying Living Expenses: Not on file  Food Insecurity:   . Worried About Charity fundraiser in the Last Year: Not on file  . Ran Out of Food in the Last Year: Not on file  Transportation Needs:   . Lack  of Transportation (Medical): Not on file  . Lack of Transportation (Non-Medical): Not on file  Physical Activity:   . Days of Exercise per Week: Not on file  . Minutes of Exercise per Session: Not on file  Stress:   . Feeling of Stress : Not on file  Social Connections:   . Frequency of Communication with Friends and Family: Not on file  . Frequency of Social Gatherings with Friends and Family: Not on file  . Attends Religious Services: Not on file  . Active Member of Clubs or Organizations: Not on file  . Attends Archivist Meetings: Not on file  . Marital Status: Not on file  Intimate Partner Violence:   . Fear of Current or Ex-Partner: Not on  file  . Emotionally Abused: Not on file  . Physically Abused: Not on file  . Sexually Abused: Not on file      Review of Systems  Constitutional: Negative for chills, diaphoresis and fatigue.  HENT: Negative for ear pain, postnasal drip and sinus pressure.   Eyes: Negative for photophobia, discharge, redness, itching and visual disturbance.  Respiratory: Negative for cough, shortness of breath and wheezing.   Cardiovascular: Negative for chest pain, palpitations and leg swelling.  Gastrointestinal: Negative for abdominal pain, constipation, diarrhea, nausea and vomiting.  Genitourinary: Negative for dysuria and flank pain.  Musculoskeletal: Negative for arthralgias, back pain, gait problem and neck pain.  Skin: Negative for color change.  Allergic/Immunologic: Negative for environmental allergies and food allergies.  Neurological: Negative for dizziness and headaches.  Hematological: Does not bruise/bleed easily.  Psychiatric/Behavioral: Negative for agitation, behavioral problems (depression) and hallucinations.    Vital Signs: BP 136/72   Pulse 67   Temp (!) 97.5 F (36.4 C)   Resp 16   Ht 5\' 1"  (1.549 m)   Wt 157 lb 3.2 oz (71.3 kg)   SpO2 97%   BMI 29.70 kg/m    Physical Exam Vitals reviewed.  Constitutional:      Appearance: Normal appearance. She is normal weight.  Neck:     Vascular: Carotid bruit present.  Cardiovascular:     Rate and Rhythm: Normal rate and regular rhythm.     Pulses: Normal pulses.     Heart sounds: Normal heart sounds.  Pulmonary:     Effort: Pulmonary effort is normal.     Breath sounds: Normal breath sounds.  Musculoskeletal:        General: Normal range of motion.     Cervical back: Normal range of motion.  Skin:    General: Skin is warm.  Neurological:     General: No focal deficit present.     Mental Status: She is alert and oriented to person, place, and time. Mental status is at baseline.  Psychiatric:        Mood and  Affect: Mood normal.        Behavior: Behavior normal.        Thought Content: Thought content normal.    Assessment/Plan: 1. Hyponatremia Continue with electrolyte replacement drinks and increased sodium intake Will recheck levels prior to next visit Low dose CT scan due to hyponatremia and long standing cigarette smoking - CT CHEST LUNG CA SCREEN LOW DOSE W/O CM; Future - Comprehensive Metabolic Panel (CMET)  2. Bilateral carotid bruits Will obtain and review carotid US - VAS US CAROTID; Future  3. Mixed hyperlipidemia Discussed risks associated with untreated hyperlipidemia Will review carotid US--will need to strongly consider statin and ASA  therapy based on results - VAS US CAROTID; Future  4. Cigarette nicotine dependence without complication Dicussed importance of smoking cessation - CT CHEST LUNG CA SCREEN LOW DOSE W/O CM; Future  5. Heart palpitations Resolved at this time, no further episodes Will need further cardiac work-up if palpitations reoccur  General Counseling: Guliana verbalizes understanding of the findings of todays visit and agrees with plan of treatment. I have discussed any further diagnostic evaluation that may be needed or ordered today. We also reviewed her medications today. she has been encouraged to call the office with any questions or concerns that should arise related to todays visit.  Smoking cessation counseling: 1. Pt acknowledges the risks of long term smoking, she will try to quite smoking. 2. Options for different medications including nicotine products, chewing gum, patch etc, Wellbutrin and Chantix is discussed 3. Goal and date of compete cessation is discussed 4. Total time spent in smoking cessation is 15 min.  Orders Placed This Encounter  Procedures  . CT CHEST LUNG CA SCREEN LOW DOSE W/O CM  . Comprehensive Metabolic Panel (CMET)  . VAS US CAROTID      Time spent: 30 Minutes   This patient was seen by Theodoro Grist AGNP-C  in Collaboration with Dr Lavera Guise as a part of collaborative care agreement     Tanna Furry. Larrisa Cravey AGNP-C Internal medicine

## 2020-03-21 ENCOUNTER — Encounter: Payer: Self-pay | Admitting: Hospice and Palliative Medicine

## 2020-03-24 ENCOUNTER — Other Ambulatory Visit: Payer: Self-pay

## 2020-03-24 DIAGNOSIS — F17210 Nicotine dependence, cigarettes, uncomplicated: Secondary | ICD-10-CM

## 2020-03-24 NOTE — Progress Notes (Signed)
Changed ct order to chest ct without

## 2020-03-25 ENCOUNTER — Ambulatory Visit: Payer: Medicare PPO | Admitting: Hospice and Palliative Medicine

## 2020-04-03 ENCOUNTER — Other Ambulatory Visit: Payer: Self-pay

## 2020-04-03 ENCOUNTER — Ambulatory Visit
Admission: RE | Admit: 2020-04-03 | Discharge: 2020-04-03 | Disposition: A | Discharge status: Home / Self Care | Payer: Medicare PPO | Source: Ambulatory Visit | Attending: Hospice and Palliative Medicine | Admitting: Hospice and Palliative Medicine

## 2020-04-03 DIAGNOSIS — F17210 Nicotine dependence, cigarettes, uncomplicated: Secondary | ICD-10-CM | POA: Insufficient documentation

## 2020-04-03 DIAGNOSIS — E041 Nontoxic single thyroid nodule: Secondary | ICD-10-CM | POA: Insufficient documentation

## 2020-04-03 DIAGNOSIS — R0602 Shortness of breath: Secondary | ICD-10-CM | POA: Diagnosis not present

## 2020-04-03 DIAGNOSIS — J439 Emphysema, unspecified: Secondary | ICD-10-CM | POA: Insufficient documentation

## 2020-04-03 DIAGNOSIS — I700 Atherosclerosis of aorta: Secondary | ICD-10-CM | POA: Insufficient documentation

## 2020-04-06 ENCOUNTER — Other Ambulatory Visit: Payer: Self-pay | Admitting: Hospice and Palliative Medicine

## 2020-04-06 DIAGNOSIS — E041 Nontoxic single thyroid nodule: Secondary | ICD-10-CM

## 2020-04-06 NOTE — Progress Notes (Signed)
Called and discussed results with patient, order for thyroid US placed.

## 2020-04-10 ENCOUNTER — Ambulatory Visit: Payer: Medicare PPO

## 2020-04-10 DIAGNOSIS — R0989 Other specified symptoms and signs involving the circulatory and respiratory systems: Secondary | ICD-10-CM | POA: Diagnosis not present

## 2020-04-10 DIAGNOSIS — E782 Mixed hyperlipidemia: Secondary | ICD-10-CM

## 2020-05-01 ENCOUNTER — Other Ambulatory Visit: Payer: Self-pay

## 2020-05-01 ENCOUNTER — Ambulatory Visit (INDEPENDENT_AMBULATORY_CARE_PROVIDER_SITE_OTHER): Payer: Medicare PPO

## 2020-05-01 DIAGNOSIS — E041 Nontoxic single thyroid nodule: Secondary | ICD-10-CM

## 2020-05-04 NOTE — Progress Notes (Signed)
Please review thyroid US. Thanks!

## 2020-05-12 ENCOUNTER — Other Ambulatory Visit: Payer: Self-pay | Admitting: Hospice and Palliative Medicine

## 2020-05-14 ENCOUNTER — Ambulatory Visit (INDEPENDENT_AMBULATORY_CARE_PROVIDER_SITE_OTHER): Payer: Medicare PPO | Admitting: Hospice and Palliative Medicine

## 2020-05-14 ENCOUNTER — Encounter: Payer: Self-pay | Admitting: Hospice and Palliative Medicine

## 2020-05-14 ENCOUNTER — Other Ambulatory Visit: Payer: Self-pay

## 2020-05-14 VITALS — BP 144/86 | HR 66 | Temp 97.6°F | Resp 16 | Ht 61.0 in | Wt 156.0 lb

## 2020-05-14 DIAGNOSIS — I10 Essential (primary) hypertension: Secondary | ICD-10-CM | POA: Diagnosis not present

## 2020-05-14 DIAGNOSIS — I700 Atherosclerosis of aorta: Secondary | ICD-10-CM | POA: Diagnosis not present

## 2020-05-14 DIAGNOSIS — E042 Nontoxic multinodular goiter: Secondary | ICD-10-CM | POA: Diagnosis not present

## 2020-05-14 DIAGNOSIS — I6523 Occlusion and stenosis of bilateral carotid arteries: Secondary | ICD-10-CM | POA: Diagnosis not present

## 2020-05-14 MED ORDER — ROSUVASTATIN CALCIUM 5 MG PO TABS: 5.0000 mg | ORAL_TABLET | Freq: Every day | ORAL | 3 refills | Status: DC

## 2020-05-14 NOTE — Progress Notes (Signed)
[facility] [place], [place] [address]  Internal MEDICINE  Office Visit Note  Patient Name: Hailey Wong  [id number]  [id number]  Date of Service: 05/14/2020  Chief Complaint  Patient presents with  . Follow-up    [place] results  . Hypertension    HPI Patient is here for routine follow-up Reviewed results from testing she has recently had CT scan due to long standing smoking history and hyponatremia IMPRESSION: 1. No CT evidence for acute intrathoracic abnormality. 2. 1.8 cm hypodense nodule in the right lobe of thyroid. Based on age of patient and size of lesion, thyroid ultrasound recommended for further evaluation. 3. Emphysema and aortic atherosclerosis.  Findings prompted carotid as well as thyroid US Carotid US--unofficial report Left and right carotid mild plaque build-up  Thyroid US Right lobe 3 nodules: 1. Well-circumscribed, somewhat lobulated hypoechoic nodule upper portion right lobe measuring 1.2X1.6X1.6 cm 2. Well-circumscribed hypoechoic nodule midportion right lobe measuring 0.4X0.4X0.8 cm 3. Small hypoechoic nodule lower portion right lobe measuring 0.4X0.4X1.0 cm Left lobe 2 nodules: 1. Well-circumscribed hypoechoic nodule midportion left lobe measuring 0.7X0.7X1.3 cm  Discussed elevated BP today--she has been worried about receiving her results--reviewed home readings averaging 130/70's   Current Medication: Outpatient Encounter Medications as of 05/14/2020  Medication Sig  . rosuvastatin (CRESTOR) 5 MG tablet Take 1 tablet (5 mg total) by mouth daily.  . B-COMPLEX-C PO Take by mouth daily.  Marland Kitchen ELDERBERRY PO Take by mouth.  . ergocalciferol (DRISDOL) 1.25 MG (50000 UT) capsule Take 1 capsule (50,000 Units total) by mouth once a week.  . hydrochlorothiazide (HYDRODIURIL) 12.5 MG tablet TAKE 1 TABLET BY MOUTH ONCE DAILY  . lisinopril (ZESTRIL) 10 MG tablet TAKE 1 TABLET BY MOUTH TWICE DAILY  . vitamin B-12 (CYANOCOBALAMIN) 1000  MCG tablet Take 1,000 mcg by mouth in the morning.   No facility-administered encounter medications on file as of 05/14/2020.    Surgical History: Past Surgical History:  Procedure Laterality Date  . BROW LIFT Bilateral 12/13/2019   Procedure: BLEPHAROPLASTY UPPER EYELID; W/EXCESS SKIN BLEPHAROPTOSIS REPAIR; RESECT EX;  Surgeon: Karle Starch, MD;  Location: [facility];  Service: Ophthalmology;  Laterality: Bilateral;  . right ankle surgery  1997  . TUBAL LIGATION  1977    Medical History: Past Medical History:  Diagnosis Date  . Chronic cough   . Hypertension   . Mild cardiomegaly   . Sinus trouble   . Sleep apnea    no CPAP    Family History: Family History  Problem Relation Age of Onset  . Hypertension Mother   . Pulmonary fibrosis Father   . Breast cancer Sister     Social History   Socioeconomic History  . Marital status: Married    Spouse name: Not on file  . Number of children: Not on file  . Years of education: Not on file  . Highest education level: Not on file  Occupational History  . Not on file  Tobacco Use  . Smoking status: Current Every Day Smoker    Packs/day: 1.00    Years: 50.00    Pack years: 50.00    Types: Cigarettes  . Smokeless tobacco: Never Used  Substance and Sexual Activity  . Alcohol use: Yes    Alcohol/week: 7.0 standard drinks    Types: 7 Glasses of wine per week    Comment: wine daily  . Drug use: Never  . Sexual activity: Not on file  Other Topics Concern  .  Not on file  Social History Narrative  . Not on file   Social Determinants of Health   Financial Resource Strain: Not on file  Food Insecurity: Not on file  Transportation Needs: Not on file  Physical Activity: Not on file  Stress: Not on file  Social Connections: Not on file  Intimate Partner Violence: Not on file      Review of Systems  Constitutional: Negative for chills, diaphoresis and fatigue.  HENT: Negative for ear pain, postnasal drip and  sinus pressure.   Eyes: Negative for photophobia, discharge, redness, itching and visual disturbance.  Respiratory: Negative for cough, shortness of breath and wheezing.   Cardiovascular: Negative for chest pain, palpitations and leg swelling.  Gastrointestinal: Negative for abdominal pain, constipation, diarrhea, nausea and vomiting.  Genitourinary: Negative for dysuria and flank pain.  Musculoskeletal: Negative for arthralgias, back pain, gait problem and neck pain.  Skin: Negative for color change.  Allergic/Immunologic: Negative for environmental allergies and food allergies.  Neurological: Negative for dizziness and headaches.  Hematological: Does not bruise/bleed easily.  Psychiatric/Behavioral: Negative for agitation, behavioral problems (depression) and hallucinations.    Vital Signs: BP (!) 144/86   Pulse 66   Temp 97.6 F (36.4 C)   Resp 16   Ht 5\' 1"  (1.549 m)   Wt 156 lb (70.8 kg)   SpO2 99%   BMI 29.48 kg/m    Physical Exam Vitals reviewed.  Constitutional:      Appearance: Normal appearance. She is normal weight.  Cardiovascular:     Rate and Rhythm: Normal rate and regular rhythm.     Pulses: Normal pulses.     Heart sounds: Normal heart sounds.  Pulmonary:     Effort: Pulmonary effort is normal.     Breath sounds: Normal breath sounds.  Abdominal:     General: Abdomen is flat.     Palpations: Abdomen is soft.  Musculoskeletal:        General: Normal range of motion.     Cervical back: Normal range of motion.  Skin:    General: Skin is warm.  Neurological:     General: No focal deficit present.     Mental Status: She is alert and oriented to person, place, and time. Mental status is at baseline.  Psychiatric:        Mood and Affect: Mood normal.        Behavior: Behavior normal.        Thought Content: Thought content normal.        Judgment: Judgment normal.    Assessment/Plan: 1. Aortic atherosclerosis ([place]) Start low dose rosuvastatin  therapy--also encouraged to start low dose ASA daily Continue to monitor Discussed importance of preventing further sclerosis - rosuvastatin (CRESTOR) 5 MG tablet; Take 1 tablet (5 mg total) by mouth daily.  Dispense: 90 tablet; Refill: 3  2. Multiple thyroid nodules Referral to ENT for FNA--will follow-up on pathology - Ambulatory referral to ENT  3. Essential hypertension BP slightly elevated today--likely due to anxiety over discussing results Home readings have been stable--closely monitor and adjust therapy if continues to be elevated  4. Bilateral carotid artery stenosis Statin therapy and low dose ASA Repeat in 1 year - rosuvastatin (CRESTOR) 5 MG tablet; Take 1 tablet (5 mg total) by mouth daily.  Dispense: 90 tablet; Refill: 3  General Counseling: Naida verbalizes understanding of the findings of todays visit and agrees with plan of treatment. I have discussed any further diagnostic evaluation that may be  needed or ordered today. We also reviewed her medications today. she has been encouraged to call the office with any questions or concerns that should arise related to todays visit.    Orders Placed This Encounter  Procedures  . Ambulatory referral to ENT    Meds ordered this encounter  Medications  . rosuvastatin (CRESTOR) 5 MG tablet    Sig: Take 1 tablet (5 mg total) by mouth daily.    Dispense:  90 tablet    Refill:  3    Time spent: 30 Minutes Time spent includes review of chart, medications, test results and follow-up plan with the patient.  This patient was seen by Theodoro Grist AGNP-C in Collaboration with Dr Lavera Guise as a part of collaborative care agreement     Tanna Furry. Victorian Gunn AGNP-C Internal medicine

## 2020-05-14 NOTE — Patient Instructions (Signed)
a 

## 2020-05-24 NOTE — Procedures (Signed)
[place], [place] [address]  DATE OF SERVICE: April 10, 2020  CAROTID DOPPLER INTERPRETATION:  Bilateral Carotid Ultrsasound and Color Doppler Examination was performed. The RIGHT CCA shows minimal plaque in the vessel. The LEFT CCA shows minimal plaque in the vessel. There was no significant intimal thickening noted in the RIGHT carotid artery. There was no significant intimal thickening in the LEFT carotid artery.  The RIGHT CCA shows peak systolic velocity of 60 cm per second. The end diastolic velocity is 18 cm per second on the RIGHT side. The RIGHT ICA shows peak systolic velocity of 75 per second. RIGHT sided ICA end diastolic velocity is 21 cm per second. The RIGHT ECA shows a peak systolic velocity of 69 cm per second. The ICA/CCA ratio is calculated to be 1.25. This suggests less than 50% stenosis. The Vertebral Artery shows antegrade flow.  The LEFT CCA shows peak systolic velocity of 63 cm per second. The end diastolic velocity is 19 cm per second on the LEFT side. The LEFT ICA shows peak systolic velocity of 59 per second. LEFT sided ICA end diastolic velocity is 21 cm per second. The LEFT ECA shows a peak systolic velocity of 62 cm per second. The ICA/CCA ratio is calculated to be 0.9. This suggests less than 50% stenosis. The Vertebral Artery shows antegrade flow.   Impression:    The RIGHT CAROTID shows less than 50% stenosis. The LEFT CAROTID shows less than 50% stenosis.  There is minimal plaque formation noted on the LEFT and minimal plaque on the RIGHT  side. Consider a repeat Carotid doppler if clinical situation and symptoms warrant in 6-12 months. Patient should be encouraged to change lifestyles such as smoking cessation, regular exercise and dietary modification. Use of statins in the right clinical setting and ASA is encouraged.  Allyne Gee, MD [facility] Pulmonary Critical Care Medicine

## 2020-06-09 DIAGNOSIS — H2513 Age-related nuclear cataract, bilateral: Secondary | ICD-10-CM | POA: Diagnosis not present

## 2020-06-16 ENCOUNTER — Other Ambulatory Visit: Payer: Self-pay | Admitting: Unknown Physician Specialty

## 2020-06-16 DIAGNOSIS — E041 Nontoxic single thyroid nodule: Secondary | ICD-10-CM | POA: Diagnosis not present

## 2020-06-24 ENCOUNTER — Other Ambulatory Visit: Payer: Self-pay

## 2020-06-24 ENCOUNTER — Ambulatory Visit
Admission: RE | Admit: 2020-06-24 | Discharge: 2020-06-24 | Disposition: A | Discharge status: Home / Self Care | Payer: Medicare PPO | Source: Ambulatory Visit | Attending: Unknown Physician Specialty | Admitting: Unknown Physician Specialty

## 2020-06-24 DIAGNOSIS — E041 Nontoxic single thyroid nodule: Secondary | ICD-10-CM | POA: Insufficient documentation

## 2020-06-24 DIAGNOSIS — E042 Nontoxic multinodular goiter: Secondary | ICD-10-CM | POA: Diagnosis not present

## 2020-07-01 ENCOUNTER — Other Ambulatory Visit: Payer: Self-pay | Admitting: Hospice and Palliative Medicine

## 2020-07-01 DIAGNOSIS — E041 Nontoxic single thyroid nodule: Secondary | ICD-10-CM | POA: Diagnosis not present

## 2020-07-01 DIAGNOSIS — I10 Essential (primary) hypertension: Secondary | ICD-10-CM

## 2020-07-06 ENCOUNTER — Other Ambulatory Visit: Payer: Self-pay | Admitting: Unknown Physician Specialty

## 2020-07-06 DIAGNOSIS — E041 Nontoxic single thyroid nodule: Secondary | ICD-10-CM

## 2020-07-09 ENCOUNTER — Other Ambulatory Visit: Payer: Self-pay

## 2020-07-09 ENCOUNTER — Ambulatory Visit
Admission: RE | Admit: 2020-07-09 | Discharge: 2020-07-09 | Disposition: A | Discharge status: Home / Self Care | Payer: Medicare PPO | Source: Ambulatory Visit | Attending: Unknown Physician Specialty | Admitting: Unknown Physician Specialty

## 2020-07-09 DIAGNOSIS — E042 Nontoxic multinodular goiter: Secondary | ICD-10-CM | POA: Diagnosis not present

## 2020-07-09 DIAGNOSIS — D440 Neoplasm of uncertain behavior of thyroid gland: Secondary | ICD-10-CM | POA: Diagnosis not present

## 2020-07-09 DIAGNOSIS — E041 Nontoxic single thyroid nodule: Secondary | ICD-10-CM | POA: Diagnosis not present

## 2020-07-10 ENCOUNTER — Ambulatory Visit: Payer: Medicare PPO | Admitting: Hospice and Palliative Medicine

## 2020-07-10 LAB — CYTOLOGY - NON PAP

## 2020-07-28 ENCOUNTER — Encounter: Payer: Self-pay | Admitting: Unknown Physician Specialty

## 2020-07-28 LAB — CYTOLOGY - NON PAP

## 2020-08-03 DIAGNOSIS — D440 Neoplasm of uncertain behavior of thyroid gland: Secondary | ICD-10-CM | POA: Diagnosis not present

## 2020-08-04 ENCOUNTER — Telehealth: Payer: Self-pay

## 2020-08-04 NOTE — Telephone Encounter (Signed)
Nadine from [facility] office called and requested thyroid labs on pt due to pt having a total thyroidectomy which is 99% malignancy.  I faxed the copy of thyroid labs to [facility]...[phone]

## 2020-10-05 ENCOUNTER — Other Ambulatory Visit: Payer: Self-pay

## 2020-10-05 ENCOUNTER — Telehealth: Payer: Self-pay

## 2020-10-05 DIAGNOSIS — I10 Essential (primary) hypertension: Secondary | ICD-10-CM

## 2020-10-05 MED ORDER — HYDROCHLOROTHIAZIDE 12.5 MG PO TABS: 12.5000 mg | ORAL_TABLET | Freq: Every day | ORAL | 0 refills | Status: DC

## 2020-10-05 NOTE — Telephone Encounter (Signed)
Lvm to return my call to schedule follow up appointment-Toni

## 2020-10-08 ENCOUNTER — Ambulatory Visit (INDEPENDENT_AMBULATORY_CARE_PROVIDER_SITE_OTHER): Payer: Medicare PPO | Admitting: Nurse Practitioner

## 2020-10-08 ENCOUNTER — Other Ambulatory Visit: Payer: Self-pay

## 2020-10-08 ENCOUNTER — Encounter: Payer: Self-pay | Admitting: Nurse Practitioner

## 2020-10-08 VITALS — BP 159/95 | HR 67 | Temp 97.1°F | Resp 16 | Ht 61.0 in | Wt 152.2 lb

## 2020-10-08 DIAGNOSIS — E041 Nontoxic single thyroid nodule: Secondary | ICD-10-CM | POA: Diagnosis not present

## 2020-10-08 DIAGNOSIS — M79621 Pain in right upper arm: Secondary | ICD-10-CM | POA: Diagnosis not present

## 2020-10-08 DIAGNOSIS — E782 Mixed hyperlipidemia: Secondary | ICD-10-CM | POA: Diagnosis not present

## 2020-10-08 DIAGNOSIS — I700 Atherosclerosis of aorta: Secondary | ICD-10-CM | POA: Diagnosis not present

## 2020-10-08 DIAGNOSIS — I10 Essential (primary) hypertension: Secondary | ICD-10-CM | POA: Diagnosis not present

## 2020-10-08 DIAGNOSIS — Z1231 Encounter for screening mammogram for malignant neoplasm of breast: Secondary | ICD-10-CM | POA: Diagnosis not present

## 2020-10-08 DIAGNOSIS — I77819 Aortic ectasia, unspecified site: Secondary | ICD-10-CM

## 2020-10-08 DIAGNOSIS — G4733 Obstructive sleep apnea (adult) (pediatric): Secondary | ICD-10-CM | POA: Diagnosis not present

## 2020-10-08 NOTE — Progress Notes (Signed)
[facility] [place], [place] [address]  Internal MEDICINE  Office Visit Note  Patient Name: Hailey Wong  [id number]  [id number]  Date of Service: 10/17/2020  Chief Complaint  Patient presents with   Follow-up    Right arm feels sore, discuss surgery    Sleep Apnea   Hypertension   Quality Metric Gaps    Shingrix, colonoscopy     HPI Hailey Wong presents for a follow up visit to discuss sore right arm, upcoming thyroid surgery, sleep apnea and hypertension. He has a thyroidectomy scheduled for 10/20/20. Previously, a thyroid nodule was found so FNA biopsy was performed and the sample was ThyroSeq positive which means that the probability of the sample being cancerous is high (>99%).   Her right arm feels sore, the upper arm feels like it pulls when she lifts her arm. It has felt like this for a couple of months and has gradually gotten worse. The right arm is the arm she holds her purse with.  She has sleep apnea, she did the sleep study but declined the CPAP when told she needed one. She states that it was too loud and she knew she would never be able to sleep with it on, it is too disruptive per patient.    Current Medication: Outpatient Encounter Medications as of 10/08/2020  Medication Sig   Calcium Carb-Cholecalciferol (CALCIUM 500 + D PO) Take 1 tablet by mouth 2 (two) times daily.   fluticasone (FLONASE) 50 MCG/ACT nasal spray Place 1 spray into both nostrils daily as needed for allergies or rhinitis.   hydrochlorothiazide (HYDRODIURIL) 12.5 MG tablet Take 1 tablet (12.5 mg total) by mouth daily.   lisinopril (ZESTRIL) 10 MG tablet TAKE 1 TABLET BY MOUTH TWICE DAILY (Patient taking differently: Take 5-10 mg by mouth See admin instructions. Take 10 mg in the morning and 5 mg at night)   rosuvastatin (CRESTOR) 5 MG tablet Take 1 tablet (5 mg total) by mouth daily. (Patient taking differently: Take 5 mg by mouth at bedtime.)   [DISCONTINUED] ergocalciferol  (DRISDOL) 1.25 MG (50000 UT) capsule Take 1 capsule (50,000 Units total) by mouth once a week. (Patient not taking: Reported on 10/07/2020)   [DISCONTINUED] Polyvinyl Alcohol-Povidone (REFRESH OP) Place 1 drop into both eyes daily as needed (dry eyes). (Patient not taking: Reported on 10/08/2020)   No facility-administered encounter medications on file as of 10/08/2020.    Surgical History: Past Surgical History:  Procedure Laterality Date   BROW LIFT Bilateral 12/13/2019   Procedure: BLEPHAROPLASTY UPPER EYELID; W/EXCESS SKIN BLEPHAROPTOSIS REPAIR; RESECT EX;  Surgeon: Karle Starch, MD;  Location: [place];  Service: Ophthalmology;  Laterality: Bilateral;   right ankle surgery  [place] History: Past Medical History:  Diagnosis Date   Chronic cough    Hypertension    Mild cardiomegaly    Sinus trouble    Sleep apnea    no CPAP    Family History: Family History  Problem Relation Age of Onset   Hypertension Mother    Pulmonary fibrosis Father    Breast cancer Sister     Social History   Socioeconomic History   Marital status: Married    Spouse name: Not on file   Number of children: Not on file   Years of education: Not on file   Highest education level: Not on file  Occupational History   Not on file  Tobacco Use  Smoking status: Every Day    Packs/day: 1.00    Years: 50.00    Pack years: 50.00    Types: Cigarettes   Smokeless tobacco: Never  Vaping Use   Vaping Use: Never used  Substance and Sexual Activity   Alcohol use: Yes    Alcohol/week: 7.0 standard drinks    Types: 7 Glasses of wine per week    Comment: wine daily   Drug use: Never   Sexual activity: Not on file  Other Topics Concern   Not on file  Social History Narrative   Not on file   Social Determinants of Health   Financial Resource Strain: Not on file  Food Insecurity: Not on file  Transportation Needs: Not on file  Physical Activity: Not on  file  Stress: Not on file  Social Connections: Not on file  Intimate Partner Violence: Not on file      Review of Systems  Constitutional:  Negative for chills, fatigue and unexpected weight change.  HENT:  Negative for congestion, rhinorrhea, sneezing and sore throat.   Eyes:  Negative for redness.  Respiratory:  Negative for cough, chest tightness and shortness of breath.   Cardiovascular:  Negative for chest pain and palpitations.  Gastrointestinal:  Negative for abdominal pain, constipation, diarrhea, nausea and vomiting.  Genitourinary:  Negative for dysuria and frequency.  Musculoskeletal:  Negative for arthralgias, back pain, joint swelling and neck pain.  Skin:  Negative for rash.  Neurological: Negative.  Negative for tremors and numbness.  Hematological:  Negative for adenopathy. Does not bruise/bleed easily.  Psychiatric/Behavioral:  Negative for behavioral problems (Depression), sleep disturbance and suicidal ideas. The patient is not nervous/anxious.    Vital Signs: BP (!) 159/95   Pulse 67   Temp (!) 97.1 F (36.2 C)   Resp 16   Ht 5\' 1"  (1.549 m)   Wt 152 lb 3.2 oz (69 kg)   SpO2 98%   BMI 28.76 kg/m    Physical Exam Vitals reviewed.  Constitutional:      General: She is not in acute distress.    Appearance: Normal appearance. She is not ill-appearing.  Cardiovascular:     Rate and Rhythm: Normal rate and regular rhythm.     Pulses: Normal pulses.     Heart sounds: Normal heart sounds.  Pulmonary:     Effort: Pulmonary effort is normal.     Breath sounds: Normal breath sounds.  Skin:    General: Skin is warm and dry.     Capillary Refill: Capillary refill takes less than 2 seconds.  Neurological:     Mental Status: She is alert and oriented to person, place, and time.    Assessment/Plan: 1. Pain in right upper arm Sore right upper arm, feels like its pulling, right arm weakness and pain in the anterior shoulder joint. Possible impingement or  biceps tendinitis. Patient declines any further intervention at this time, wants to wait until after her thyroidectomy. Most likely will need xray of right shoulder, physical therapy and/or referral to orthopedic.   2. Obstructive sleep apnea Does not wear CPAP, had the study but declined the CPAP even though she qualified because the machine is loud and disruptive. She reports she sleeps fine as long as she is in the recliner.   3. Essential hypertension Blood pressure not under optimal control. Patient reports her blood pressure is higher when she is in the clinic. She is on lisinopril and hydrochlorothiazide. If her blood pressure remains elevated  at her follow up visit, dose adjustment may be necessary.  4. Acquired dilation of ascending aorta and aortic root (HCC) Abnormal echocardiogram from 2020 showed dilation of the ascending aorta and the aortic root. Repeat echo ordered to assess for progression. - ECHOCARDIOGRAM COMPLETE; Future  5. Thyroid nodule The nodule was biopsied and the cells were determined to be cancerous. She is scheduled for a thyroidectomy on 10/20/20.  6. Aortic atherosclerosis ([place]) Echo ordered to assess for progression. Taking rosuvastatin. - ECHOCARDIOGRAM COMPLETE; Future  7. Mixed hyperlipidemia Taking rosuvastatin, may need to check her lipid panel after surgery.  8. Encounter for screening mammogram for malignant neoplasm of breast Screening mammogram ordered.  - MM Digital Screening; Future   General Counseling: special ranes understanding of the findings of todays visit and agrees with plan of treatment. I have discussed any further diagnostic evaluation that may be needed or ordered today. We also reviewed her medications today. she has been encouraged to call the office with any questions or concerns that should arise related to todays visit.    Orders Placed This Encounter  Procedures   MM Digital Screening   ECHOCARDIOGRAM COMPLETE    No  orders of the defined types were placed in this encounter.   Return for F/U previously scheduled for 12/01/2020.   Total time spent:30 Minutes Time spent includes review of chart, medications, test results, and follow up plan with the patient.   [place] Controlled Substance Database was reviewed by me.  This patient was seen by Jonetta Osgood, FNP-C in collaboration with Dr. Clayborn Bigness as a part of collaborative care agreement.   Randa Riss R. Valetta Fuller, MSN, FNP-C Internal medicine

## 2020-10-13 ENCOUNTER — Encounter
Admission: RE | Admit: 2020-10-13 | Discharge: 2020-10-13 | Disposition: A | Discharge status: Home / Self Care | Payer: Medicare PPO | Source: Ambulatory Visit | Attending: Unknown Physician Specialty | Admitting: Unknown Physician Specialty

## 2020-10-13 ENCOUNTER — Other Ambulatory Visit: Payer: Self-pay

## 2020-10-13 NOTE — Patient Instructions (Addendum)
Your procedure is scheduled on: 10/20/20 Report to [place]. To find out your arrival time please call [phone] between 1PM - 3PM on 10/19/20.  Remember: Instructions that are not followed completely may result in serious medical risk, up to and including death, or upon the discretion of your surgeon and anesthesiologist your surgery may need to be rescheduled.     _X__ 1. Do not eat food after midnight the night before your procedure.                 No gum chewing or hard candies. You may drink clear liquids up to 2 hours                 before you are scheduled to arrive for your surgery- DO not drink clear                 liquids within 2 hours of the start of your surgery.                 Clear Liquids include:  water, apple juice without pulp, clear carbohydrate                 drink such as Clearfast or Gatorade, Black Coffee or Tea (Do not add                 anything to coffee or tea). Diabetics water only  __X__2.  On the morning of surgery brush your teeth with toothpaste and water, you                 may rinse your mouth with mouthwash if you wish.  Do not swallow any              toothpaste of mouthwash.     _X__ 3.  No Alcohol for 24 hours before or after surgery.   _X__ 4.  Do Not Smoke or use e-cigarettes For 24 Hours Prior to Your Surgery.                 Do not use any chewable tobacco products for at least 6 hours prior to                 surgery.  ____  5.  Bring all medications with you on the day of surgery if instructed.   __X__  6.  Notify your doctor if there is any change in your medical condition      (cold, fever, infections).     Do not wear jewelry, make-up, hairpins, clips or nail polish. Do not wear lotions, powders, or perfumes.  Do not shave 48 hours prior to surgery. Men may shave face and neck. Do not bring valuables to the hospital.    [facility] is not responsible for any belongings or  valuables.  Contacts, dentures/partials or body piercings may not be worn into surgery. Bring a case for your contacts, glasses or hearing aids, a denture cup will be supplied. Leave your suitcase in the car. After surgery it may be brought to your room. For patients admitted to the hospital, discharge time is determined by your treatment team.   Patients discharged the day of surgery will not be allowed to drive home.   Please read over the following fact sheets that you were given:   MRSA Information, CHG soap  __X__ Take these medicines the morning of surgery with A SIP OF WATER:  1. none  2.   3.   4.  5.  6.  ____ Fleet Enema (as directed)   __X__ Use CHG Soap/SAGE wipes as directed  ____ Use inhalers on the day of surgery  ____ Stop metformin/Janumet/Farxiga 2 days prior to surgery    ____ Take 1/2 of usual insulin dose the night before surgery. No insulin the morning          of surgery.   ____ Stop Blood Thinners Coumadin/Plavix/Xarelto/Pleta/Pradaxa/Eliquis/Effient/Aspirin  on   Or contact your Surgeon, Cardiologist or Medical Doctor regarding  ability to stop your blood thinners  __X__ Stop Anti-inflammatories 7 days before surgery such as Advil, Ibuprofen, Motrin,  BC or Goodies Powder, Naprosyn, Naproxen, Aleve, Aspirin    __X__ Stop all herbal supplements, fish oil or vitamin E until after surgery.    ____ Bring C-Pap to the hospital.

## 2020-10-16 ENCOUNTER — Other Ambulatory Visit
Admission: RE | Admit: 2020-10-16 | Discharge: 2020-10-16 | Disposition: A | Discharge status: Home / Self Care | Payer: Medicare PPO | Source: Ambulatory Visit | Attending: Unknown Physician Specialty | Admitting: Unknown Physician Specialty

## 2020-10-16 ENCOUNTER — Other Ambulatory Visit: Payer: Self-pay

## 2020-10-16 DIAGNOSIS — Z20822 Contact with and (suspected) exposure to covid-19: Secondary | ICD-10-CM | POA: Insufficient documentation

## 2020-10-16 DIAGNOSIS — Z01818 Encounter for other preprocedural examination: Secondary | ICD-10-CM | POA: Diagnosis not present

## 2020-10-16 DIAGNOSIS — I10 Essential (primary) hypertension: Secondary | ICD-10-CM | POA: Insufficient documentation

## 2020-10-16 DIAGNOSIS — Z01810 Encounter for preprocedural cardiovascular examination: Secondary | ICD-10-CM | POA: Diagnosis not present

## 2020-10-16 LAB — COMPREHENSIVE METABOLIC PANEL
ALT: 12 U/L (ref 0–44)
AST: 19 U/L (ref 15–41)
Albumin: 3.9 g/dL (ref 3.5–5.0)
Alkaline Phosphatase: 57 U/L (ref 38–126)
Anion gap: 8 (ref 5–15)
BUN: 8 mg/dL (ref 8–23)
CO2: 28 mmol/L (ref 22–32)
Calcium: 9.1 mg/dL (ref 8.9–10.3)
Chloride: 93 mmol/L — ABNORMAL LOW (ref 98–111)
Creatinine, Ser: 0.52 mg/dL (ref 0.44–1.00)
GFR, Estimated: >60 mL/min (ref >60)
Glucose, Bld: 115 mg/dL — ABNORMAL HIGH (ref 70–99)
Potassium: 3.4 mmol/L — ABNORMAL LOW (ref 3.5–5.1)
Sodium: 129 mmol/L — ABNORMAL LOW (ref 135–145)
Total Bilirubin: 0.8 mg/dL (ref 0.3–1.2)
Total Protein: 6.4 g/dL — ABNORMAL LOW (ref 6.5–8.1)

## 2020-10-16 LAB — CBC
HCT: 39.7 % (ref 36.0–46.0)
Hemoglobin: 13.9 g/dL (ref 12.0–15.0)
MCH: 30.9 pg (ref 26.0–34.0)
MCHC: 35 g/dL (ref 30.0–36.0)
MCV: 88.2 fL (ref 80.0–100.0)
Platelets: 257 10*3/uL (ref 150–400)
RBC: 4.5 MIL/uL (ref 3.87–5.11)
RDW: 13.9 % (ref 11.5–15.5)
WBC: 8.8 10*3/uL (ref 4.0–10.5)
nRBC: 0 % (ref 0.0–0.2)

## 2020-10-16 LAB — SARS CORONAVIRUS 2 (TAT 6-24 HRS): SARS Coronavirus 2: NEGATIVE

## 2020-10-20 ENCOUNTER — Ambulatory Visit: Payer: Medicare PPO | Admitting: Anesthesiology

## 2020-10-20 ENCOUNTER — Encounter: Payer: Self-pay | Admitting: Unknown Physician Specialty

## 2020-10-20 ENCOUNTER — Observation Stay
Admission: RE | Admit: 2020-10-20 | Discharge: 2020-10-21 | Disposition: A | Discharge status: Home / Self Care | Payer: Medicare PPO | Attending: Unknown Physician Specialty | Admitting: Unknown Physician Specialty

## 2020-10-20 ENCOUNTER — Encounter
Admission: RE | Disposition: A | Discharge status: Home / Self Care | Payer: Self-pay | Source: Home / Self Care | Attending: Unknown Physician Specialty

## 2020-10-20 ENCOUNTER — Other Ambulatory Visit: Payer: Self-pay

## 2020-10-20 DIAGNOSIS — C73 Malignant neoplasm of thyroid gland: Secondary | ICD-10-CM | POA: Diagnosis not present

## 2020-10-20 DIAGNOSIS — G4730 Sleep apnea, unspecified: Secondary | ICD-10-CM | POA: Diagnosis not present

## 2020-10-20 DIAGNOSIS — E890 Postprocedural hypothyroidism: Secondary | ICD-10-CM

## 2020-10-20 DIAGNOSIS — D440 Neoplasm of uncertain behavior of thyroid gland: Secondary | ICD-10-CM | POA: Diagnosis not present

## 2020-10-20 HISTORY — PX: THYROIDECTOMY: SHX17

## 2020-10-20 LAB — CALCIUM: Calcium: 8.5 mg/dL — ABNORMAL LOW (ref 8.9–10.3)

## 2020-10-20 SURGERY — THYROIDECTOMY
Anesthesia: General | Laterality: Bilateral

## 2020-10-20 MED ORDER — PROPOFOL 10 MG/ML IV BOLUS
INTRAVENOUS | Status: AC
  Filled 2020-10-20: qty 20

## 2020-10-20 MED ORDER — EPHEDRINE 5 MG/ML INJ
INTRAVENOUS | Status: AC
  Filled 2020-10-20: qty 10

## 2020-10-20 MED ORDER — LIDOCAINE-EPINEPHRINE 1 %-1:100000 IJ SOLN
INTRAMUSCULAR | Status: AC
  Filled 2020-10-20: qty 1

## 2020-10-20 MED ORDER — ACETAMINOPHEN 10 MG/ML IV SOLN
INTRAVENOUS | Status: DC | PRN
  Administered 2020-10-20: 1000 mg via INTRAVENOUS

## 2020-10-20 MED ORDER — DEXAMETHASONE SODIUM PHOSPHATE 10 MG/ML IJ SOLN
INTRAMUSCULAR | Status: DC | PRN
  Administered 2020-10-20: 10 mg via INTRAVENOUS

## 2020-10-20 MED ORDER — ONDANSETRON HCL 4 MG/2ML IJ SOLN
INTRAMUSCULAR | Status: AC
  Filled 2020-10-20: qty 2

## 2020-10-20 MED ORDER — LIDOCAINE HCL (CARDIAC) PF 100 MG/5ML IV SOSY
PREFILLED_SYRINGE | INTRAVENOUS | Status: DC | PRN
  Administered 2020-10-20: 80 mg via INTRAVENOUS

## 2020-10-20 MED ORDER — FENTANYL CITRATE (PF) 100 MCG/2ML IJ SOLN
INTRAMUSCULAR | Status: DC | PRN
  Administered 2020-10-20 (×2): 50 ug via INTRAVENOUS

## 2020-10-20 MED ORDER — DEXAMETHASONE SODIUM PHOSPHATE 10 MG/ML IJ SOLN
INTRAMUSCULAR | Status: AC
  Filled 2020-10-20: qty 1

## 2020-10-20 MED ORDER — OXYCODONE HCL 5 MG/5ML PO SOLN: 5.0000 mg | Freq: Once | ORAL | Status: DC | PRN

## 2020-10-20 MED ORDER — PROPOFOL 10 MG/ML IV BOLUS
INTRAVENOUS | Status: DC | PRN
  Administered 2020-10-20: 50 mg via INTRAVENOUS
  Administered 2020-10-20: 150 mg via INTRAVENOUS

## 2020-10-20 MED ORDER — LISINOPRIL 10 MG PO TABS
10.0000 mg | ORAL_TABLET | Freq: Every day | ORAL | Status: DC
  Administered 2020-10-21: 10 mg via ORAL
  Filled 2020-10-20: qty 1

## 2020-10-20 MED ORDER — ACETAMINOPHEN 160 MG/5ML PO SOLN
650.0000 mg | ORAL | Status: DC | PRN
  Filled 2020-10-20: qty 20.3

## 2020-10-20 MED ORDER — FAMOTIDINE 20 MG PO TABS
ORAL_TABLET | ORAL | Status: AC
  Filled 2020-10-20: qty 1

## 2020-10-20 MED ORDER — FENTANYL CITRATE (PF) 100 MCG/2ML IJ SOLN
INTRAMUSCULAR | Status: AC
  Filled 2020-10-20: qty 2

## 2020-10-20 MED ORDER — CHLORHEXIDINE GLUCONATE 0.12 % MT SOLN
15.0000 mL | Freq: Once | OROMUCOSAL | Status: AC
  Administered 2020-10-20: 15 mL via OROMUCOSAL

## 2020-10-20 MED ORDER — BACITRACIN ZINC 500 UNIT/GM EX OINT
1.0000 "application " | TOPICAL_OINTMENT | Freq: Three times a day (TID) | CUTANEOUS | Status: DC
  Administered 2020-10-20 – 2020-10-21 (×2): 1 via TOPICAL
  Filled 2020-10-20 (×4): qty 0.9

## 2020-10-20 MED ORDER — CHLORHEXIDINE GLUCONATE 0.12 % MT SOLN
OROMUCOSAL | Status: AC
  Filled 2020-10-20: qty 15

## 2020-10-20 MED ORDER — FENTANYL CITRATE (PF) 100 MCG/2ML IJ SOLN: 25.0000 ug | INTRAMUSCULAR | Status: DC | PRN

## 2020-10-20 MED ORDER — ONDANSETRON HCL 4 MG/2ML IJ SOLN
INTRAMUSCULAR | Status: DC | PRN
  Administered 2020-10-20: 4 mg via INTRAVENOUS

## 2020-10-20 MED ORDER — CALCIUM CARBONATE-VITAMIN D 500-200 MG-UNIT PO TABS
2.0000 | ORAL_TABLET | Freq: Two times a day (BID) | ORAL | Status: DC
  Administered 2020-10-20 – 2020-10-21 (×2): 2 via ORAL
  Filled 2020-10-20 (×2): qty 2

## 2020-10-20 MED ORDER — HYDROCHLOROTHIAZIDE 10 MG/ML ORAL SUSPENSION
6.2500 mg | Freq: Every day | ORAL | Status: DC
  Administered 2020-10-21: 6.25 mg via ORAL
  Filled 2020-10-20 (×4): qty 1.25

## 2020-10-20 MED ORDER — ORAL CARE MOUTH RINSE: 15.0000 mL | Freq: Once | OROMUCOSAL | Status: AC

## 2020-10-20 MED ORDER — ACETAMINOPHEN 650 MG RE SUPP
650.0000 mg | RECTAL | Status: DC | PRN
  Filled 2020-10-20: qty 1

## 2020-10-20 MED ORDER — MORPHINE SULFATE (PF) 2 MG/ML IV SOLN: 2.0000 mg | INTRAVENOUS | Status: DC | PRN

## 2020-10-20 MED ORDER — LACTATED RINGERS IV SOLN: INTRAVENOUS | Status: DC

## 2020-10-20 MED ORDER — SUCCINYLCHOLINE CHLORIDE 20 MG/ML IJ SOLN
INTRAMUSCULAR | Status: DC | PRN
  Administered 2020-10-20: 100 mg via INTRAVENOUS

## 2020-10-20 MED ORDER — KCL IN DEXTROSE-NACL 20-5-0.45 MEQ/L-%-% IV SOLN
INTRAVENOUS | Status: DC
  Filled 2020-10-20 (×2): qty 1000

## 2020-10-20 MED ORDER — PHENYLEPHRINE HCL (PRESSORS) 10 MG/ML IV SOLN
INTRAVENOUS | Status: DC | PRN
  Administered 2020-10-20: 200 ug via INTRAVENOUS
  Administered 2020-10-20: 100 ug via INTRAVENOUS

## 2020-10-20 MED ORDER — LIDOCAINE HCL (PF) 2 % IJ SOLN
INTRAMUSCULAR | Status: AC
  Filled 2020-10-20: qty 5

## 2020-10-20 MED ORDER — ONDANSETRON HCL 4 MG PO TABS: 4.0000 mg | ORAL_TABLET | ORAL | Status: DC | PRN

## 2020-10-20 MED ORDER — SUCCINYLCHOLINE CHLORIDE 200 MG/10ML IV SOSY
PREFILLED_SYRINGE | INTRAVENOUS | Status: AC
  Filled 2020-10-20: qty 10

## 2020-10-20 MED ORDER — HYDROCODONE-ACETAMINOPHEN 5-325 MG PO TABS
1.0000 | ORAL_TABLET | ORAL | Status: DC | PRN
Start: 2020-10-20 — End: 2020-10-21

## 2020-10-20 MED ORDER — ONDANSETRON HCL 4 MG/2ML IJ SOLN: 4.0000 mg | INTRAMUSCULAR | Status: DC | PRN

## 2020-10-20 MED ORDER — DEXMEDETOMIDINE (PRECEDEX) IN NS 20 MCG/5ML (4 MCG/ML) IV SYRINGE
PREFILLED_SYRINGE | INTRAVENOUS | Status: AC
  Filled 2020-10-20: qty 5

## 2020-10-20 MED ORDER — ACETAMINOPHEN 10 MG/ML IV SOLN
INTRAVENOUS | Status: AC
  Filled 2020-10-20: qty 100

## 2020-10-20 MED ORDER — OXYCODONE HCL 5 MG PO TABS: 5.0000 mg | ORAL_TABLET | Freq: Once | ORAL | Status: DC | PRN

## 2020-10-20 MED ORDER — DEXMEDETOMIDINE (PRECEDEX) IN NS 20 MCG/5ML (4 MCG/ML) IV SYRINGE
PREFILLED_SYRINGE | INTRAVENOUS | Status: DC | PRN
  Administered 2020-10-20: 4 ug via INTRAVENOUS
  Administered 2020-10-20: 8 ug via INTRAVENOUS

## 2020-10-20 MED ORDER — FAMOTIDINE 20 MG PO TABS
20.0000 mg | ORAL_TABLET | Freq: Once | ORAL | Status: AC
  Administered 2020-10-20: 20 mg via ORAL

## 2020-10-20 MED ORDER — SODIUM CHLORIDE 0.9 % IV SOLN
INTRAVENOUS | Status: DC | PRN
  Administered 2020-10-20: 50 ug/min via INTRAVENOUS

## 2020-10-20 MED ORDER — EPHEDRINE SULFATE 50 MG/ML IJ SOLN
INTRAMUSCULAR | Status: DC | PRN
  Administered 2020-10-20 (×2): 10 mg via INTRAVENOUS

## 2020-10-20 MED ORDER — PHENYLEPHRINE HCL (PRESSORS) 10 MG/ML IV SOLN
INTRAVENOUS | Status: AC
  Filled 2020-10-20: qty 1

## 2020-10-20 MED ORDER — LIDOCAINE-EPINEPHRINE 1 %-1:100000 IJ SOLN
INTRAMUSCULAR | Status: DC | PRN
  Administered 2020-10-20: 4 mL

## 2020-10-20 SURGICAL SUPPLY — 39 items
BLADE SURG 15 STRL LF DISP TIS (BLADE) ×1 IMPLANT
BLADE SURG 15 STRL SS (BLADE) ×2
BULB RESERV EVAC DRAIN JP 100C (MISCELLANEOUS) ×6 IMPLANT
CANISTER SUCT 1200ML W/VALVE (MISCELLANEOUS) ×3 IMPLANT
CORD BIP STRL DISP 12FT (MISCELLANEOUS) ×3 IMPLANT
COVER WAND RF STERILE (DRAPES) ×3 IMPLANT
DERMABOND ADVANCED (GAUZE/BANDAGES/DRESSINGS) ×2
DERMABOND ADVANCED .7 DNX12 (GAUZE/BANDAGES/DRESSINGS) ×1 IMPLANT
DRAIN JP 10F RND SILICONE (MISCELLANEOUS) ×6 IMPLANT
DRAPE MAG INST 16X20 L/F (DRAPES) ×3 IMPLANT
DRSG TEGADERM 2-3/8X2-3/4 SM (GAUZE/BANDAGES/DRESSINGS) ×3 IMPLANT
ELECT LARYNGEAL 6/7 (MISCELLANEOUS)
ELECT LARYNGEAL 8/9 (MISCELLANEOUS)
ELECT REM PT RETURN 9FT ADLT (ELECTROSURGICAL) ×3
ELECTRODE LARYNGEAL 6/7 (MISCELLANEOUS) IMPLANT
ELECTRODE LARYNGEAL 8/9 (MISCELLANEOUS) IMPLANT
ELECTRODE REM PT RTRN 9FT ADLT (ELECTROSURGICAL) ×1 IMPLANT
FORCEPS JEWEL BIP 4-3/4 STR (INSTRUMENTS) ×3 IMPLANT
GAUZE 4X4 16PLY ~~LOC~~+RFID DBL (SPONGE) ×6 IMPLANT
GLOVE SURG ENC MOIS LTX SZ7.5 (GLOVE) ×6 IMPLANT
GOWN STRL REUS W/ TWL LRG LVL3 (GOWN DISPOSABLE) ×3 IMPLANT
GOWN STRL REUS W/TWL LRG LVL3 (GOWN DISPOSABLE) ×6
HEMOSTAT SURGICEL 2X3 (HEMOSTASIS) ×3 IMPLANT
HOOK STAY BLUNT/RETRACTOR 5M (MISCELLANEOUS) IMPLANT
KIT TURNOVER KIT A (KITS) ×3 IMPLANT
LABEL OR SOLS (LABEL) ×3 IMPLANT
MANIFOLD NEPTUNE II (INSTRUMENTS) IMPLANT
NS IRRIG 500ML POUR BTL (IV SOLUTION) ×3 IMPLANT
PACK HEAD/NECK (MISCELLANEOUS) ×3 IMPLANT
PROBE NEUROSIGN BIPOL (MISCELLANEOUS) ×1 IMPLANT
PROBE NEUROSIGN BIPOLAR (MISCELLANEOUS) ×2
SHEARS HARMONIC 9CM CVD (BLADE) ×3 IMPLANT
SPONGE KITTNER 5P (MISCELLANEOUS) ×6 IMPLANT
STAPLER SKIN PROX 35W (STAPLE) ×3 IMPLANT
SUT SILK 2 0 (SUTURE) ×2
SUT SILK 2 0 SH (SUTURE) ×3 IMPLANT
SUT SILK 2-0 18XBRD TIE 12 (SUTURE) ×1 IMPLANT
SUT VIC AB 4-0 RB1 18 (SUTURE) ×6 IMPLANT
SYSTEM CHEST DRAIN TLS 7FR (DRAIN) IMPLANT

## 2020-10-20 NOTE — Anesthesia Procedure Notes (Signed)
Procedure Name: Intubation Date/Time: 10/20/2020 7:31 AM Performed by: Doreen Salvage, CRNA Pre-anesthesia Checklist: Patient identified, Patient being monitored, Timeout performed, Emergency Drugs available and Suction available Patient Re-evaluated:Patient Re-evaluated prior to induction Oxygen Delivery Method: Circle system utilized Preoxygenation: Pre-oxygenation with 100% oxygen Induction Type: IV induction Ventilation: Mask ventilation without difficulty Laryngoscope Size: Mac, 3 and McGraph Grade View: Grade I Tube type: Oral Tube size: 7.0 mm Number of attempts: 1 Airway Equipment and Method: Stylet Placement Confirmation: ETT inserted through vocal cords under direct vision, positive ETCO2 and breath sounds checked- equal and bilateral Secured at: 21 cm Tube secured with: Tape Dental Injury: Teeth and Oropharynx as per pre-operative assessment

## 2020-10-20 NOTE — Transfer of Care (Signed)
Immediate Anesthesia Transfer of Care Note  Patient: Hailey Wong  Procedure(s) Performed: THYROIDECTOMY (Bilateral)  Patient Location: PACU  Anesthesia Type:General  Level of Consciousness: drowsy  Airway & Oxygen Therapy: Patient Spontanous Breathing and Patient connected to face mask oxygen  Post-op Assessment: Report given to RN and Post -op Vital signs reviewed and stable  Post vital signs: Reviewed and stable  Last Vitals:  Vitals Value Taken Time  BP 130/65 10/20/20 0939  Temp    Pulse 91 10/20/20 0941  Resp 16 10/20/20 0941  SpO2 100 % 10/20/20 0941  Vitals shown include unvalidated device data.  Last Pain:  Vitals:   10/20/20 0618  TempSrc: Temporal  PainSc: 0-No pain         Complications: No notable events documented.

## 2020-10-20 NOTE — Progress Notes (Signed)
10/20/2020 6:23 PM   [facility] [id number]  Post-Op Day 0    Temp:  [97.1 F (36.2 C)-98.6 F (37 C)] 98 F (36.7 C) (06/28 1741) Pulse Rate:  [62-92] 78 (06/28 1741) Resp:  [11-20] 16 (06/28 1741) BP: (120-155)/(65-89) 147/85 (06/28 1741) SpO2:  [85 %-100 %] 97 % (06/28 1741) Weight:  [68.9 kg-69.9 kg] 69.9 kg (06/28 1717),     Intake/Output Summary (Last 24 hours) at 10/20/2020 1823 Last data filed at 10/20/2020 1119 Gross per 24 hour  Intake 1500 ml  Output 410 ml  Net 1090 ml    Results for orders placed or performed during the hospital encounter of 10/20/20 (from the past 24 hour(s))  Calcium     Status: Abnormal   Collection Time: 10/20/20  4:34 PM  Result Value Ref Range   Calcium 8.5 (L) 8.9 - 10.3 mg/dL    SUBJECTIVE:  Feeling great, no pain, up and around to bathroom  OBJECTIVE:  Neck-drains intact and functioning, no hematoma, voice strong  IMPRESSION:  s/p total thyroidectomy, Ca 8.5  PLAN:  Will recheck Ca in am, she will be up and out of bed tonight, regular diet.  Possible DC tomorrow depending on Ca level.    Roena Malady 10/20/2020, 6:23 PM

## 2020-10-20 NOTE — H&P (Signed)
The patient's history has been reviewed, patient examined, no change in status, stable for surgery.  Questions were answered to the patients satisfaction.  

## 2020-10-20 NOTE — Anesthesia Postprocedure Evaluation (Signed)
Anesthesia Post Note  Patient: Hailey Wong  Procedure(s) Performed: THYROIDECTOMY (Bilateral)  Patient location during evaluation: PACU Anesthesia Type: General Level of consciousness: awake and alert Pain management: pain level controlled Vital Signs Assessment: post-procedure vital signs reviewed and stable Respiratory status: spontaneous breathing, nonlabored ventilation, respiratory function stable and patient connected to nasal cannula oxygen Cardiovascular status: blood pressure returned to baseline and stable Postop Assessment: no apparent nausea or vomiting Anesthetic complications: no   No notable events documented.   Last Vitals:  Vitals:   10/20/20 1100 10/20/20 1115  BP: 137/86 135/73  Pulse: 77 76  Resp: 20 11  Temp:  36.8 C  SpO2: 95% 96%    Last Pain:  Vitals:   10/20/20 1115  TempSrc:   PainSc: 0-No pain                 Precious Haws Analysa Nutting

## 2020-10-20 NOTE — Op Note (Signed)
OPERATIVE REPORT  DATE OF SURGERY: 10/20/2020  PATIENT:  Hailey Wong,  75 y.o. female  PRE-OPERATIVE DIAGNOSIS:  Neoplasm of uncertain behavior thyroid  POST-OPERATIVE DIAGNOSIS:  Neoplasm of uncertain behavior, thyroid.  PROCEDURE:  Procedure(s): Total THYROIDECTOMY; recurrent laryngeal nerve monitoring 2 hours  SURGEON:  Roena Malady, MD  ASSISTANTS: Richardson Landry  ANESTHESIA:   General   EBL: Less than 20 cc ml  DRAINS: XXX   LOCAL MEDICATIONS USED:  None  SPECIMEN: Total thyroid stitch right upper lobe  COUNTS:  Correct  PROCEDURE DETAILS: Hailey Wong was identified in the holding area taken the operating room placed in supine position.  She Was then intubated with a laryngeal monitoring endotracheal tube which remained on throughout the procedure.  With the patient intubated and sedated incision line was marked in a natural skin crease just below the cricoid cartilage.  A local anesthetic of 1% lidocaine 1 100000 units epinephrine was used to inject over the incision line.  A total of 4 cc was used.  The neck was then prepped and draped sterilely.  A 15 blade was used to incise down to and through the platysma muscle.  Hemostasis achieved using the Bovie cautery.  The trachea identified in the midline the strap muscles were divided using the Bovie cautery.  The midline raphae was identified.  Beginning on the right-hand side the strap muscles were retracted laterally from over the lateral aspect of the thyroid gland.  There was a palpable nodule in the lateral aspect the gland.  The superior pole was identified isolated and divided using the harmonic scalpel.  The gland was then gently medialized or multiple feeding vessels which were divided using the harmonic scalpel.  The gland was medialized the superior and inferior parathyroid glands were identified and left intact on the vascular pedicles.  Dissection proceeded in the tracheoesophageal groove where the recurrent laryngeal nerve  was identified stimulated and dissected up as it entered into the larynx.  The nerve remained intact throughout the procedure.  Gland was then gently medialized freeing up the right lobe of the thyroid there was an obvious palpable nodule within this lobe.  The left lobe was then dissected in similar fashion the strap muscles were retracted laterally the multiple feeding vessels were divided using the harmonic scalpel.  The superior pole was isolated and divided using harmonic scalpel.  This allowed the gland to be medialized.  Again the superior and inferior parathyroid glands were identified and dissected free and left on their vascular pedicles.  As the gland was retracted medially again the recurrent laryngeal nerve was identified in the tracheoesophageal groove.  This was stimulated and remained intact throughout the procedure.  The nerve was dissected superiorly as it entered the larynx and remained intact was then gently deep dissected releasing Berry's ligaments bilaterally entering it from the anterior wall of the trachea.  With the gland removed a stitch was placed in the right upper lobe.  The wound was then copiously irrigated with saline any small bleeding points were cauterized using the microbipolar.  The recurrent laryngeal nerves were stimulated bilaterally and appropriately stimulated the larynx via the nerve stimulating monitor.  With no active bleeding Surgicel was placed within each neurovascular bed #10 TLS drains were brought out the wound inferiorly.  The strap muscles were reapproximated using 4-0 Vicryl the platysma and subcutaneous tissues were closed using 4-0 Vicryl and skin was closed using Dermabond.  TLS drains were placed on suction drain.  Patient was then  returned to anesthesia where she was awakened in the operating type cart in stable condition  Specimen: Total thyroid    PATIENT DISPOSITION:  To PACU, stable, will be admitted overnight for observation and monitoring of  calcium levels.

## 2020-10-20 NOTE — Anesthesia Preprocedure Evaluation (Signed)
Anesthesia Evaluation  Patient identified by MRN, date of birth, ID band Patient awake    Reviewed: Allergy & Precautions, NPO status , Patient's Chart, lab work & pertinent test results  History of Anesthesia Complications Negative for: history of anesthetic complications  Airway Mallampati: III  TM Distance: <3 FB Neck ROM: limited    Dental  (+) Chipped, Poor Dentition   Pulmonary sleep apnea , COPD, Current Smoker and Patient abstained from smoking.,    Pulmonary exam normal        Cardiovascular Exercise Tolerance: Good hypertension, Normal cardiovascular exam     Neuro/Psych negative neurological ROS  negative psych ROS   GI/Hepatic negative GI ROS, Neg liver ROS, neg GERD  ,  Endo/Other  negative endocrine ROS  Renal/GU      Musculoskeletal   Abdominal   Peds  Hematology negative hematology ROS (+)   Anesthesia Other Findings Past Medical History: No date: Chronic cough No date: Hypertension No date: Mild cardiomegaly No date: Sinus trouble No date: Sleep apnea     Comment:  no CPAP  Past Surgical History: 12/13/2019: BROW LIFT; Bilateral     Comment:  Procedure: BLEPHAROPLASTY UPPER EYELID; W/EXCESS SKIN               BLEPHAROPTOSIS REPAIR; RESECT EX;  Surgeon: Karle Starch, MD;  Location: [facility];  Service:               Ophthalmology;  Laterality: Bilateral; 1997: right ankle surgery 1977: TUBAL LIGATION  BMI    Body Mass Index: 28.72 kg/m      Reproductive/Obstetrics negative OB ROS                             Anesthesia Physical Anesthesia Plan  ASA: 3  Anesthesia Plan: General ETT   Post-op Pain Management:    Induction: Intravenous  PONV Risk Score and Plan: Ondansetron, Dexamethasone, Midazolam and Treatment may vary due to age or medical condition  Airway Management Planned: Oral ETT  Additional Equipment:   Intra-op  Plan:   Post-operative Plan: Extubation in OR  Informed Consent: I have reviewed the patients History and Physical, chart, labs and discussed the procedure including the risks, benefits and alternatives for the proposed anesthesia with the patient or authorized representative who has indicated his/her understanding and acceptance.     Dental Advisory Given  Plan Discussed with: Anesthesiologist, CRNA and Surgeon  Anesthesia Plan Comments: (Patient consented for risks of anesthesia including but not limited to:  - adverse reactions to medications - damage to eyes, teeth, lips or other oral mucosa - nerve damage due to positioning  - sore throat or hoarseness - Damage to heart, brain, nerves, lungs, other parts of body or loss of life  Patient voiced understanding.)        Anesthesia Quick Evaluation

## 2020-10-21 ENCOUNTER — Encounter: Payer: Self-pay | Admitting: Unknown Physician Specialty

## 2020-10-21 DIAGNOSIS — C73 Malignant neoplasm of thyroid gland: Secondary | ICD-10-CM | POA: Diagnosis not present

## 2020-10-21 LAB — CALCIUM: Calcium: 9 mg/dL (ref 8.9–10.3)

## 2020-10-21 NOTE — Discharge Summary (Signed)
10/21/2020 8:06 AM  Dacono Cellar [id number]  Post-Op Day 1    Temp:  [98 F (36.7 C)-98.6 F (37 C)] 98.3 F (36.8 C) (06/29 0745) Pulse Rate:  [62-92] 62 (06/29 0745) Resp:  [11-20] 20 (06/29 0745) BP: (120-180)/(65-95) 180/94 (06/29 0745) SpO2:  [85 %-100 %] 100 % (06/29 0745) Weight:  [69.9 kg] 69.9 kg (06/28 1717),     Intake/Output Summary (Last 24 hours) at 10/21/2020 0806 Last data filed at 10/21/2020 0700 Gross per 24 hour  Intake 1618 ml  Output 470 ml  Net 1148 ml    Results for orders placed or performed during the hospital encounter of 10/20/20 (from the past 24 hour(s))  Calcium     Status: Abnormal   Collection Time: 10/20/20  4:34 PM  Result Value Ref Range   Calcium 8.5 (L) 8.9 - 10.3 mg/dL  Calcium     Status: None   Collection Time: 10/21/20  4:53 AM  Result Value Ref Range   Calcium 9.0 8.9 - 10.3 mg/dL    SUBJECTIVE:  Feeling great, no pain.  Voice good.  OBJECTIVE:  Drains with serosanguinous drainage only.  Neck flat no infection or hematoma.  Drains removed.    IMPRESSION:  s/p total thyroid.  Feeling great.  No pain meds taken.  Neck healing nicely.  Ca 9.0 this am.    PLAN:  Will DC to home.  She has her Ca meds at home she will continue those.  Cautioned her about symptoms of low calcium, if they ok she is to return to the ED for check of Ca she understands.  She is also go be aware of any acute neck swelling, avoid any heavy pulling or lifting.  I will followup with her in 10 days.    Roena Malady 10/21/2020, 8:06 AM

## 2020-10-22 LAB — SURGICAL PATHOLOGY

## 2020-10-23 ENCOUNTER — Other Ambulatory Visit: Payer: Self-pay | Admitting: Anatomic Pathology & Clinical Pathology

## 2020-10-30 ENCOUNTER — Other Ambulatory Visit: Payer: Self-pay | Admitting: Internal Medicine

## 2020-10-30 ENCOUNTER — Other Ambulatory Visit: Payer: Self-pay

## 2020-10-30 DIAGNOSIS — F17210 Nicotine dependence, cigarettes, uncomplicated: Secondary | ICD-10-CM | POA: Diagnosis not present

## 2020-10-30 DIAGNOSIS — I10 Essential (primary) hypertension: Secondary | ICD-10-CM

## 2020-10-30 DIAGNOSIS — C73 Malignant neoplasm of thyroid gland: Secondary | ICD-10-CM | POA: Diagnosis not present

## 2020-10-30 DIAGNOSIS — E890 Postprocedural hypothyroidism: Secondary | ICD-10-CM | POA: Diagnosis not present

## 2020-10-30 MED ORDER — LISINOPRIL 10 MG PO TABS: 10.0000 mg | ORAL_TABLET | Freq: Two times a day (BID) | ORAL | 0 refills | Status: DC

## 2020-11-05 ENCOUNTER — Other Ambulatory Visit: Payer: Self-pay | Admitting: Internal Medicine

## 2020-11-05 DIAGNOSIS — C73 Malignant neoplasm of thyroid gland: Secondary | ICD-10-CM

## 2020-11-09 ENCOUNTER — Other Ambulatory Visit: Payer: Self-pay | Admitting: Internal Medicine

## 2020-11-09 DIAGNOSIS — C73 Malignant neoplasm of thyroid gland: Secondary | ICD-10-CM

## 2020-11-20 ENCOUNTER — Other Ambulatory Visit: Payer: Self-pay

## 2020-11-20 ENCOUNTER — Encounter
Admission: RE | Admit: 2020-11-20 | Discharge: 2020-11-20 | Disposition: A | Discharge status: Home / Self Care | Payer: Medicare PPO | Source: Ambulatory Visit | Attending: Internal Medicine | Admitting: Internal Medicine

## 2020-11-20 DIAGNOSIS — C73 Malignant neoplasm of thyroid gland: Secondary | ICD-10-CM | POA: Diagnosis not present

## 2020-11-20 MED ORDER — SODIUM IODIDE I 131 CAPSULE
41.3800 | Freq: Once | INTRAVENOUS | Status: AC | PRN
  Administered 2020-11-20: 41.38 via ORAL

## 2020-11-27 ENCOUNTER — Encounter
Admission: RE | Admit: 2020-11-27 | Discharge: 2020-11-27 | Disposition: A | Discharge status: Home / Self Care | Payer: Medicare PPO | Source: Ambulatory Visit | Attending: Internal Medicine | Admitting: Internal Medicine

## 2020-11-27 ENCOUNTER — Other Ambulatory Visit: Payer: Self-pay

## 2020-11-27 DIAGNOSIS — C73 Malignant neoplasm of thyroid gland: Secondary | ICD-10-CM | POA: Diagnosis not present

## 2020-11-27 DIAGNOSIS — E890 Postprocedural hypothyroidism: Secondary | ICD-10-CM | POA: Diagnosis not present

## 2020-12-01 ENCOUNTER — Ambulatory Visit: Payer: Medicare PPO | Admitting: Internal Medicine

## 2020-12-02 ENCOUNTER — Ambulatory Visit: Payer: Medicare PPO

## 2020-12-14 DIAGNOSIS — C73 Malignant neoplasm of thyroid gland: Secondary | ICD-10-CM | POA: Diagnosis not present

## 2020-12-16 DIAGNOSIS — J301 Allergic rhinitis due to pollen: Secondary | ICD-10-CM | POA: Diagnosis not present

## 2020-12-16 DIAGNOSIS — E871 Hypo-osmolality and hyponatremia: Secondary | ICD-10-CM | POA: Diagnosis not present

## 2020-12-17 LAB — CBC WITH DIFFERENTIAL/PLATELET
Basophils Absolute: 0.1 10*3/uL (ref 0.0–0.2)
Basos: 1 %
EOS (ABSOLUTE): 0.2 10*3/uL (ref 0.0–0.4)
Eos: 2 %
Hematocrit: 42.5 % (ref 34.0–46.6)
Hemoglobin: 14.6 g/dL (ref 11.1–15.9)
Immature Grans (Abs): 0 10*3/uL (ref 0.0–0.1)
Immature Granulocytes: 0 %
Lymphocytes Absolute: 2.3 10*3/uL (ref 0.7–3.1)
Lymphs: 33 %
MCH: 31.3 pg (ref 26.6–33.0)
MCHC: 34.4 g/dL (ref 31.5–35.7)
MCV: 91 fL (ref 79–97)
Monocytes Absolute: 0.6 10*3/uL (ref 0.1–0.9)
Monocytes: 9 %
Neutrophils Absolute: 3.8 10*3/uL (ref 1.4–7.0)
Neutrophils: 55 %
Platelets: 317 10*3/uL (ref 150–450)
RBC: 4.67 x10E6/uL (ref 3.77–5.28)
RDW: 13.5 % (ref 11.7–15.4)
WBC: 7 10*3/uL (ref 3.4–10.8)

## 2020-12-17 LAB — COMPREHENSIVE METABOLIC PANEL
ALT: 10 IU/L (ref 0–32)
AST: 15 IU/L (ref 0–40)
Albumin/Globulin Ratio: 2.5 — ABNORMAL HIGH (ref 1.2–2.2)
Albumin: 4.7 g/dL (ref 3.7–4.7)
Alkaline Phosphatase: 68 IU/L (ref 44–121)
BUN/Creatinine Ratio: 10 — ABNORMAL LOW (ref 12–28)
BUN: 7 mg/dL — ABNORMAL LOW (ref 8–27)
Bilirubin Total: 0.5 mg/dL (ref 0.0–1.2)
CO2: 24 mmol/L (ref 20–29)
Calcium: 9.3 mg/dL (ref 8.7–10.3)
Chloride: 91 mmol/L — ABNORMAL LOW (ref 96–106)
Creatinine, Ser: 0.67 mg/dL (ref 0.57–1.00)
Globulin, Total: 1.9 g/dL (ref 1.5–4.5)
Glucose: 98 mg/dL (ref 65–99)
Potassium: 4.5 mmol/L (ref 3.5–5.2)
Sodium: 133 mmol/L — ABNORMAL LOW (ref 134–144)
Total Protein: 6.6 g/dL (ref 6.0–8.5)
eGFR: 91 mL/min/{1.73_m2} (ref >59)

## 2020-12-31 DIAGNOSIS — E890 Postprocedural hypothyroidism: Secondary | ICD-10-CM | POA: Diagnosis not present

## 2020-12-31 DIAGNOSIS — C73 Malignant neoplasm of thyroid gland: Secondary | ICD-10-CM | POA: Diagnosis not present

## 2021-01-04 ENCOUNTER — Other Ambulatory Visit: Payer: Self-pay

## 2021-01-04 ENCOUNTER — Telehealth: Payer: Self-pay

## 2021-01-04 ENCOUNTER — Ambulatory Visit (INDEPENDENT_AMBULATORY_CARE_PROVIDER_SITE_OTHER): Payer: Medicare PPO | Admitting: Internal Medicine

## 2021-01-04 ENCOUNTER — Encounter: Payer: Self-pay | Admitting: Internal Medicine

## 2021-01-04 DIAGNOSIS — I700 Atherosclerosis of aorta: Secondary | ICD-10-CM

## 2021-01-04 DIAGNOSIS — C73 Malignant neoplasm of thyroid gland: Secondary | ICD-10-CM | POA: Diagnosis not present

## 2021-01-04 DIAGNOSIS — G4733 Obstructive sleep apnea (adult) (pediatric): Secondary | ICD-10-CM

## 2021-01-04 DIAGNOSIS — I10 Essential (primary) hypertension: Secondary | ICD-10-CM | POA: Diagnosis not present

## 2021-01-04 DIAGNOSIS — F17200 Nicotine dependence, unspecified, uncomplicated: Secondary | ICD-10-CM | POA: Diagnosis not present

## 2021-01-04 DIAGNOSIS — F17210 Nicotine dependence, cigarettes, uncomplicated: Secondary | ICD-10-CM

## 2021-01-04 DIAGNOSIS — E2839 Other primary ovarian failure: Secondary | ICD-10-CM

## 2021-01-04 DIAGNOSIS — R300 Dysuria: Secondary | ICD-10-CM

## 2021-01-04 DIAGNOSIS — Z0001 Encounter for general adult medical examination with abnormal findings: Secondary | ICD-10-CM

## 2021-01-04 DIAGNOSIS — R7309 Other abnormal glucose: Secondary | ICD-10-CM

## 2021-01-04 DIAGNOSIS — E890 Postprocedural hypothyroidism: Secondary | ICD-10-CM | POA: Diagnosis not present

## 2021-01-04 DIAGNOSIS — Z1211 Encounter for screening for malignant neoplasm of colon: Secondary | ICD-10-CM

## 2021-01-04 LAB — POCT GLYCOSYLATED HEMOGLOBIN (HGB A1C): Hemoglobin A1C: 5.7 % — AB (ref 4.0–5.6)

## 2021-01-04 NOTE — Telephone Encounter (Signed)
Scheduled bone density w/ Melissa @ [facility]. Notified patient of 01/21/21 appointment date @ 9:00-Toni

## 2021-01-04 NOTE — Progress Notes (Signed)
[facility] [place],  [address]  Internal MEDICINE  Office Visit Note  Patient Name: Hailey Wong  [id number]  [id number]  Date of Service: 01/04/2021  Chief Complaint  Patient presents with   Medicare Wellness   Sleep Apnea   Hypertension   Quality Metric Gaps    colonoscopy     HPI Pt is here for routine health maintenance examination.  - Patient feels well, recent diagnosis of papillary thyroid cancer status post total thyroidectomy now on Synthroid 125 mcg once a day.  - Overall she feels well denies any chest pain or shortness of breath.  Continues to use her medications for high blood pressure and hyperlipidemia.  -Patient has diagnosis of sleep apnea, does not want to use CPAP machine overnight pulse oximetry was offered to get her qualified for nocturnal use of oxygen but patient declined at this time  Her history is taken from medical records by Dr. Gabriel Carina ( endocrinology) she will continuity of care.  She had undergone a screening CT chest due to long-term tobacco use on 04/03/2020 at [facility] notable for an incidental right-sided 1.8 cm thyroid nodule. This was followed by a thyroid ultrasound on 06/24/2020 at [facility] which showed multiple thyroid nodules including a right-sided dominant 1.6 cm nodule and a second right-sided 1.5 cm nodule. These two nodules were biopsied by FNA at [facility] on 07/09/2020. Pathology report from the biopsy showed that the right-sided dominant 1.6 cm nodule was consistent with atypia of undetermined significance and gene testing was positive for BRAF, V600E and TP53 mutations. The right-sided 1.5 cm nodule was resulted as inconclusive. She then underwent total thyroidectomy on 10/20/2020 at [facility], surgeon Dr Beverly Gust. Pathology showed multifocal papillary thyroid cancer, classic type, with tumor foci in right lobe of 1.9 cm and 0.25 cm and in left lobe of 0.5 cm and 0.1 cm. There was no vascular or  lymphatic invasion. Margins were clear. No lymph nodes were excised. This is consistent with a T1bNx papillary thyroid cancer and per ATA risk classification, of intermediate risk of recurrence.  Labs reviewed with her patient has elevated glucose which will be followed up by hemoglobin A1c Patient continues to be a smoker however declined for follow-up CT chest at this time she wants to think about it for now  Current preventive health maintenance including BMD colonoscopy will be ordered today Current Medication: Outpatient Encounter Medications as of 01/04/2021  Medication Sig   ASPIRIN 81 PO Take by mouth.   fluticasone (FLONASE) 50 MCG/ACT nasal spray Place 1 spray into both nostrils daily as needed for allergies or rhinitis.   hydrochlorothiazide (HYDRODIURIL) 12.5 MG tablet TAKE 1 TABLET BY MOUTH ONCE DAILY   lisinopril (ZESTRIL) 10 MG tablet Take 1 tablet (10 mg total) by mouth 2 (two) times daily.   rosuvastatin (CRESTOR) 5 MG tablet Take 1 tablet (5 mg total) by mouth daily.   [DISCONTINUED] Calcium Carb-Cholecalciferol (CALCIUM 500 + D PO) Take 1 tablet by mouth 2 (two) times daily. (Patient not taking: Reported on 01/04/2021)   No facility-administered encounter medications on file as of 01/04/2021.    Surgical History: Past Surgical History:  Procedure Laterality Date   BROW LIFT Bilateral 12/13/2019   Procedure: BLEPHAROPLASTY UPPER EYELID; W/EXCESS SKIN BLEPHAROPTOSIS REPAIR; RESECT EX;  Surgeon: Karle Starch, MD;  Location: [place];  Service: Ophthalmology;  Laterality: Bilateral;   right ankle surgery  1997   THYROIDECTOMY Bilateral 10/20/2020   Procedure:  THYROIDECTOMY;  Surgeon: Beverly Gust, MD;  Location: [facility];  Service: ENT;  Laterality: Bilateral;   TUBAL LIGATION  1977    Medical History: Past Medical History:  Diagnosis Date   Chronic cough    Hypertension    Mild cardiomegaly    Sinus trouble    Sleep apnea    no CPAP    Family  History: Family History  Problem Relation Age of Onset   Hypertension Mother    Pulmonary fibrosis Father    Breast cancer Sister     Social History: Social History   Socioeconomic History   Marital status: Married    Spouse name: Not on file   Number of children: Not on file   Years of education: Not on file   Highest education level: Not on file  Occupational History   Not on file  Tobacco Use   Smoking status: Every Day    Packs/day: 1.00    Years: 50.00    Pack years: 50.00    Types: Cigarettes   Smokeless tobacco: Never  Vaping Use   Vaping Use: Never used  Substance and Sexual Activity   Alcohol use: Yes    Alcohol/week: 7.0 standard drinks    Types: 7 Glasses of wine per week    Comment: wine daily   Drug use: Never   Sexual activity: Not on file  Other Topics Concern   Not on file  Social History Narrative   Not on file   Social Determinants of Health   Financial Resource Strain: Not on file  Food Insecurity: Not on file  Transportation Needs: Not on file  Physical Activity: Not on file  Stress: Not on file  Social Connections: Not on file      Review of Systems  Constitutional:  Negative for chills, fatigue and unexpected weight change.  HENT:  Negative for congestion, postnasal drip, rhinorrhea, sneezing and sore throat.   Eyes:  Negative for redness.  Respiratory:  Negative for cough, chest tightness and shortness of breath.   Cardiovascular:  Negative for chest pain and palpitations.  Gastrointestinal:  Negative for abdominal pain, constipation, diarrhea, nausea and vomiting.  Genitourinary:  Negative for dysuria and frequency.  Musculoskeletal:  Negative for arthralgias, back pain, joint swelling and neck pain.  Skin:  Negative for rash.  Neurological: Negative.  Negative for tremors and numbness.  Hematological:  Negative for adenopathy. Does not bruise/bleed easily.  Psychiatric/Behavioral:  Negative for behavioral problems (Depression),  sleep disturbance and suicidal ideas. The patient is not nervous/anxious.     Vital Signs: BP 128/90   Pulse 85   Temp 98.3 F (36.8 C)   Resp 16   Ht 5' 1"  (1.549 m)   Wt 151 lb (68.5 kg)   SpO2 99%   BMI 28.53 kg/m    Physical Exam Constitutional:      General: She is not in acute distress.    Appearance: She is well-developed. She is not diaphoretic.  HENT:     Head: Normocephalic and atraumatic.     Right Ear: External ear normal.     Left Ear: External ear normal.     Nose: Nose normal.     Mouth/Throat:     Pharynx: No oropharyngeal exudate.  Eyes:     General: No scleral icterus.       Right eye: No discharge.        Left eye: No discharge.     Conjunctiva/sclera: Conjunctivae normal.  Pupils: Pupils are equal, round, and reactive to light.  Neck:     Thyroid: No thyromegaly.     Vascular: No JVD.     Trachea: No tracheal deviation.  Cardiovascular:     Rate and Rhythm: Normal rate and regular rhythm.     Heart sounds: Normal heart sounds. No murmur heard.   No friction rub. No gallop.  Pulmonary:     Effort: Pulmonary effort is normal. No respiratory distress.     Breath sounds: Normal breath sounds. No stridor. No wheezing or rales.  Chest:     Chest wall: No tenderness.  Abdominal:     General: Bowel sounds are normal. There is no distension.     Palpations: Abdomen is soft. There is no mass.     Tenderness: There is no abdominal tenderness. There is no guarding or rebound.  Musculoskeletal:        General: No tenderness or deformity. Normal range of motion.     Cervical back: Normal range of motion and neck supple.  Lymphadenopathy:     Cervical: No cervical adenopathy.  Skin:    General: Skin is warm and dry.     Coloration: Skin is not pale.     Findings: No erythema or rash.  Neurological:     Mental Status: She is alert.     Cranial Nerves: No cranial nerve deficit.     Motor: No abnormal muscle tone.     Coordination: Coordination  normal.     Deep Tendon Reflexes: Reflexes are normal and symmetric.  Psychiatric:        Behavior: Behavior normal.        Thought Content: Thought content normal.        Judgment: Judgment normal.     LABS: Recent Results (from the past 2160 hour(s))  SARS CORONAVIRUS 2 (TAT 6-24 HRS) Nasopharyngeal Nasopharyngeal Swab     Status: None   Collection Time: 10/16/20 10:00 AM   Specimen: Nasopharyngeal Swab  Result Value Ref Range   SARS Coronavirus 2 NEGATIVE NEGATIVE    Comment: (NOTE) SARS-CoV-2 target nucleic acids are NOT DETECTED.  The SARS-CoV-2 RNA is generally detectable in upper and lower respiratory specimens during the acute phase of infection. Negative results do not preclude SARS-CoV-2 infection, do not rule out co-infections with other pathogens, and should not be used as the sole basis for treatment or other patient management decisions. Negative results must be combined with clinical observations, patient history, and epidemiological information. The expected result is Negative.  Fact Sheet for Patients: SugarRoll.be  Fact Sheet for Healthcare Providers: https://www.woods-mathews.com/  This test is not yet approved or cleared by the [place] FDA and  has been authorized for detection and/or diagnosis of SARS-CoV-2 by FDA under an Emergency Use Authorization (EUA). This EUA will remain  in effect (meaning this test can be used) for the duration of the COVID-19 declaration under Se ction 564(b)(1) of the Act, 21 U.S.C. section 360bbb-3(b)(1), unless the authorization is terminated or revoked sooner.  Performed at [facility], [place] [address] [place], [place] [address]   Comprehensive metabolic panel per protocol     Status: Abnormal   Collection Time: 10/16/20 10:19 AM  Result Value Ref Range   Sodium 129 (L) 135 - 145 mmol/L   Potassium 3.4 (L) 3.5 - 5.1 mmol/L   Chloride 93 (L) 98 - 111 mmol/L   CO2  28 22 - 32 mmol/L   Glucose, Bld 115 (H)  70 - 99 mg/dL    Comment: Glucose reference range applies only to samples taken after fasting for at least 8 hours.   BUN 8 8 - 23 mg/dL   Creatinine, Ser 0.52 0.44 - 1.00 mg/dL   Calcium 9.1 8.9 - 10.3 mg/dL   Total Protein 6.4 (L) 6.5 - 8.1 g/dL   Albumin 3.9 3.5 - 5.0 g/dL   AST 19 15 - 41 U/L   ALT 12 0 - 44 U/L   Alkaline Phosphatase 57 38 - 126 U/L   Total Bilirubin 0.8 0.3 - 1.2 mg/dL   GFR, Estimated >60 >60 mL/min    Comment: (NOTE) Calculated using the CKD-EPI Creatinine Equation (2021)    Anion gap 8 5 - 15    Comment: Performed at [facility], [address] [place], [place] [address]  CBC per protocol     Status: None   Collection Time: 10/16/20 10:19 AM  Result Value Ref Range   WBC 8.8 4.0 - 10.5 K/uL   RBC 4.50 3.87 - 5.11 MIL/uL   Hemoglobin 13.9 12.0 - 15.0 g/dL   HCT 39.7 36.0 - 46.0 %   MCV 88.2 80.0 - 100.0 fL   MCH 30.9 26.0 - 34.0 pg   MCHC 35.0 30.0 - 36.0 g/dL   RDW 13.9 11.5 - 15.5 %   Platelets 257 150 - 400 K/uL   nRBC 0.0 0.0 - 0.2 %    Comment: Performed at [facility], [address] [place], [place] [address]  Surgical pathology     Status: None   Collection Time: 10/20/20  9:04 AM  Result Value Ref Range   SURGICAL PATHOLOGY      SURGICAL PATHOLOGY CASE: [id number] PATIENT: [facility] Surgical Pathology Report     Specimen Submitted: A. Thyroid; total thyroidectomy  Clinical History: Incidental finding of right thyroid nodule with calcification on chest CT in December 2021. Multiple thyroid nodules were seen on ultrasound. FNA of 1.6 cm right mid thyroid nodule is Thyroseq positive for BRAF V600E and TP53 mutations.     DIAGNOSIS: A. THYROID; TOTAL THYROIDECTOMY: - MULTIFOCAL PAPILLARY THYROID CARCINOMA, SEE SUMMARY BELOW. - PARATHYROID TISSUE, 0.4 CM, RIGHT MID THYROID. - NODULAR HYPERPLASIA (MULTINODULAR GOITER).  Comment: The right mid thyroid nodule  is the largest tumor. It is partly calcified. It corresponds to the previously sampled right mid nodule ([id number], collected 07/09/20).  CANCER CASE SUMMARY: THYROID GLAND Standard(s): AJCC-UICC 8  SPECIMEN Procedure: Total thyroidectomy  TUMOR Tumor Focality: Multifocal Histologic type: Papillary carcinoma, classic  type  Tumor Site: Right lobe - mid      Tumor size: 1.9 cm in greatest dimension (nodule B in gross description)  Tumor Site: Left lobe - superior      Tumor size: 0.5 cm in greatest dimension (nodule C in gross description)  Tumor Site: Right lobe - superior (microscopic tumor in grossly uninvolved thyroid)      Tumor size: 0.25 cm in greatest dimension  Tumor Site: Left lobe - unspecified (microscopic tumor in grossly uninvolved thyroid)      Tumor size: 0.1 cm in greatest dimension  Angioinvasion (Vascular Invasion): Not identified in any of the nodules Lymphatic Invasion: Not identified in any of the nodules Extrathyroidal Extension: Not identified in any of the nodules Margin Status: All margins negative for carcinoma  REGIONAL LYMPH NODES Regional Lymph Nodes: Not applicable (no regional lymph nodes submitted or found)  DISTANT METASTASIS Distant Site(s) Involved, if applicable (select all that apply):  Not applicable  PATHOLOGIC STAGE CLASSIFICATIO N (pTNM, AJCC 8th Edition): TNM Descriptors: m (multiple primary tumors pT1b (m) pN not assigned pM - Not applicable   GROSS DESCRIPTION: A. Labeled: Thyroid-suture right upper lobe Received: Formalin Collection time: 9:09 AM on 10/20/2020 Placed into formalin time: 9:15 AM on 10/20/2020 Type of procedure: Thyroidectomy Weight of specimen: 22 grams Size: of specimen: Total: 5.6 (medial to lateral) x 5.2 (superior to inferior) x 2.5 (anterior to posterior) cm; right lobe: 5.2 x 2.7 x 2.3 cm; left lobe: 4.4 x 2.7 x 1.4 cm; isthmus: 2.2 x 1.8 x 0.6 cm Orientation: The specimen is received  oriented with a stitch at the right upper pole.  The specimen is inked as follows: Anterior capsule = blue, posterior capsule = black, and isthmus = orange.  Number of nodules: 4 Location and size of nodules: Nodule A: right superior pole, 1.1 x 0.8 x 0.6 cm Nodule B: right mid pole, 1.9 x 1.3 x 1.2 cm Nodules A and B are 0.4 cm apart. Nodule C: left superior pole, 0.5 x 0.5 x 0.4  cm Nodule D: left inferior pole, 1.2 x 1 x 0.9 cm Nodules C and D are 1.4 cm apart.  Description of nodules: Nodule A has ill-defined borders and is cystic with tan-yellow softened solid areas.  Approximately 60% of the nodule is solid and 40% is cystic. Nodule B is relatively well-circumscribed with scattered areas of calcification.  The nodule is cystic with areas of tan to red firm solid calcification.  Approximately 60% of the nodule is solid and 40% is cystic. At the superior aspect the nodule is less well-circumscribed with a 0.7 x 0.6 x 0.5 cm tan-white ill-defined area.  This area appears grossly as part of nodule B; however, if this is found to be a separate abutting nodule, the size of nodule B would be 1.4 x 1.3 x 1.2 cm. Nodule C is an ill-defined area of tan-white discoloration which is an adjacent to and potentially involving an adjacent unilocular cyst, 1.2 x 0.8 x 0.7 cm. Nodule D is an ill-defined cluster of multiple cysts with surrounding tan disco loration.  Confinement to thyroid: All the nodules appeared grossly confined to the thyroid parenchyma.  Margins: Nodule A: Anterior capsule = less than 0.1 cm, posterior capsule = less than 0.1 cm, isthmus = 2 cm Nodule B: Anterior capsule = less than 0.1 cm, posterior capsule = 0.5 cm, isthmus: 0.8 cm Nodule C: Anterior capsule = 0.1 cm, posterior capsule = less than 0.1 cm, isthmus = 1.5 cm Nodule D: Anterior capsule = 0.1 cm, posterior capsule = 0.2 cm, isthmus = 1.7 cm  Description of remainder of the thyroid: The remainder of  the parenchyma is red-brown with multiple scattered cysts throughout both the right and left lobes, ranging from 0.1 to 0.9 cm in greatest dimension.  The nodules contain tan gelatinous to clear serous contents with iridescent flecks. Parathyroids: No distinct parathyroids are grossly appreciated. Lymph nodes: No distinct lymph node candidates are grossly identified.  Block summary (The nodules are submitted entirely.  Approximately 35 % of the specimen remains.): 1 - 2 - nodule A, submitted entirely and sequentially from superior to inferior with closest approach to anterior and posterior capsule      1 - superior aspect, perpendicularly sectioned 3 - representative grossly normal parenchyma between nodules A and B 4 - 7 - nodule B, submitted entirely and sequentially from superior to inferior      4 -  ill-defined area      5 - 7 - sections decalcified           5 - closest approach to anterior capsule           7 - closest approach to posterior capsule 8 - cyst adjacent to nodule C 9 - nodule C, submitted entirely with closest approach to anterior and posterior capsules 10 - representative grossly normal parenchyma between nodules C and D 11 - 12 - nodule D, submitted entirely and sequentially from superior to inferior with closest approach to anterior and posterior capsule      12 - inferior most aspect, perpendicularly sectioned 13 - 15 - representative remaining right lobe parenchyma with cyst s      13 - superior pole      14 - midpole      15 - inferior pole 16 - 18 - representative remaining left lobe parenchyma with cysts      16 - superior pole      17 - midpole      18 - inferior pole 19 - representative isthmus  RB 10/21/2020  Final Diagnosis performed by Bryan Lemma, MD.   Electronically signed 10/22/2020 3:00:45PM The electronic signature indicates that the named Attending Pathologist has evaluated the specimen Technical component performed at [facility], [address] [place], [place] [address] Lab: [phone] Dir: Rush Farmer, MD, MMM  Professional component performed at [facility], [facility], [place], [place], [place] [address] Lab: [phone] Dir: Dellia Nims. Rubinas, MD   Calcium     Status: Abnormal   Collection Time: 10/20/20  4:34 PM  Result Value Ref Range   Calcium 8.5 (L) 8.9 - 10.3 mg/dL    Comment: Performed at [facility], [address] [place], [place] [address]  Calcium     Status: None   Collection Time: 10/21/20  4:53 AM  Result Value Ref Range   Calcium 9.0 8.9 - 10.3 mg/dL    Comment: Performed at [facility], [address] [place], [place] [address]  CBC w/Diff/Platelet     Status: None   Collection Time: 12/16/20 11:12 AM  Result Value Ref Range   WBC 7.0 3.4 - 10.8 x10E3/uL   RBC 4.67 3.77 - 5.28 x10E6/uL   Hemoglobin 14.6 11.1 - 15.9 g/dL   Hematocrit 42.5 34.0 - 46.6 %   MCV 91 79 - 97 fL   MCH 31.3 26.6 - 33.0 pg   MCHC 34.4 31.5 - 35.7 g/dL   RDW 13.5 11.7 - 15.4 %   Platelets 317 150 - 450 x10E3/uL   Neutrophils 55 Not Estab. %   Lymphs 33 Not Estab. %   Monocytes 9 Not Estab. %   Eos 2 Not Estab. %   Basos 1 Not Estab. %   Neutrophils Absolute 3.8 1.4 - 7.0 x10E3/uL   Lymphocytes Absolute 2.3 0.7 - 3.1 x10E3/uL   Monocytes Absolute 0.6 0.1 - 0.9 x10E3/uL   EOS (ABSOLUTE) 0.2 0.0 - 0.4 x10E3/uL   Basophils Absolute 0.1 0.0 - 0.2 x10E3/uL   Immature Granulocytes 0 Not Estab. %   Immature Grans (Abs) 0.0 0.0 - 0.1 x10E3/uL  Comprehensive Metabolic Panel (CMET)     Status: Abnormal   Collection Time: 12/16/20 11:12 AM  Result Value Ref Range   Glucose 98 65 - 99 mg/dL   BUN 7 (L) 8 - 27 mg/dL   Creatinine, Ser 0.67 0.57 - 1.00 mg/dL   eGFR  91 >59 mL/min/1.73   BUN/Creatinine Ratio 10 (L) 12 - 28   Sodium 133 (L) 134 - 144 mmol/L   Potassium 4.5 3.5 - 5.2 mmol/L   Chloride 91 (L) 96 - 106 mmol/L   CO2 24 20 - 29 mmol/L   Calcium 9.3 8.7 - 10.3 mg/dL    Total Protein 6.6 6.0 - 8.5 g/dL   Albumin 4.7 3.7 - 4.7 g/dL   Globulin, Total 1.9 1.5 - 4.5 g/dL   Albumin/Globulin Ratio 2.5 (H) 1.2 - 2.2   Bilirubin Total 0.5 0.0 - 1.2 mg/dL   Alkaline Phosphatase 68 44 - 121 IU/L   AST 15 0 - 40 IU/L   ALT 10 0 - 32 IU/L  POCT glycosylated hemoglobin (Hb A1C)     Status: Abnormal   Collection Time: 01/04/21 11:53 AM  Result Value Ref Range   Hemoglobin A1C 5.7 (A) 4.0 - 5.6 %   HbA1c POC (<> result, manual entry)     HbA1c, POC (prediabetic range)     HbA1c, POC (controlled diabetic range)        Assessment/Plan: 1. Encounter for general adult medical examination with abnormal findings We will update all preventive health maintenance including BMD, mammogram, colonoscopy  2. Benign hypertension Blood pressure is well controlled continue therapy as before  3. Aortic atherosclerosis (HCC) Continue Crestor as before  4. Cigarette nicotine dependence without complication Patient is not interested in smoking cessation  5. Obstructive sleep apnea Patient wants to hold off on overnight pulse oximetry as she does not want to use a mask for her CPAP machine  6. Other primary ovarian failure Bone density is ordered - DG Bone Density; Future  7. Screening for colon cancer GI for colonoscopy - Ambulatory referral to Gastroenterology  8. Abnormal glucose Hemoglobin A1c is below 6 she was instructed to watch her diet - POCT glycosylated hemoglobin (Hb A1C)  9. Thyroid cancer ([place]) Recent diagnosis of multifocal papillary thyroid cancer status post total thyroidectomy patient is on replacement with Synthroid 125 mcg once a day she has been following up with endocrinology at this time  10. Dysuria - UA/M w/rflx Culture, Routine   General Counseling: Tanishi verbalizes understanding of the findings of todays visit and agrees with plan of treatment. I have discussed any further diagnostic evaluation that may be needed or ordered today. We  also reviewed her medications today. she has been encouraged to call the office with any questions or concerns that should arise related to todays visit.    Counseling:  [place] Controlled Substance Database was reviewed by me.  Orders Placed This Encounter  Procedures   DG Bone Density   UA/M w/rflx Culture, Routine   Ambulatory referral to Gastroenterology   POCT glycosylated hemoglobin (Hb A1C)    No orders of the defined types were placed in this encounter.   Total time spent:35 Minutes  Time spent includes review of chart, medications, test results, and follow up plan with the patient.     Lavera Guise, MD  Internal Medicine

## 2021-01-05 ENCOUNTER — Ambulatory Visit: Payer: Medicare PPO | Admitting: Internal Medicine

## 2021-01-05 LAB — UA/M W/RFLX CULTURE, ROUTINE
Bilirubin, UA: NEGATIVE
Glucose, UA: NEGATIVE
Leukocytes,UA: NEGATIVE
Nitrite, UA: NEGATIVE
Protein,UA: NEGATIVE
RBC, UA: NEGATIVE
Specific Gravity, UA: 1.011 (ref 1.005–1.030)
Urobilinogen, Ur: 0.2 mg/dL (ref 0.2–1.0)
pH, UA: 5.5 (ref 5.0–7.5)

## 2021-01-05 LAB — MICROSCOPIC EXAMINATION
Bacteria, UA: NONE SEEN
Casts: NONE SEEN /lpf
Epithelial Cells (non renal): NONE SEEN /hpf (ref 0–10)
RBC, Urine: NONE SEEN /hpf (ref 0–2)
WBC, UA: NONE SEEN /hpf (ref 0–5)

## 2021-01-06 ENCOUNTER — Telehealth: Payer: Self-pay

## 2021-01-06 ENCOUNTER — Other Ambulatory Visit: Payer: Self-pay

## 2021-01-06 DIAGNOSIS — Z1211 Encounter for screening for malignant neoplasm of colon: Secondary | ICD-10-CM

## 2021-01-06 MED ORDER — PEG 3350-KCL-NA BICARB-NACL 420 G PO SOLR: 4000.0000 mL | Freq: Once | ORAL | 0 refills | Status: AC

## 2021-01-06 NOTE — Progress Notes (Signed)
Gastroenterology Pre-Procedure Review  Request Date: 03/02/21 Requesting Physician: Dr. Vicente Males  PATIENT REVIEW QUESTIONS: The patient responded to the following health history questions as indicated:    1. Are you having any GI issues? no 2. Do you have a personal history of Polyps?  Unsure, last colonoscopy was 10 years ago. 3. Do you have a family history of Colon Cancer or Polyps? no 4. Diabetes Mellitus? no 5. Joint replacements in the past 12 months?no 6. Major health problems in the past 3 months?no 7. Any artificial heart valves, MVP, or defibrillator?no    MEDICATIONS & ALLERGIES:    Patient reports the following regarding taking any anticoagulation/antiplatelet therapy:   Plavix, Coumadin, Eliquis, Xarelto, Lovenox, Pradaxa, Brilinta, or Effient? no Aspirin? yes (81 mg)  Patient confirms/reports the following medications:  Current Outpatient Medications  Medication Sig Dispense Refill   ASPIRIN 81 PO Take by mouth.     fluticasone (FLONASE) 50 MCG/ACT nasal spray Place 1 spray into both nostrils daily as needed for allergies or rhinitis.     hydrochlorothiazide (HYDRODIURIL) 12.5 MG tablet TAKE 1 TABLET BY MOUTH ONCE DAILY 90 tablet 1   lisinopril (ZESTRIL) 10 MG tablet Take 1 tablet (10 mg total) by mouth 2 (two) times daily. 180 tablet 0   rosuvastatin (CRESTOR) 5 MG tablet Take 1 tablet (5 mg total) by mouth daily. 90 tablet 3   No current facility-administered medications for this visit.    Patient confirms/reports the following allergies:  No Known Allergies  No orders of the defined types were placed in this encounter.   AUTHORIZATION INFORMATION Primary Insurance: 1D#: Group #:  Secondary Insurance: 1D#: Group #:  SCHEDULE INFORMATION: Date: 03/02/21  Time: Location: [place]

## 2021-01-06 NOTE — Telephone Encounter (Signed)
Procedure scheduled for 03/02/21.

## 2021-01-06 NOTE — Telephone Encounter (Signed)
Pt. Returning call to schedule procedure

## 2021-01-21 ENCOUNTER — Other Ambulatory Visit: Payer: Self-pay

## 2021-01-21 ENCOUNTER — Ambulatory Visit
Admission: RE | Admit: 2021-01-21 | Discharge: 2021-01-21 | Disposition: A | Discharge status: Home / Self Care | Payer: Medicare PPO | Source: Ambulatory Visit | Attending: Nurse Practitioner | Admitting: Nurse Practitioner

## 2021-01-21 ENCOUNTER — Other Ambulatory Visit: Payer: Self-pay | Admitting: *Deleted

## 2021-01-21 ENCOUNTER — Other Ambulatory Visit: Payer: Self-pay | Admitting: Nurse Practitioner

## 2021-01-21 ENCOUNTER — Inpatient Hospital Stay
Admission: RE | Admit: 2021-01-21 | Discharge: 2021-01-21 | Disposition: A | Discharge status: Home / Self Care | Payer: Self-pay | Source: Ambulatory Visit | Attending: *Deleted | Admitting: *Deleted

## 2021-01-21 ENCOUNTER — Ambulatory Visit
Admission: RE | Admit: 2021-01-21 | Discharge: 2021-01-21 | Disposition: A | Discharge status: Home / Self Care | Payer: Medicare PPO | Source: Ambulatory Visit | Attending: Internal Medicine | Admitting: Internal Medicine

## 2021-01-21 DIAGNOSIS — M85831 Other specified disorders of bone density and structure, right forearm: Secondary | ICD-10-CM | POA: Diagnosis not present

## 2021-01-21 DIAGNOSIS — Z1231 Encounter for screening mammogram for malignant neoplasm of breast: Secondary | ICD-10-CM | POA: Diagnosis not present

## 2021-01-21 DIAGNOSIS — M85851 Other specified disorders of bone density and structure, right thigh: Secondary | ICD-10-CM | POA: Diagnosis not present

## 2021-01-21 DIAGNOSIS — E2839 Other primary ovarian failure: Secondary | ICD-10-CM | POA: Diagnosis not present

## 2021-01-25 ENCOUNTER — Telehealth: Payer: Self-pay

## 2021-01-25 NOTE — Telephone Encounter (Signed)
-----   Message from Lavera Guise, MD sent at 01/25/2021 11:14 AM EDT ----- Pt has osteoporosis, and will like her to start Boniva once a month, she needs to take calcium and Vit d OTC, please let me

## 2021-01-25 NOTE — Telephone Encounter (Signed)
Spoke to pt and she wants to wait on taking the [place] for now and she will let us know.  She wants to know more on her numbers.  Pt will start taking calcium 500 mg every other day and do the Vit D3 OTC for now.  Pt will wait to discuss the results further at Jan appt if not she will schedule an earlier appt.

## 2021-01-27 DIAGNOSIS — L723 Sebaceous cyst: Secondary | ICD-10-CM | POA: Diagnosis not present

## 2021-01-27 DIAGNOSIS — C73 Malignant neoplasm of thyroid gland: Secondary | ICD-10-CM | POA: Diagnosis not present

## 2021-02-08 ENCOUNTER — Other Ambulatory Visit: Payer: Self-pay | Admitting: Nurse Practitioner

## 2021-02-08 DIAGNOSIS — I10 Essential (primary) hypertension: Secondary | ICD-10-CM

## 2021-03-02 ENCOUNTER — Ambulatory Visit: Payer: Medicare PPO | Admitting: Certified Registered Nurse Anesthetist

## 2021-03-02 ENCOUNTER — Encounter: Payer: Self-pay | Admitting: Gastroenterology

## 2021-03-02 ENCOUNTER — Ambulatory Visit
Admission: RE | Admit: 2021-03-02 | Discharge: 2021-03-02 | Disposition: A | Discharge status: Home / Self Care | Payer: Medicare PPO | Source: Ambulatory Visit | Attending: Gastroenterology | Admitting: Gastroenterology

## 2021-03-02 ENCOUNTER — Encounter
Admission: RE | Disposition: A | Discharge status: Home / Self Care | Payer: Self-pay | Source: Ambulatory Visit | Attending: Gastroenterology

## 2021-03-02 DIAGNOSIS — F17210 Nicotine dependence, cigarettes, uncomplicated: Secondary | ICD-10-CM | POA: Insufficient documentation

## 2021-03-02 DIAGNOSIS — D124 Benign neoplasm of descending colon: Secondary | ICD-10-CM | POA: Insufficient documentation

## 2021-03-02 DIAGNOSIS — D122 Benign neoplasm of ascending colon: Secondary | ICD-10-CM | POA: Insufficient documentation

## 2021-03-02 DIAGNOSIS — Z1211 Encounter for screening for malignant neoplasm of colon: Secondary | ICD-10-CM | POA: Insufficient documentation

## 2021-03-02 DIAGNOSIS — K635 Polyp of colon: Secondary | ICD-10-CM | POA: Diagnosis not present

## 2021-03-02 DIAGNOSIS — K5730 Diverticulosis of large intestine without perforation or abscess without bleeding: Secondary | ICD-10-CM | POA: Diagnosis not present

## 2021-03-02 DIAGNOSIS — D126 Benign neoplasm of colon, unspecified: Secondary | ICD-10-CM | POA: Diagnosis not present

## 2021-03-02 HISTORY — PX: COLONOSCOPY WITH PROPOFOL: SHX5780

## 2021-03-02 HISTORY — DX: Hypothyroidism, unspecified: E03.9

## 2021-03-02 SURGERY — COLONOSCOPY WITH PROPOFOL
Anesthesia: General

## 2021-03-02 MED ORDER — PROPOFOL 500 MG/50ML IV EMUL
INTRAVENOUS | Status: DC | PRN
  Administered 2021-03-02: 140 ug/kg/min via INTRAVENOUS

## 2021-03-02 MED ORDER — PROPOFOL 500 MG/50ML IV EMUL
INTRAVENOUS | Status: AC
  Filled 2021-03-02: qty 50

## 2021-03-02 MED ORDER — PROPOFOL 10 MG/ML IV BOLUS
INTRAVENOUS | Status: DC | PRN
  Administered 2021-03-02: 10 mg via INTRAVENOUS
  Administered 2021-03-02: 60 mg via INTRAVENOUS

## 2021-03-02 MED ORDER — SODIUM CHLORIDE 0.9 % IV SOLN: INTRAVENOUS | Status: DC

## 2021-03-02 MED ORDER — PROPOFOL 500 MG/50ML IV EMUL
INTRAVENOUS | Status: AC
  Filled 2021-03-02: qty 100

## 2021-03-02 MED ORDER — LIDOCAINE HCL (CARDIAC) PF 100 MG/5ML IV SOSY
PREFILLED_SYRINGE | INTRAVENOUS | Status: DC | PRN
  Administered 2021-03-02: 100 mg via INTRAVENOUS

## 2021-03-02 MED ORDER — PHENYLEPHRINE HCL (PRESSORS) 10 MG/ML IV SOLN
INTRAVENOUS | Status: AC
  Filled 2021-03-02: qty 1

## 2021-03-02 NOTE — H&P (Signed)
Jonathon Bellows, MD [address] [place], [place], [place], [address] [id number], [place], [place], [place], [address] Phone: [phone]  Fax: [phone]  Primary Care Physician:  Lavera Guise, MD   Pre-Procedure History & Physical: HPI:  Hailey Wong is a 75 y.o. female is here for an colonoscopy.   Past Medical History:  Diagnosis Date   Chronic cough    Hypertension    Hypothyroidism    Mild cardiomegaly    Sinus trouble    Sleep apnea    no CPAP    Past Surgical History:  Procedure Laterality Date   BROW LIFT Bilateral 12/13/2019   Procedure: BLEPHAROPLASTY UPPER EYELID; W/EXCESS SKIN BLEPHAROPTOSIS REPAIR; RESECT EX;  Surgeon: Karle Starch, MD;  Location: [facility];  Service: Ophthalmology;  Laterality: Bilateral;   right ankle surgery  1997   THYROIDECTOMY Bilateral 10/20/2020   Procedure: THYROIDECTOMY;  Surgeon: Beverly Gust, MD;  Location: [facility];  Service: ENT;  Laterality: Bilateral;   [place]    Prior to Admission medications   Medication Sig Start Date End Date Taking? Authorizing Provider  hydrochlorothiazide (HYDRODIURIL) 12.5 MG tablet TAKE 1 TABLET BY MOUTH ONCE DAILY 10/30/20  Yes Lavera Guise, MD  levothyroxine (SYNTHROID) 137 MCG tablet Take 137 mcg by mouth daily before breakfast.   Yes [provider]  lisinopril (ZESTRIL) 10 MG tablet TAKE 1 TABLET BY MOUTH TWICE DAILY 02/08/21  Yes Abernathy, Alyssa, NP  rosuvastatin (CRESTOR) 5 MG tablet Take 1 tablet (5 mg total) by mouth daily. 05/14/20  Yes Luiz Ochoa, NP  ASPIRIN 81 PO Take by mouth.    [provider]  fluticasone (FLONASE) 50 MCG/ACT nasal spray Place 1 spray into both nostrils daily as needed for allergies or rhinitis.    [provider]    Allergies as of 01/06/2021   (No Known Allergies)    Family History  Problem Relation Age of Onset   Hypertension Mother    Pulmonary fibrosis Father    Breast cancer Sister      Social History   Socioeconomic History   Marital status: Married    Spouse name: Not on file   Number of children: Not on file   Years of education: Not on file   Highest education level: Not on file  Occupational History   Not on file  Tobacco Use   Smoking status: Every Day    Packs/day: 1.00    Years: 50.00    Pack years: 50.00    Types: Cigarettes   Smokeless tobacco: Never  Vaping Use   Vaping Use: Never used  Substance and Sexual Activity   Alcohol use: Yes    Alcohol/week: 7.0 standard drinks    Types: 7 Glasses of wine per week    Comment: wine daily   Drug use: Never   Sexual activity: Not on file  Other Topics Concern   Not on file  Social History Narrative   Not on file   Social Determinants of Health   Financial Resource Strain: Not on file  Food Insecurity: Not on file  Transportation Needs: Not on file  Physical Activity: Not on file  Stress: Not on file  Social Connections: Not on file  Intimate Partner Violence: Not on file    Review of Systems: See HPI, otherwise negative ROS  Physical Exam: BP (!) 144/90   Pulse 65   Temp (!) 96.5 F (35.8 C) (Temporal)  Resp 16   Ht 5\' 1"  (1.549 m)   Wt 68 kg   SpO2 100%   BMI 28.34 kg/m  General:   Alert,  pleasant and cooperative in NAD Head:  Normocephalic and atraumatic. Neck:  Supple; no masses or thyromegaly. Lungs:  Clear throughout to auscultation, normal respiratory effort.    Heart:  +S1, +S2, Regular rate and rhythm, No edema. Abdomen:  Soft, nontender and nondistended. Normal bowel sounds, without guarding, and without rebound.   Neurologic:  Alert and  oriented x4;  grossly normal neurologically.  Impression/Plan: Hailey Wong is here for an colonoscopy to be performed for Screening colonoscopy average risk   Risks, benefits, limitations, and alternatives regarding  colonoscopy have been reviewed with the patient.  Questions have been answered.  All parties agreeable.   Jonathon Bellows, MD  03/02/2021, 7:45 AM

## 2021-03-02 NOTE — Transfer of Care (Signed)
Immediate Anesthesia Transfer of Care Note  Patient: Hailey Wong  Procedure(s) Performed: COLONOSCOPY WITH PROPOFOL  Patient Location: Endoscopy Unit  Anesthesia Type:General  Level of Consciousness: awake, alert  and oriented  Airway & Oxygen Therapy: Patient Spontanous Breathing  Post-op Assessment: Report given to RN and Post -op Vital signs reviewed and stable  Post vital signs: Reviewed and stable  Last Vitals:  Vitals Value Taken Time  BP 125/74   Temp    Pulse 73 03/02/21 0808  Resp 12 03/02/21 0808  SpO2 100 % 03/02/21 0808  Vitals shown include unvalidated device data.  Last Pain:  Vitals:   03/02/21 0704  TempSrc: Temporal  PainSc: 0-No pain         Complications: No notable events documented.

## 2021-03-02 NOTE — Anesthesia Postprocedure Evaluation (Signed)
Anesthesia Post Note  Patient: Hailey Wong  Procedure(s) Performed: COLONOSCOPY WITH PROPOFOL  Patient location during evaluation: Endoscopy Anesthesia Type: General Level of consciousness: awake and alert Pain management: pain level controlled Vital Signs Assessment: post-procedure vital signs reviewed and stable Respiratory status: spontaneous breathing, nonlabored ventilation, respiratory function stable and patient connected to nasal cannula oxygen Cardiovascular status: blood pressure returned to baseline and stable Postop Assessment: no apparent nausea or vomiting Anesthetic complications: no   No notable events documented.   Last Vitals:  Vitals:   03/02/21 0818 03/02/21 0828  BP: (!) 141/95 (!) 161/79  Pulse:    Resp:    Temp:    SpO2:      Last Pain:  Vitals:   03/02/21 0828  TempSrc:   PainSc: 0-No pain                 Arita Miss

## 2021-03-02 NOTE — Op Note (Signed)
[facility] Gastroenterology Patient Name: Hailey Wong Procedure Date: 03/02/2021 7:10 AM MRN: [id number] Account #: [id number] Date of Birth: 1946/04/04 Admit Type: Outpatient Age: 75 Room: [facility] ENDO ROOM 2 Gender: Female Note Status: Finalized Instrument Name: Jasper Riling [id number] Procedure:             Colonoscopy Indications:           Screening for colorectal malignant neoplasm Providers:             Jonathon Bellows MD, MD Referring MD:          Lavera Guise, MD (Referring MD) Medicines:             Monitored Anesthesia Care Complications:         No immediate complications. Procedure:             Pre-Anesthesia Assessment:                        - Prior to the procedure, a History and Physical was                         performed, and patient medications, allergies and                         sensitivities were reviewed. The patient's tolerance                         of previous anesthesia was reviewed.                        - The risks and benefits of the procedure and the                         sedation options and risks were discussed with the                         patient. All questions were answered and informed                         consent was obtained.                        - ASA Grade Assessment: II - A patient with mild                         systemic disease.                        After obtaining informed consent, the colonoscope was                         passed under direct vision. Throughout the procedure,                         the patient's blood pressure, pulse, and oxygen                         saturations were monitored continuously. The                         Colonoscope was  introduced through the anus and                         advanced to the the cecum, identified by the                         appendiceal orifice. The colonoscopy was performed                         with ease. The patient tolerated the procedure well.                          The quality of the bowel preparation was adequate. Findings:      The perianal and digital rectal examinations were normal.      Three sessile polyps were found in the ascending colon. The polyps were       5 to 7 mm in size. These polyps were removed with a cold snare.       Resection and retrieval were complete.      A 5 mm polyp was found in the descending colon. The polyp was sessile.       The polyp was removed with a cold snare. Resection and retrieval were       complete.      Multiple small-mouthed diverticula were found in the sigmoid colon.      The exam was otherwise without abnormality on direct and retroflexion       views. Impression:            - Three 5 to 7 mm polyps in the ascending colon,                         removed with a cold snare. Resected and retrieved.                        - One 5 mm polyp in the descending colon, removed with                         a cold snare. Resected and retrieved.                        - Diverticulosis in the sigmoid colon.                        - The examination was otherwise normal on direct and                         retroflexion views. Recommendation:        - Discharge patient to home (with escort).                        - Resume previous diet.                        - Continue present medications.                        - Repeat colonoscopy is not recommended due to current  age (69 years or older). Procedure Code(s):     --- Professional ---                        [phone], Colonoscopy, flexible; with removal of                         tumor(s), polyp(s), or other lesion(s) by snare                         technique Diagnosis Code(s):     --- Professional ---                        Z12.11, Encounter for screening for malignant neoplasm                         of colon                        K63.5, Polyp of colon                        K57.30, Diverticulosis of large intestine  without                         perforation or abscess without bleeding CPT copyright 2019 American Medical Association. All rights reserved. The codes documented in this report are preliminary and upon coder review may  be revised to meet current compliance requirements. Jonathon Bellows, MD Jonathon Bellows MD, MD 03/02/2021 8:07:55 AM This report has been signed electronically. Number of Addenda: 0 Note Initiated On: 03/02/2021 7:10 AM Scope Withdrawal Time: 0 hours 13 minutes 23 seconds  Total Procedure Duration: 0 hours 16 minutes 12 seconds  Estimated Blood Loss:  Estimated blood loss: none.      [facility]

## 2021-03-02 NOTE — Anesthesia Procedure Notes (Signed)
Date/Time: 03/02/2021 7:45 AM Performed by: Lily Peer, Bryssa Tones, CRNA Pre-anesthesia Checklist: Patient identified, Emergency Drugs available, Suction available, Patient being monitored and Timeout performed Patient Re-evaluated:Patient Re-evaluated prior to induction Oxygen Delivery Method: Nasal cannula Induction Type: IV induction

## 2021-03-02 NOTE — Anesthesia Preprocedure Evaluation (Signed)
Anesthesia Evaluation  Patient identified by MRN, date of birth, ID band Patient awake    Reviewed: Allergy & Precautions, NPO status , Patient's Chart, lab work & pertinent test results  History of Anesthesia Complications Negative for: history of anesthetic complications  Airway Mallampati: II  TM Distance: >3 FB Neck ROM: Full    Dental no notable dental hx. (+) Teeth Intact   Pulmonary sleep apnea , neg COPD, Current SmokerPatient did not abstain from smoking.,    Pulmonary exam normal breath sounds clear to auscultation       Cardiovascular Exercise Tolerance: Good METShypertension, (-) CAD and (-) Past MI (-) dysrhythmias  Rhythm:Regular Rate:Normal - Systolic murmurs    Neuro/Psych negative neurological ROS  negative psych ROS   GI/Hepatic neg GERD  ,(+)     (-) substance abuse  ,   Endo/Other  neg diabetesHypothyroidism   Renal/GU negative Renal ROS     Musculoskeletal   Abdominal   Peds  Hematology   Anesthesia Other Findings Past Medical History: No date: Chronic cough No date: Hypertension No date: Hypothyroidism No date: Mild cardiomegaly No date: Sinus trouble No date: Sleep apnea     Comment:  no CPAP  Reproductive/Obstetrics                             Anesthesia Physical Anesthesia Plan  ASA: 2  Anesthesia Plan: General   Post-op Pain Management:    Induction: Intravenous  PONV Risk Score and Plan: 2 and Ondansetron, Propofol infusion and TIVA  Airway Management Planned: Nasal Cannula  Additional Equipment: None  Intra-op Plan:   Post-operative Plan:   Informed Consent: I have reviewed the patients History and Physical, chart, labs and discussed the procedure including the risks, benefits and alternatives for the proposed anesthesia with the patient or authorized representative who has indicated his/her understanding and acceptance.     Dental  advisory given  Plan Discussed with: CRNA and Surgeon  Anesthesia Plan Comments: (Discussed risks of anesthesia with patient, including possibility of difficulty with spontaneous ventilation under anesthesia necessitating airway intervention, PONV, and rare risks such as cardiac or respiratory or neurological events, and allergic reactions. Discussed the role of CRNA in patient's perioperative care. Patient understands. Patient counseled on benefits of smoking cessation, and increased perioperative risks associated with continued smoking. )        Anesthesia Quick Evaluation

## 2021-03-03 ENCOUNTER — Encounter: Payer: Self-pay | Admitting: Gastroenterology

## 2021-03-03 LAB — SURGICAL PATHOLOGY

## 2021-03-04 ENCOUNTER — Encounter: Payer: Self-pay | Admitting: Gastroenterology

## 2021-04-02 DIAGNOSIS — C73 Malignant neoplasm of thyroid gland: Secondary | ICD-10-CM | POA: Diagnosis not present

## 2021-04-09 DIAGNOSIS — F17210 Nicotine dependence, cigarettes, uncomplicated: Secondary | ICD-10-CM | POA: Diagnosis not present

## 2021-04-09 DIAGNOSIS — Z85850 Personal history of malignant neoplasm of thyroid: Secondary | ICD-10-CM | POA: Diagnosis not present

## 2021-04-09 DIAGNOSIS — E890 Postprocedural hypothyroidism: Secondary | ICD-10-CM | POA: Diagnosis not present

## 2021-04-28 DIAGNOSIS — L723 Sebaceous cyst: Secondary | ICD-10-CM | POA: Diagnosis not present

## 2021-05-03 ENCOUNTER — Ambulatory Visit: Payer: Medicare PPO | Admitting: Physician Assistant

## 2021-05-03 ENCOUNTER — Encounter: Payer: Self-pay | Admitting: Physician Assistant

## 2021-05-03 ENCOUNTER — Other Ambulatory Visit: Payer: Self-pay

## 2021-05-03 DIAGNOSIS — E890 Postprocedural hypothyroidism: Secondary | ICD-10-CM | POA: Diagnosis not present

## 2021-05-03 DIAGNOSIS — I6523 Occlusion and stenosis of bilateral carotid arteries: Secondary | ICD-10-CM

## 2021-05-03 DIAGNOSIS — I700 Atherosclerosis of aorta: Secondary | ICD-10-CM

## 2021-05-03 DIAGNOSIS — I10 Essential (primary) hypertension: Secondary | ICD-10-CM

## 2021-05-03 DIAGNOSIS — M8580 Other specified disorders of bone density and structure, unspecified site: Secondary | ICD-10-CM | POA: Diagnosis not present

## 2021-05-03 MED ORDER — HYDROCHLOROTHIAZIDE 12.5 MG PO TABS: 12.5000 mg | ORAL_TABLET | Freq: Every day | ORAL | 1 refills | Status: DC

## 2021-05-03 MED ORDER — ROSUVASTATIN CALCIUM 5 MG PO TABS: 5.0000 mg | ORAL_TABLET | Freq: Every day | ORAL | 3 refills | Status: DC

## 2021-05-03 NOTE — Progress Notes (Signed)
[facility] [place], [place] [address]  Internal MEDICINE  Office Visit Note  Patient Name: Hailey Wong  [id number]  [id number]  Date of Service: 05/04/2021  Chief Complaint  Patient presents with   Follow-up   Hypertension    HPI Pt is here for routine follow up -BP normally 130-140, but has had a busy morning which may be why its a little elevated -Taking calcium and vitamin D daily since hearing BMD results. Had been recommended to start on Boniva to help prevent further bone loss, but patient would rather hold off on this and continue with the Calcium and vit D for now. Patient may further discuss with endocrinology as well. Estimated risk of future fractures from BMD reviewed with patient. Patient states she is pretty active and denies any recent falls. -Sees endocrinology in March for hypothyroidism s/p thyroidectomy for papillary thyroid cancer  -had her colonoscopy and mammogram as well  Current Medication: Outpatient Encounter Medications as of 05/03/2021  Medication Sig   ASPIRIN 81 PO Take by mouth.   fluticasone (FLONASE) 50 MCG/ACT nasal spray Place 1 spray into both nostrils daily as needed for allergies or rhinitis.   levothyroxine (SYNTHROID) 137 MCG tablet Take 137 mcg by mouth daily before breakfast.   lisinopril (ZESTRIL) 10 MG tablet TAKE 1 TABLET BY MOUTH TWICE DAILY   [DISCONTINUED] hydrochlorothiazide (HYDRODIURIL) 12.5 MG tablet TAKE 1 TABLET BY MOUTH ONCE DAILY   [DISCONTINUED] rosuvastatin (CRESTOR) 5 MG tablet Take 1 tablet (5 mg total) by mouth daily.   hydrochlorothiazide (HYDRODIURIL) 12.5 MG tablet Take 1 tablet (12.5 mg total) by mouth daily.   rosuvastatin (CRESTOR) 5 MG tablet Take 1 tablet (5 mg total) by mouth daily.   No facility-administered encounter medications on file as of 05/03/2021.    Surgical History: Past Surgical History:  Procedure Laterality Date   BROW LIFT Bilateral 12/13/2019   Procedure:  BLEPHAROPLASTY UPPER EYELID; W/EXCESS SKIN BLEPHAROPTOSIS REPAIR; RESECT EX;  Surgeon: Karle Starch, MD;  Location: [facility];  Service: Ophthalmology;  Laterality: Bilateral;   COLONOSCOPY WITH PROPOFOL N/A 03/02/2021   Procedure: COLONOSCOPY WITH PROPOFOL;  Surgeon: Jonathon Bellows, MD;  Location: [facility] ENDOSCOPY;  Service: Gastroenterology;  Laterality: N/A;   right ankle surgery  1997   THYROIDECTOMY Bilateral 10/20/2020   Procedure: THYROIDECTOMY;  Surgeon: Beverly Gust, MD;  Location: [facility];  Service: ENT;  Laterality: Bilateral;   TUBAL LIGATION  1977    Medical History: Past Medical History:  Diagnosis Date   Chronic cough    Hypertension    Hypothyroidism    Mild cardiomegaly    Sinus trouble    Sleep apnea    no CPAP    Family History: Family History  Problem Relation Age of Onset   Hypertension Mother    Pulmonary fibrosis Father    Breast cancer Sister     Social History   Socioeconomic History   Marital status: Married    Spouse name: Not on file   Number of children: Not on file   Years of education: Not on file   Highest education level: Not on file  Occupational History   Not on file  Tobacco Use   Smoking status: Every Day    Packs/day: 1.00    Years: 50.00    Pack years: 50.00    Types: Cigarettes   Smokeless tobacco: Never  Vaping Use   Vaping Use: Never used  Substance and Sexual Activity   Alcohol  use: Yes    Alcohol/week: 7.0 standard drinks    Types: 7 Glasses of wine per week    Comment: wine daily   Drug use: Never   Sexual activity: Not on file  Other Topics Concern   Not on file  Social History Narrative   Not on file   Social Determinants of Health   Financial Resource Strain: Not on file  Food Insecurity: Not on file  Transportation Needs: Not on file  Physical Activity: Not on file  Stress: Not on file  Social Connections: Not on file  Intimate Partner Violence: Not on file      Review of Systems   Constitutional:  Negative for chills, fatigue and unexpected weight change.  HENT:  Negative for congestion, postnasal drip, rhinorrhea, sneezing and sore throat.   Eyes:  Negative for redness.  Respiratory:  Negative for cough, chest tightness and shortness of breath.   Cardiovascular:  Negative for chest pain and palpitations.  Gastrointestinal:  Negative for abdominal pain, constipation, diarrhea, nausea and vomiting.  Genitourinary:  Negative for dysuria and frequency.  Musculoskeletal:  Negative for arthralgias, back pain, joint swelling and neck pain.  Skin:  Negative for rash.  Neurological: Negative.  Negative for tremors and numbness.  Hematological:  Negative for adenopathy. Does not bruise/bleed easily.  Psychiatric/Behavioral:  Negative for behavioral problems (Depression), sleep disturbance and suicidal ideas. The patient is not nervous/anxious.    Vital Signs: BP (!) 142/82 Comment: 162/95   Pulse 66    Temp 97.9 F (36.6 C)    Resp 16    Ht 5\' 1"  (1.549 m)    Wt 157 lb 14.1 oz (71.6 kg)    SpO2 97%    BMI 29.83 kg/m    Physical Exam Vitals and nursing note reviewed.  Constitutional:      General: She is not in acute distress.    Appearance: She is well-developed. She is not diaphoretic.  HENT:     Head: Normocephalic and atraumatic.     Mouth/Throat:     Pharynx: No oropharyngeal exudate.  Eyes:     Pupils: Pupils are equal, round, and reactive to light.  Neck:     Thyroid: No thyromegaly.     Vascular: No JVD.     Trachea: No tracheal deviation.  Cardiovascular:     Rate and Rhythm: Normal rate and regular rhythm.     Heart sounds: Normal heart sounds. No murmur heard.   No friction rub. No gallop.  Pulmonary:     Effort: Pulmonary effort is normal. No respiratory distress.     Breath sounds: No wheezing or rales.  Chest:     Chest wall: No tenderness.  Abdominal:     General: Bowel sounds are normal.     Palpations: Abdomen is soft.  Musculoskeletal:         General: Normal range of motion.     Cervical back: Normal range of motion and neck supple.  Lymphadenopathy:     Cervical: No cervical adenopathy.  Skin:    General: Skin is warm and dry.  Neurological:     Mental Status: She is alert and oriented to person, place, and time.     Cranial Nerves: No cranial nerve deficit.  Psychiatric:        Behavior: Behavior normal.        Thought Content: Thought content normal.        Judgment: Judgment normal.       Assessment/Plan:  1. Benign hypertension Slightly elevated in office, normally well controlled at home. Will continue current medications and will call if elevated. - hydrochlorothiazide (HYDRODIURIL) 12.5 MG tablet; Take 1 tablet (12.5 mg total) by mouth daily.  Dispense: 90 tablet; Refill: 1  2. Aortic atherosclerosis ([place]) Continue crestor - rosuvastatin (CRESTOR) 5 MG tablet; Take 1 tablet (5 mg total) by mouth daily.  Dispense: 90 tablet; Refill: 3  3. Bilateral carotid artery stenosis Continue crestor - rosuvastatin (CRESTOR) 5 MG tablet; Take 1 tablet (5 mg total) by mouth daily.  Dispense: 90 tablet; Refill: 3  4. S/P total thyroidectomy Followed by endocrinology  5. Osteopenia, unspecified location Discussed BMD results further including future risk of fracture. Patient declines starting Boniva at this time and will continue with calcium and vitamin D supplement. She may discuss with endocrinology as well and will contact office if she changes her mind.   General Counseling: rachana malesky understanding of the findings of todays visit and agrees with plan of treatment. I have discussed any further diagnostic evaluation that may be needed or ordered today. We also reviewed her medications today. she has been encouraged to call the office with any questions or concerns that should arise related to todays visit.    No orders of the defined types were placed in this encounter.   Meds ordered this encounter   Medications   hydrochlorothiazide (HYDRODIURIL) 12.5 MG tablet    Sig: Take 1 tablet (12.5 mg total) by mouth daily.    Dispense:  90 tablet    Refill:  1   rosuvastatin (CRESTOR) 5 MG tablet    Sig: Take 1 tablet (5 mg total) by mouth daily.    Dispense:  90 tablet    Refill:  3    This patient was seen by Drema Dallas, PA-C in collaboration with Dr. Clayborn Bigness as a part of collaborative care agreement.   Total time spent:30 Minutes Time spent includes review of chart, medications, test results, and follow up plan with the patient.      Dr Lavera Guise Internal medicine

## 2021-05-10 DIAGNOSIS — U071 COVID-19: Secondary | ICD-10-CM | POA: Diagnosis not present

## 2021-05-10 DIAGNOSIS — R059 Cough, unspecified: Secondary | ICD-10-CM | POA: Diagnosis not present

## 2021-05-10 DIAGNOSIS — Z20822 Contact with and (suspected) exposure to covid-19: Secondary | ICD-10-CM | POA: Diagnosis not present

## 2021-05-17 ENCOUNTER — Other Ambulatory Visit: Payer: Self-pay | Admitting: Nurse Practitioner

## 2021-05-17 DIAGNOSIS — I10 Essential (primary) hypertension: Secondary | ICD-10-CM

## 2021-06-14 DIAGNOSIS — H524 Presbyopia: Secondary | ICD-10-CM | POA: Diagnosis not present

## 2021-06-14 DIAGNOSIS — H43813 Vitreous degeneration, bilateral: Secondary | ICD-10-CM | POA: Diagnosis not present

## 2021-07-06 DIAGNOSIS — H2511 Age-related nuclear cataract, right eye: Secondary | ICD-10-CM | POA: Diagnosis not present

## 2021-07-10 IMAGING — CR DG CHEST 2V
1 series · 2 of 2 positions shown · non-contrast
Comparison: 05/25/2016

CLINICAL DATA: Chest pain

EXAM:
CHEST - 2 VIEW

[Series 1: dg chest 2 view · 0.14mm/px · 2 of 2 slices shown]
[im 1/2]
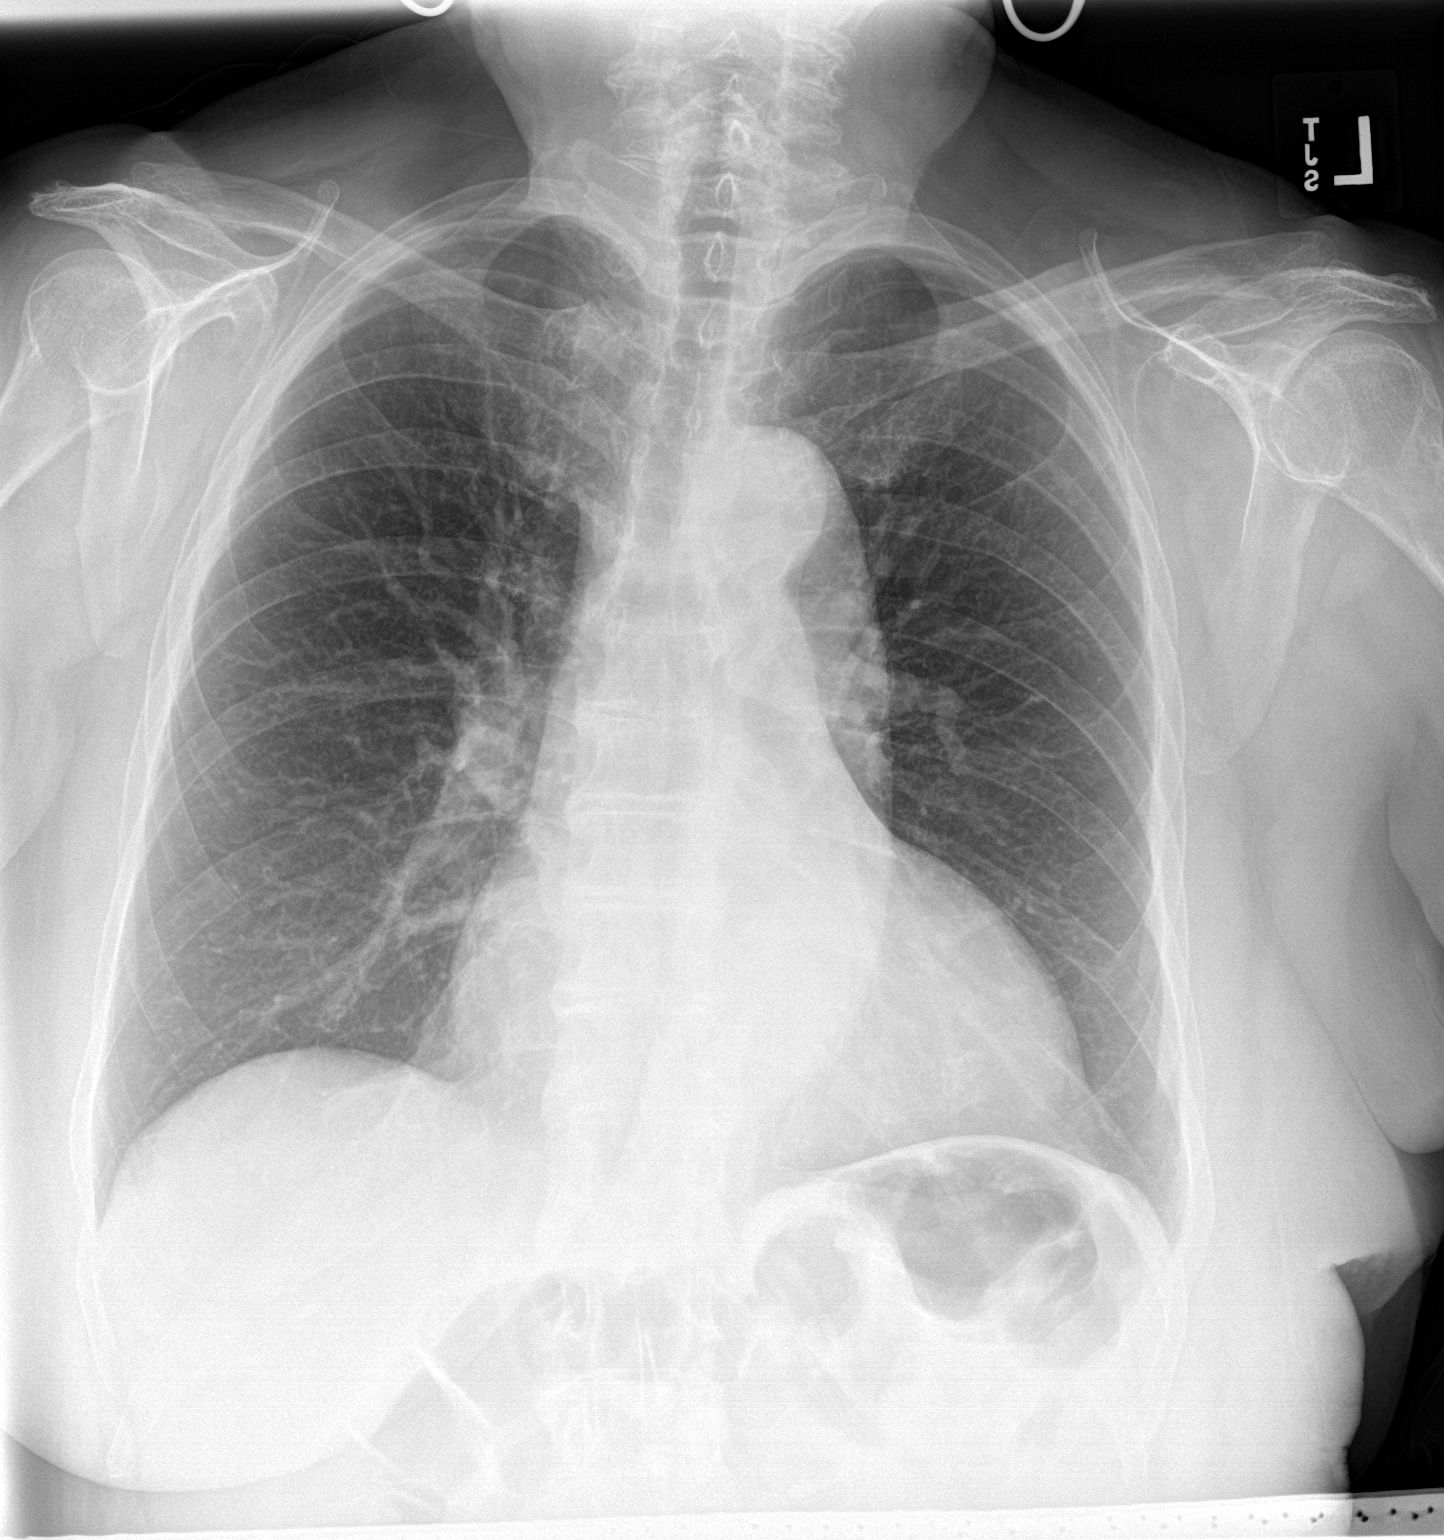
[im 2/2]
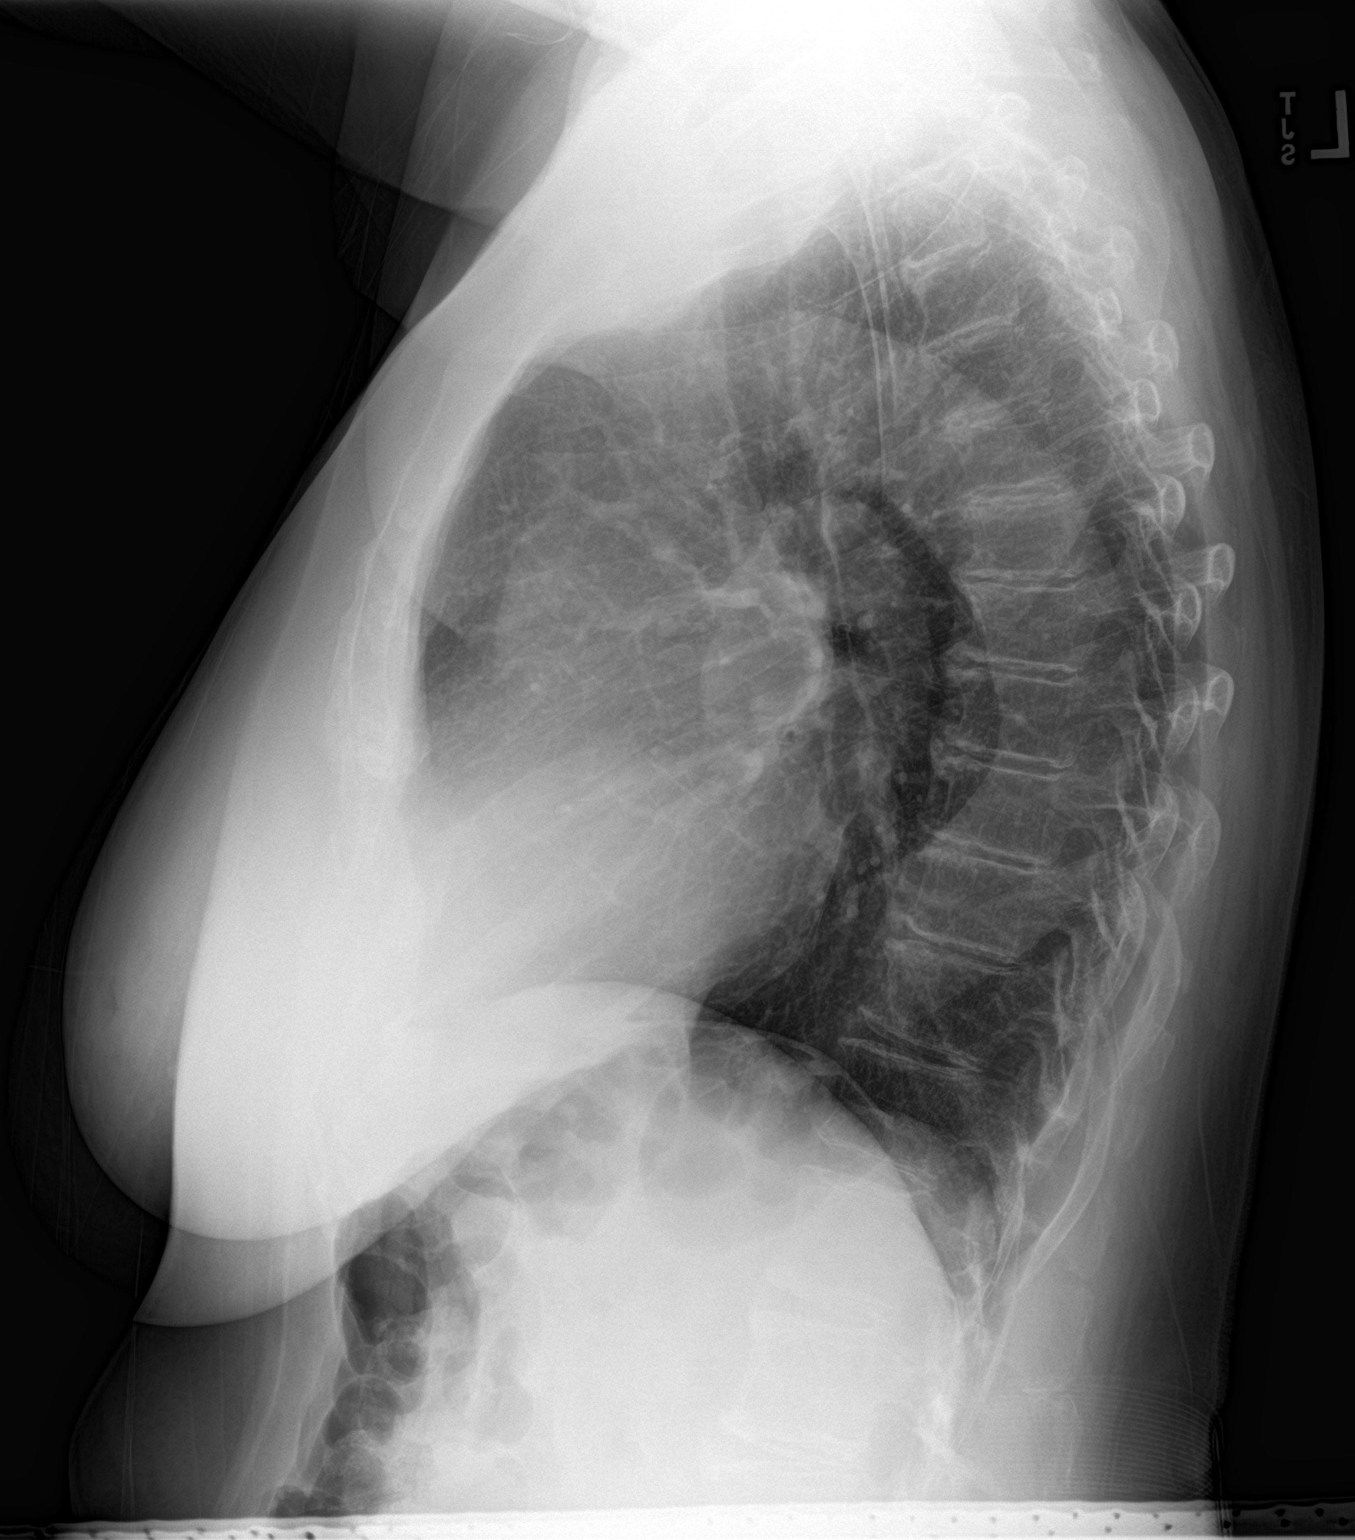

[2 of 2 positions shown; findings below may reference images not displayed]

FINDINGS: Cardiac shadow is mildly enlarged. Aortic calcifications are noted.
The lungs are clear bilaterally. Degenerative changes of the
thoracic spine are noted. No acute abnormality is noted.
IMPRESSION: No acute abnormality noted.

## 2021-07-12 ENCOUNTER — Encounter: Payer: Self-pay | Admitting: Ophthalmology

## 2021-07-19 NOTE — Discharge Instructions (Signed)

## 2021-07-21 ENCOUNTER — Encounter: Payer: Self-pay | Admitting: Ophthalmology

## 2021-07-21 ENCOUNTER — Ambulatory Visit: Payer: Medicare PPO | Admitting: Anesthesiology

## 2021-07-21 ENCOUNTER — Ambulatory Visit
Admission: RE | Admit: 2021-07-21 | Discharge: 2021-07-21 | Disposition: A | Discharge status: Home / Self Care | Payer: Medicare PPO | Source: Ambulatory Visit | Attending: Ophthalmology | Admitting: Ophthalmology

## 2021-07-21 ENCOUNTER — Other Ambulatory Visit: Payer: Self-pay

## 2021-07-21 ENCOUNTER — Encounter
Admission: RE | Disposition: A | Discharge status: Home / Self Care | Payer: Self-pay | Source: Ambulatory Visit | Attending: Ophthalmology

## 2021-07-21 DIAGNOSIS — E039 Hypothyroidism, unspecified: Secondary | ICD-10-CM | POA: Diagnosis not present

## 2021-07-21 DIAGNOSIS — Z79890 Hormone replacement therapy (postmenopausal): Secondary | ICD-10-CM | POA: Diagnosis not present

## 2021-07-21 DIAGNOSIS — I10 Essential (primary) hypertension: Secondary | ICD-10-CM | POA: Insufficient documentation

## 2021-07-21 DIAGNOSIS — H25811 Combined forms of age-related cataract, right eye: Secondary | ICD-10-CM | POA: Diagnosis not present

## 2021-07-21 DIAGNOSIS — G4730 Sleep apnea, unspecified: Secondary | ICD-10-CM | POA: Insufficient documentation

## 2021-07-21 DIAGNOSIS — H2511 Age-related nuclear cataract, right eye: Secondary | ICD-10-CM | POA: Insufficient documentation

## 2021-07-21 DIAGNOSIS — Z8616 Personal history of COVID-19: Secondary | ICD-10-CM | POA: Diagnosis not present

## 2021-07-21 DIAGNOSIS — Z79899 Other long term (current) drug therapy: Secondary | ICD-10-CM | POA: Diagnosis not present

## 2021-07-21 DIAGNOSIS — F17210 Nicotine dependence, cigarettes, uncomplicated: Secondary | ICD-10-CM | POA: Diagnosis not present

## 2021-07-21 HISTORY — DX: Personal history of COVID-19: Z86.16

## 2021-07-21 HISTORY — PX: CATARACT EXTRACTION W/PHACO: SHX586

## 2021-07-21 SURGERY — PHACOEMULSIFICATION, CATARACT, WITH IOL INSERTION
Anesthesia: Monitor Anesthesia Care | Site: Eye | Laterality: Right

## 2021-07-21 MED ORDER — BRIMONIDINE TARTRATE-TIMOLOL 0.2-0.5 % OP SOLN
OPHTHALMIC | Status: DC | PRN
  Administered 2021-07-21: 1 [drp] via OPHTHALMIC

## 2021-07-21 MED ORDER — SIGHTPATH DOSE#1 NA HYALUR & NA CHOND-NA HYALUR IO KIT
PACK | INTRAOCULAR | Status: DC | PRN
  Administered 2021-07-21: 1 via OPHTHALMIC

## 2021-07-21 MED ORDER — TETRACAINE HCL 0.5 % OP SOLN
1.0000 [drp] | OPHTHALMIC | Status: DC | PRN
  Administered 2021-07-21 (×3): 1 [drp] via OPHTHALMIC

## 2021-07-21 MED ORDER — SIGHTPATH DOSE#1 BSS IO SOLN
INTRAOCULAR | Status: DC | PRN
  Administered 2021-07-21: 102 mL via OPHTHALMIC

## 2021-07-21 MED ORDER — ACETAMINOPHEN 10 MG/ML IV SOLN: 1000.0000 mg | Freq: Once | INTRAVENOUS | Status: DC | PRN

## 2021-07-21 MED ORDER — FENTANYL CITRATE (PF) 100 MCG/2ML IJ SOLN
INTRAMUSCULAR | Status: DC | PRN
  Administered 2021-07-21 (×2): 50 ug via INTRAVENOUS

## 2021-07-21 MED ORDER — CEFUROXIME OPHTHALMIC INJECTION 1 MG/0.1 ML
INJECTION | OPHTHALMIC | Status: DC | PRN
  Administered 2021-07-21: 0.1 mL via INTRACAMERAL

## 2021-07-21 MED ORDER — ONDANSETRON HCL 4 MG/2ML IJ SOLN: 4.0000 mg | Freq: Once | INTRAMUSCULAR | Status: DC | PRN

## 2021-07-21 MED ORDER — SIGHTPATH DOSE#1 BSS IO SOLN
INTRAOCULAR | Status: DC | PRN
  Administered 2021-07-21: 1 mL via INTRAMUSCULAR

## 2021-07-21 MED ORDER — ARMC OPHTHALMIC DILATING DROPS
1.0000 "application " | OPHTHALMIC | Status: DC | PRN
  Administered 2021-07-21 (×3): 1 via OPHTHALMIC

## 2021-07-21 MED ORDER — SIGHTPATH DOSE#1 BSS IO SOLN
INTRAOCULAR | Status: DC | PRN
Start: 2021-07-21 — End: 2021-07-21
  Administered 2021-07-21: 15 mL

## 2021-07-21 MED ORDER — LACTATED RINGERS IV SOLN: INTRAVENOUS | Status: DC

## 2021-07-21 MED ORDER — MIDAZOLAM HCL 2 MG/2ML IJ SOLN
INTRAMUSCULAR | Status: DC | PRN
  Administered 2021-07-21: 2 mg via INTRAVENOUS

## 2021-07-21 SURGICAL SUPPLY — 11 items
CATARACT SUITE SIGHTPATH (MISCELLANEOUS) ×2 IMPLANT
FEE CATARACT SUITE SIGHTPATH (MISCELLANEOUS) ×1 IMPLANT
GLOVE SRG 8 PF TXTR STRL LF DI (GLOVE) ×1 IMPLANT
GLOVE SURG ENC TEXT LTX SZ7.5 (GLOVE) ×2 IMPLANT
GLOVE SURG UNDER POLY LF SZ8 (GLOVE) ×2
LENS IOL TECNIS EYHANCE 20.5 (Intraocular Lens) ×1 IMPLANT
NDL FILTER BLUNT 18X1 1/2 (NEEDLE) ×1 IMPLANT
NEEDLE FILTER BLUNT 18X 1/2SAF (NEEDLE) ×1
NEEDLE FILTER BLUNT 18X1 1/2 (NEEDLE) ×1 IMPLANT
SYR 3ML LL SCALE MARK (SYRINGE) ×2 IMPLANT
WATER STERILE IRR 250ML POUR (IV SOLUTION) ×2 IMPLANT

## 2021-07-21 NOTE — Anesthesia Postprocedure Evaluation (Signed)
Anesthesia Post Note ? ?Patient: Hailey Wong ? ?Procedure(s) Performed: CATARACT EXTRACTION PHACO AND INTRAOCULAR LENS PLACEMENT (IOC) RIGHT 12.60 01:30.4 (Right: Eye) ? ? ?  ?Patient location during evaluation: PACU ?Anesthesia Type: MAC ?Level of consciousness: awake and alert ?Pain management: pain level controlled ?Vital Signs Assessment: post-procedure vital signs reviewed and stable ?Respiratory status: spontaneous breathing, nonlabored ventilation, respiratory function stable and patient connected to nasal cannula oxygen ?Cardiovascular status: stable and blood pressure returned to baseline ?Postop Assessment: no apparent nausea or vomiting ?Anesthetic complications: no ? ? ?No notable events documented. ? ?Jebadiah Imperato A  Minh Jasper ? ? ? ? ? ?

## 2021-07-21 NOTE — Transfer of Care (Signed)
Immediate Anesthesia Transfer of Care Note ? ?Patient: Hailey Wong ? ?Procedure(s) Performed: CATARACT EXTRACTION PHACO AND INTRAOCULAR LENS PLACEMENT (IOC) RIGHT 12.60 01:30.4 (Right: Eye) ? ?Patient Location: PACU ? ?Anesthesia Type: MAC ? ?Level of Consciousness: awake, alert  and patient cooperative ? ?Airway and Oxygen Therapy: Patient Spontanous Breathing and Patient connected to supplemental oxygen ? ?Post-op Assessment: Post-op Vital signs reviewed, Patient's Cardiovascular Status Stable, Respiratory Function Stable, Patent Airway and No signs of Nausea or vomiting ? ?Post-op Vital Signs: Reviewed and stable ? ?Complications: No notable events documented. ? ?

## 2021-07-21 NOTE — Op Note (Signed)
LOCATION:  [place] ?  ?PREOPERATIVE DIAGNOSIS:    Nuclear sclerotic cataract right eye. H25.11 ?  ?POSTOPERATIVE DIAGNOSIS:  Nuclear sclerotic cataract right eye.   ?  ?PROCEDURE:  Phacoemusification with posterior chamber intraocular lens placement of the right eye  ? ?ULTRASOUND TIME: Procedure(s): ?CATARACT EXTRACTION PHACO AND INTRAOCULAR LENS PLACEMENT (IOC) RIGHT 12.60 01:30.4 (Right) ? ?LENS:   ?Implant Name Type Inv. Item Serial No. Manufacturer Lot No. LRB No. Used Action  ?LENS IOL TECNIS EYHANCE 20.5 - [id number] Intraocular Lens LENS IOL TECNIS EYHANCE 20.5 [id number] [employer]  Right 1 Implanted  ?   ?  ?  ?SURGEON:  [name], MD ?  ?ANESTHESIA:  Topical with tetracaine drops and 2% Xylocaine jelly, augmented with 1% preservative-free intracameral lidocaine. ? ?  ?COMPLICATIONS:  None. ?  ?DESCRIPTION OF PROCEDURE:  The patient was identified in the holding room and transported to the operating room and placed in the supine position under the operating microscope.  The right eye was identified as the operative eye and it was prepped and draped in the usual sterile ophthalmic fashion. ?  ?A 1 millimeter clear-corneal paracentesis was made at the 12:00 position.  0.5 ml of preservative-free 1% lidocaine was injected into the anterior chamber. ?The anterior chamber was filled with Viscoat viscoelastic.  A 2.4 millimeter keratome was used to make a near-clear corneal incision at the 9:00 position.  A curvilinear capsulorrhexis was made with a cystotome and capsulorrhexis forceps.  Balanced salt solution was used to hydrodissect and hydrodelineate the nucleus. ?  ?Phacoemulsification was then used in stop and chop fashion to remove the lens nucleus and epinucleus.  The remaining cortex was then removed using the irrigation and aspiration handpiece. Provisc was then placed into the capsular bag to distend it for lens placement.  A lens was then injected into the capsular bag.  The  remaining viscoelastic was aspirated. ?  ?Wounds were hydrated with balanced salt solution.  The anterior chamber was inflated to a physiologic pressure with balanced salt solution.  No wound leaks were noted. Cefuroxime 0.1 ml of a '10mg'$ /ml solution was injected into the anterior chamber for a dose of 1 mg of intracameral antibiotic at the completion of the case. ?  Timolol and Brimonidine drops were applied to the eye.  The patient was taken to the recovery room in stable condition without complications of anesthesia or surgery. ? ? ?Hailey Wong ?07/21/2021, 9:14 AM ? ?

## 2021-07-21 NOTE — Anesthesia Procedure Notes (Signed)
Procedure Name: [place] ?Date/Time: 07/21/2021 8:55 AM ?Performed by: Jeannene Patella, CRNA ?Pre-anesthesia Checklist: Patient identified, Emergency Drugs available, Suction available, Timeout performed and Patient being monitored ?Patient Re-evaluated:Patient Re-evaluated prior to induction ?Oxygen Delivery Method: Nasal cannula ?Placement Confirmation: positive ETCO2 ? ? ? ? ?

## 2021-07-21 NOTE — H&P (Signed)
[place]  ? ?Primary Care Physician:  Lavera Guise, MD ?Ophthalmologist: Dr. Leandrew Koyanagi ? ?Pre-Procedure History & Physical: ?HPI:  Hailey Wong is a 76 y.o. female here for ophthalmic surgery. ?  ?Past Medical History:  ?Diagnosis Date  ? Chronic cough   ? History of COVID-19   ? 04/2021  ? Hypertension   ? Hypothyroidism   ? Mild cardiomegaly   ? Sinus trouble   ? Sleep apnea   ? no CPAP  ? ? ?Past Surgical History:  ?Procedure Laterality Date  ? BROW LIFT Bilateral 12/13/2019  ? Procedure: BLEPHAROPLASTY UPPER EYELID; W/EXCESS SKIN BLEPHAROPTOSIS REPAIR; RESECT EX;  Surgeon: Karle Starch, MD;  Location: [place];  Service: Ophthalmology;  Laterality: Bilateral;  ? COLONOSCOPY WITH PROPOFOL N/A 03/02/2021  ? Procedure: COLONOSCOPY WITH PROPOFOL;  Surgeon: Jonathon Bellows, MD;  Location: [facility];  Service: Gastroenterology;  Laterality: N/A;  ? right ankle surgery  1997  ? THYROIDECTOMY Bilateral 10/20/2020  ? Procedure: THYROIDECTOMY;  Surgeon: Beverly Gust, MD;  Location: [facility] ORS;  Service: ENT;  Laterality: Bilateral;  ? [place]  ? ? ?Prior to Admission medications   ?Medication Sig Start Date End Date Taking? Authorizing Provider  ?ASPIRIN 81 PO Take by mouth.   Yes [provider]  ?calcium carbonate (OS-CAL) 600 MG TABS tablet Take 600 mg by mouth 2 (two) times daily with a meal.   Yes [provider]  ?Cholecalciferol (D3 ADULT PO) Take by mouth.   Yes [provider]  ?fluticasone (FLONASE) 50 MCG/ACT nasal spray Place 1 spray into both nostrils daily as needed for allergies or rhinitis.   Yes [provider]  ?hydrochlorothiazide (HYDRODIURIL) 12.5 MG tablet Take 1 tablet (12.5 mg total) by mouth daily. 05/03/21  Yes McDonough, Lauren K, PA-C  ?levothyroxine (SYNTHROID) 137 MCG tablet Take 137 mcg by mouth daily before breakfast.   Yes [provider]  ?lisinopril (ZESTRIL) 10 MG tablet TAKE 1 TABLET BY MOUTH  TWICE DAILY 05/17/21  Yes McDonough, Lauren K, PA-C  ?rosuvastatin (CRESTOR) 5 MG tablet Take 1 tablet (5 mg total) by mouth daily. 05/03/21  Yes McDonough, Si Gaul, PA-C  ?Sennosides-Docusate Sodium 8.6-50 MG CAPS Take by mouth.   Yes [provider]  ? ? ?Allergies as of 06/15/2021  ? (No Known Allergies)  ? ? ?Family History  ?Problem Relation Age of Onset  ? Hypertension Mother   ? Pulmonary fibrosis Father   ? Breast cancer Sister   ? ? ?Social History  ? ?Socioeconomic History  ? Marital status: Married  ?  Spouse name: Not on file  ? Number of children: Not on file  ? Years of education: Not on file  ? Highest education level: Not on file  ?Occupational History  ? Not on file  ?Tobacco Use  ? Smoking status: Every Day  ?  Packs/day: 1.00  ?  Years: 50.00  ?  Pack years: 50.00  ?  Types: Cigarettes  ? Smokeless tobacco: Never  ?Vaping Use  ? Vaping Use: Never used  ?Substance and Sexual Activity  ? Alcohol use: Yes  ?  Alcohol/week: 7.0 standard drinks  ?  Types: 7 Glasses of wine per week  ?  Comment: wine daily  ? Drug use: Never  ? Sexual activity: Not on file  ?Other Topics Concern  ? Not on file  ?Social History Narrative  ? Not on file  ? ?Social Determinants of Health  ? ?  Financial Resource Strain: Not on file  ?Food Insecurity: Not on file  ?Transportation Needs: Not on file  ?Physical Activity: Not on file  ?Stress: Not on file  ?Social Connections: Not on file  ?Intimate Partner Violence: Not on file  ? ? ?Review of Systems: ?See HPI, otherwise negative ROS ? ?Physical Exam: ?BP (!) 148/78   Pulse 66   Temp 97.7 ?F (36.5 ?C) (Temporal)   Ht '5\' 1"'$  (1.549 m)   Wt 67.8 kg   SpO2 100%   BMI 28.25 kg/m?  ?General:   Alert,  pleasant and cooperative in NAD ?Head:  Normocephalic and atraumatic. ?Lungs:  Clear to auscultation.    ?Heart:  Regular rate and rhythm.  ? ?Impression/Plan: ?Hailey Wong is here for ophthalmic surgery. ? ?Risks, benefits, limitations, and alternatives regarding  ophthalmic surgery have been reviewed with the patient.  Questions have been answered.  All parties agreeable. ? ? Leandrew Koyanagi, MD  07/21/2021, 8:26 AM ? ? ?

## 2021-07-21 NOTE — Anesthesia Preprocedure Evaluation (Signed)
Anesthesia Evaluation  ?Patient identified by MRN, date of birth, ID band ?Patient awake ? ? ? ?Reviewed: ?Allergy & Precautions, NPO status , Patient's Chart, lab work & pertinent test results, reviewed documented beta blocker date and time  ? ?History of Anesthesia Complications ?Negative for: history of anesthetic complications ? ?Airway ?Mallampati: III ? ?TM Distance: >3 FB ?Neck ROM: Full ? ? ? Dental ?  ?Pulmonary ?sleep apnea , Current Smoker and Patient abstained from smoking.,  ?  ?breath sounds clear to auscultation ? ? ? ? ? ? Cardiovascular ?hypertension, (-) angina(-) DOE  ?Rhythm:Regular Rate:Normal ? ? ?  ?Neuro/Psych ?  ? GI/Hepatic ?neg GERD  ,  ?Endo/Other  ?Hypothyroidism  ? Renal/GU ?  ? ?  ?Musculoskeletal ? ? Abdominal ?  ?Peds ? Hematology ?  ?Anesthesia Other Findings ? ? Reproductive/Obstetrics ? ?  ? ? ? ? ? ? ? ? ? ? ? ? ? ?  ?  ? ? ? ? ? ? ? ? ?Anesthesia Physical ?Anesthesia Plan ? ?ASA: 2 ? ?Anesthesia Plan: MAC  ? ?Post-op Pain Management:   ? ?Induction: Intravenous ? ?PONV Risk Score and Plan: 1 and TIVA, Midazolam and Treatment may vary due to age or medical condition ? ?Airway Management Planned: Nasal Cannula ? ?Additional Equipment:  ? ?Intra-op Plan:  ? ?Post-operative Plan:  ? ?Informed Consent: I have reviewed the patients History and Physical, chart, labs and discussed the procedure including the risks, benefits and alternatives for the proposed anesthesia with the patient or authorized representative who has indicated his/her understanding and acceptance.  ? ? ? ? ? ?Plan Discussed with: CRNA and Anesthesiologist ? ?Anesthesia Plan Comments:   ? ? ? ? ? ? ?Anesthesia Quick Evaluation ? ?

## 2021-07-22 ENCOUNTER — Other Ambulatory Visit: Payer: Self-pay

## 2021-07-22 ENCOUNTER — Encounter: Payer: Self-pay | Admitting: Ophthalmology

## 2021-08-02 NOTE — Discharge Instructions (Signed)

## 2021-08-04 ENCOUNTER — Other Ambulatory Visit: Payer: Self-pay

## 2021-08-04 ENCOUNTER — Encounter
Admission: RE | Disposition: A | Discharge status: Home / Self Care | Payer: Self-pay | Source: Home / Self Care | Attending: Ophthalmology

## 2021-08-04 ENCOUNTER — Ambulatory Visit
Admission: RE | Admit: 2021-08-04 | Discharge: 2021-08-04 | Disposition: A | Discharge status: Home / Self Care | Payer: Medicare PPO | Attending: Ophthalmology | Admitting: Ophthalmology

## 2021-08-04 ENCOUNTER — Ambulatory Visit: Payer: Medicare PPO | Admitting: Anesthesiology

## 2021-08-04 DIAGNOSIS — Z8616 Personal history of COVID-19: Secondary | ICD-10-CM | POA: Diagnosis not present

## 2021-08-04 DIAGNOSIS — F17210 Nicotine dependence, cigarettes, uncomplicated: Secondary | ICD-10-CM | POA: Diagnosis not present

## 2021-08-04 DIAGNOSIS — Z79899 Other long term (current) drug therapy: Secondary | ICD-10-CM | POA: Diagnosis not present

## 2021-08-04 DIAGNOSIS — G4730 Sleep apnea, unspecified: Secondary | ICD-10-CM | POA: Diagnosis not present

## 2021-08-04 DIAGNOSIS — Z79890 Hormone replacement therapy (postmenopausal): Secondary | ICD-10-CM | POA: Diagnosis not present

## 2021-08-04 DIAGNOSIS — H2512 Age-related nuclear cataract, left eye: Secondary | ICD-10-CM | POA: Insufficient documentation

## 2021-08-04 DIAGNOSIS — I10 Essential (primary) hypertension: Secondary | ICD-10-CM | POA: Diagnosis not present

## 2021-08-04 DIAGNOSIS — E039 Hypothyroidism, unspecified: Secondary | ICD-10-CM | POA: Diagnosis not present

## 2021-08-04 DIAGNOSIS — H25812 Combined forms of age-related cataract, left eye: Secondary | ICD-10-CM | POA: Diagnosis not present

## 2021-08-04 HISTORY — PX: CATARACT EXTRACTION W/PHACO: SHX586

## 2021-08-04 SURGERY — PHACOEMULSIFICATION, CATARACT, WITH IOL INSERTION
Anesthesia: Monitor Anesthesia Care | Site: Eye | Laterality: Left

## 2021-08-04 MED ORDER — ARMC OPHTHALMIC DILATING DROPS
1.0000 "application " | OPHTHALMIC | Status: DC | PRN
  Administered 2021-08-04 (×3): 1 via OPHTHALMIC

## 2021-08-04 MED ORDER — SIGHTPATH DOSE#1 NA HYALUR & NA CHOND-NA HYALUR IO KIT
PACK | INTRAOCULAR | Status: DC | PRN
  Administered 2021-08-04: 1 via OPHTHALMIC

## 2021-08-04 MED ORDER — SIGHTPATH DOSE#1 BSS IO SOLN
INTRAOCULAR | Status: DC | PRN
  Administered 2021-08-04: 15 mL

## 2021-08-04 MED ORDER — CEFUROXIME OPHTHALMIC INJECTION 1 MG/0.1 ML
INJECTION | OPHTHALMIC | Status: DC | PRN
  Administered 2021-08-04: 0.1 mL via INTRACAMERAL

## 2021-08-04 MED ORDER — BRIMONIDINE TARTRATE-TIMOLOL 0.2-0.5 % OP SOLN
OPHTHALMIC | Status: DC | PRN
  Administered 2021-08-04: 1 [drp] via OPHTHALMIC

## 2021-08-04 MED ORDER — EPINEPHRINE PF 1 MG/ML IJ SOLN
INTRAMUSCULAR | Status: DC | PRN
  Administered 2021-08-04: 78 mL via OPHTHALMIC

## 2021-08-04 MED ORDER — ACETAMINOPHEN 325 MG PO TABS: 325.0000 mg | ORAL_TABLET | Freq: Once | ORAL | Status: DC

## 2021-08-04 MED ORDER — ACETAMINOPHEN 160 MG/5ML PO SOLN: 325.0000 mg | Freq: Once | ORAL | Status: DC

## 2021-08-04 MED ORDER — MIDAZOLAM HCL 2 MG/2ML IJ SOLN
INTRAMUSCULAR | Status: DC | PRN
  Administered 2021-08-04: 2 mg via INTRAVENOUS

## 2021-08-04 MED ORDER — TETRACAINE HCL 0.5 % OP SOLN
1.0000 [drp] | OPHTHALMIC | Status: DC | PRN
  Administered 2021-08-04 (×3): 1 [drp] via OPHTHALMIC

## 2021-08-04 MED ORDER — SIGHTPATH DOSE#1 BSS IO SOLN
INTRAOCULAR | Status: DC | PRN
  Administered 2021-08-04: 1 mL via INTRAMUSCULAR

## 2021-08-04 MED ORDER — FENTANYL CITRATE (PF) 100 MCG/2ML IJ SOLN
INTRAMUSCULAR | Status: DC | PRN
  Administered 2021-08-04: 100 ug via INTRAVENOUS

## 2021-08-04 MED ORDER — LACTATED RINGERS IV SOLN: INTRAVENOUS | Status: DC

## 2021-08-04 SURGICAL SUPPLY — 11 items
CATARACT SUITE SIGHTPATH (MISCELLANEOUS) ×2 IMPLANT
FEE CATARACT SUITE SIGHTPATH (MISCELLANEOUS) ×1 IMPLANT
GLOVE SRG 8 PF TXTR STRL LF DI (GLOVE) ×1 IMPLANT
GLOVE SURG ENC TEXT LTX SZ7.5 (GLOVE) ×2 IMPLANT
GLOVE SURG UNDER POLY LF SZ8 (GLOVE) ×2
LENS IOL TECNIS EYHANCE 21.0 (Intraocular Lens) ×1 IMPLANT
NDL FILTER BLUNT 18X1 1/2 (NEEDLE) ×1 IMPLANT
NEEDLE FILTER BLUNT 18X 1/2SAF (NEEDLE) ×1
NEEDLE FILTER BLUNT 18X1 1/2 (NEEDLE) ×1 IMPLANT
SYR 3ML LL SCALE MARK (SYRINGE) ×2 IMPLANT
WATER STERILE IRR 250ML POUR (IV SOLUTION) ×2 IMPLANT

## 2021-08-04 NOTE — Transfer of Care (Signed)
Immediate Anesthesia Transfer of Care Note ? ?Patient: Hailey Wong ? ?Procedure(s) Performed: CATARACT EXTRACTION PHACO AND INTRAOCULAR LENS PLACEMENT (IOC) LEFT 6.26 01:02.6 (Left: Eye) ? ?Patient Location: PACU ? ?Anesthesia Type: MAC ? ?Level of Consciousness: awake, alert  and patient cooperative ? ?Airway and Oxygen Therapy: Patient Spontanous Breathing and Patient connected to supplemental oxygen ? ?Post-op Assessment: Post-op Vital signs reviewed, Patient's Cardiovascular Status Stable, Respiratory Function Stable, Patent Airway and No signs of Nausea or vomiting ? ?Post-op Vital Signs: Reviewed and stable ? ?Complications: No notable events documented. ? ?

## 2021-08-04 NOTE — Anesthesia Preprocedure Evaluation (Signed)
Anesthesia Evaluation  ?Patient identified by MRN, date of birth, ID band ?Patient awake ? ? ? ?Reviewed: ?Allergy & Precautions, H&P , NPO status , Patient's Chart, lab work & pertinent test results ? ?Airway ?Mallampati: II ? ?TM Distance: >3 FB ?Neck ROM: full ? ? ? Dental ?no notable dental hx. ? ?  ?Pulmonary ?sleep apnea , Current SmokerPatient did not abstain from smoking.,  ?  ?Pulmonary exam normal ?breath sounds clear to auscultation ? ? ? ? ? ? Cardiovascular ?hypertension, Normal cardiovascular exam ?Rhythm:regular Rate:Normal ? ? ?  ?Neuro/Psych ?  ? GI/Hepatic ?  ?Endo/Other  ?Hypothyroidism  ? Renal/GU ?  ? ?  ?Musculoskeletal ? ? Abdominal ?  ?Peds ? Hematology ?  ?Anesthesia Other Findings ? ? Reproductive/Obstetrics ? ?  ? ? ? ? ? ? ? ? ? ? ? ? ? ?  ?  ? ? ? ? ? ? ? ? ?Anesthesia Physical ?Anesthesia Plan ? ?ASA: 2 ? ?Anesthesia Plan: MAC  ? ?Post-op Pain Management: Minimal or no pain anticipated  ? ?Induction:  ? ?PONV Risk Score and Plan: 1 and Treatment may vary due to age or medical condition, TIVA and Midazolam ? ?Airway Management Planned:  ? ?Additional Equipment:  ? ?Intra-op Plan:  ? ?Post-operative Plan:  ? ?Informed Consent: I have reviewed the patients History and Physical, chart, labs and discussed the procedure including the risks, benefits and alternatives for the proposed anesthesia with the patient or authorized representative who has indicated his/her understanding and acceptance.  ? ? ? ?Dental Advisory Given ? ?Plan Discussed with: CRNA ? ?Anesthesia Plan Comments:   ? ? ? ? ? ? ?Anesthesia Quick Evaluation ? ?

## 2021-08-04 NOTE — Op Note (Signed)
Hailey NOTE ? ?INEZ Wong ?[id number] ?08/04/2021 ? ? ?PREOPERATIVE DIAGNOSIS:  Nuclear sclerotic cataract left eye. H25.12 ?  ?POSTOPERATIVE DIAGNOSIS:    Nuclear sclerotic cataract left eye.   ?  ?PROCEDURE:  Phacoemusification with posterior chamber intraocular lens placement of the left eye  ?Ultrasound time: Procedure(s): ?CATARACT EXTRACTION PHACO AND INTRAOCULAR LENS PLACEMENT (IOC) LEFT 6.26 01:02.6 (Left) ? ?LENS:   ?Implant Name Type Inv. Item Serial No. Manufacturer Lot No. LRB No. Used Action  ?LENS IOL TECNIS EYHANCE 21.0 - [id number] Intraocular Lens LENS IOL TECNIS EYHANCE 21.0 [id number] [employer]  Left 1 Implanted  ?   ? ?SURGEON:  [name], MD ?  ?ANESTHESIA:  Topical with tetracaine drops and 2% Xylocaine jelly, augmented with 1% preservative-free intracameral lidocaine. ? ?  ?COMPLICATIONS:  None. ?  ?DESCRIPTION OF PROCEDURE:  The patient was identified in the holding room and transported to the operating room and placed in the supine position under the operating microscope.  The left eye was identified as the Hailey eye and it was prepped and draped in the usual sterile ophthalmic fashion. ?  ?A 1 millimeter clear-corneal paracentesis was made at the 1:30 position.  0.5 ml of preservative-free 1% lidocaine was injected into the anterior chamber. ? The anterior chamber was filled with Viscoat viscoelastic.  A 2.4 millimeter keratome was used to make a near-clear corneal incision at the 10:30 position.  .  A curvilinear capsulorrhexis was made with a cystotome and capsulorrhexis forceps.  Balanced salt solution was used to hydrodissect and hydrodelineate the nucleus. ?  ?Phacoemulsification was then used in stop and chop fashion to remove the lens nucleus and epinucleus.  The remaining cortex was then removed using the irrigation and aspiration handpiece. Provisc was then placed into the capsular bag to distend it for lens placement.  A lens was then injected into the  capsular bag.  The remaining viscoelastic was aspirated. ?  ?Wounds were hydrated with balanced salt solution.  The anterior chamber was inflated to a physiologic pressure with balanced salt solution.  No wound leaks were noted. Cefuroxime 0.1 ml of a '10mg'$ /ml solution was injected into the anterior chamber for a dose of 1 mg of intracameral antibiotic at the completion of the case. ?  Timolol and Brimonidine drops were applied to the eye.  The patient was taken to the recovery room in stable condition without complications of anesthesia or surgery. ? ?[name] ?08/04/2021, 12:23 PM ? ?

## 2021-08-04 NOTE — Anesthesia Postprocedure Evaluation (Signed)
Anesthesia Post Note ? ?Patient: Hailey Wong ? ?Procedure(s) Performed: CATARACT EXTRACTION PHACO AND INTRAOCULAR LENS PLACEMENT (IOC) LEFT 6.26 01:02.6 (Left: Eye) ? ? ?  ?Patient location during evaluation: PACU ?Anesthesia Type: MAC ?Level of consciousness: awake and alert and oriented ?Pain management: satisfactory to patient ?Vital Signs Assessment: post-procedure vital signs reviewed and stable ?Respiratory status: spontaneous breathing, nonlabored ventilation and respiratory function stable ?Cardiovascular status: blood pressure returned to baseline and stable ?Postop Assessment: Adequate PO intake and No signs of nausea or vomiting ?Anesthetic complications: no ? ? ?No notable events documented. ? ?Raliegh Ip ? ? ? ? ? ?

## 2021-08-04 NOTE — H&P (Signed)
?[facility]  ? ?Primary Care Physician:  Lavera Guise, MD ?Ophthalmologist: Dr. Leandrew Koyanagi ? ?Pre-Procedure History & Physical: ?HPI:  Hailey Wong is a 76 y.o. female here for ophthalmic surgery. ?  ?Past Medical History:  ?Diagnosis Date  ? Chronic cough   ? History of COVID-19   ? 04/2021  ? Hypertension   ? Hypothyroidism   ? Mild cardiomegaly   ? Sinus trouble   ? Sleep apnea   ? no CPAP  ? ? ?Past Surgical History:  ?Procedure Laterality Date  ? BROW LIFT Bilateral 12/13/2019  ? Procedure: BLEPHAROPLASTY UPPER EYELID; W/EXCESS SKIN BLEPHAROPTOSIS REPAIR; RESECT EX;  Surgeon: Karle Starch, MD;  Location: [place];  Service: Ophthalmology;  Laterality: Bilateral;  ? CATARACT EXTRACTION W/PHACO Right 07/21/2021  ? Procedure: CATARACT EXTRACTION PHACO AND INTRAOCULAR LENS PLACEMENT ([place]) RIGHT 12.60 01:30.4;  Surgeon: Leandrew Koyanagi, MD;  Location: [place];  Service: Ophthalmology;  Laterality: Right;  ? COLONOSCOPY WITH PROPOFOL N/A 03/02/2021  ? Procedure: COLONOSCOPY WITH PROPOFOL;  Surgeon: Jonathon Bellows, MD;  Location: [facility] ENDOSCOPY;  Service: Gastroenterology;  Laterality: N/A;  ? right ankle surgery  1997  ? THYROIDECTOMY Bilateral 10/20/2020  ? Procedure: THYROIDECTOMY;  Surgeon: Beverly Gust, MD;  Location: [facility] ORS;  Service: ENT;  Laterality: Bilateral;  ? [place]  ? ? ?Prior to Admission medications   ?Medication Sig Start Date End Date Taking? Authorizing Provider  ?hydrochlorothiazide (HYDRODIURIL) 12.5 MG tablet Take 1 tablet (12.5 mg total) by mouth daily. 05/03/21  Yes McDonough, Lauren K, PA-C  ?levothyroxine (SYNTHROID) 137 MCG tablet Take 137 mcg by mouth daily before breakfast.   Yes [provider]  ?lisinopril (ZESTRIL) 10 MG tablet TAKE 1 TABLET BY MOUTH TWICE DAILY 05/17/21  Yes McDonough, Si Gaul, PA-C  ?ASPIRIN 81 PO Take by mouth.    [provider]  ?calcium carbonate (OS-CAL) 600 MG TABS tablet Take 600 mg  by mouth 2 (two) times daily with a meal.    [provider]  ?Cholecalciferol (D3 ADULT PO) Take by mouth.    [provider]  ?fluticasone (FLONASE) 50 MCG/ACT nasal spray Place 1 spray into both nostrils daily as needed for allergies or rhinitis.    [provider]  ?rosuvastatin (CRESTOR) 5 MG tablet Take 1 tablet (5 mg total) by mouth daily. 05/03/21   McDonough, Si Gaul, PA-C  ?Sennosides-Docusate Sodium 8.6-50 MG CAPS Take by mouth.    [provider]  ? ? ?Allergies as of 06/15/2021  ? (No Known Allergies)  ? ? ?Family History  ?Problem Relation Age of Onset  ? Hypertension Mother   ? Pulmonary fibrosis Father   ? Breast cancer Sister   ? ? ?Social History  ? ?Socioeconomic History  ? Marital status: Married  ?  Spouse name: Not on file  ? Number of children: Not on file  ? Years of education: Not on file  ? Highest education level: Not on file  ?Occupational History  ? Not on file  ?Tobacco Use  ? Smoking status: Every Day  ?  Packs/day: 1.00  ?  Years: 50.00  ?  Pack years: 50.00  ?  Types: Cigarettes  ? Smokeless tobacco: Never  ?Vaping Use  ? Vaping Use: Never used  ?Substance and Sexual Activity  ? Alcohol use: Yes  ?  Alcohol/week: 7.0 standard drinks  ?  Types: 7 Glasses of wine per week  ?  Comment: wine daily  ?  Drug use: Never  ? Sexual activity: Not on file  ?Other Topics Concern  ? Not on file  ?Social History Narrative  ? Not on file  ? ?Social Determinants of Health  ? ?Financial Resource Strain: Not on file  ?Food Insecurity: Not on file  ?Transportation Needs: Not on file  ?Physical Activity: Not on file  ?Stress: Not on file  ?Social Connections: Not on file  ?Intimate Partner Violence: Not on file  ? ? ?Review of Systems: ?See HPI, otherwise negative ROS ? ?Physical Exam: ?BP (!) 167/85   Pulse 65   Temp 97.9 ?F (36.6 ?C) (Temporal)   Resp 20   Wt 67.1 kg   SpO2 100%   BMI 27.96 kg/m?  ?General:   Alert,  pleasant and cooperative in NAD ?Head:   Normocephalic and atraumatic. ?Lungs:  Clear to auscultation.    ?Heart:  Regular rate and rhythm.  ? ?Impression/Plan: ?Hailey Wong is here for ophthalmic surgery. ? ?Risks, benefits, limitations, and alternatives regarding ophthalmic surgery have been reviewed with the patient.  Questions have been answered.  All parties agreeable. ? ? Leandrew Koyanagi, MD  08/04/2021, 11:04 AM ? ?

## 2021-08-05 ENCOUNTER — Encounter: Payer: Self-pay | Admitting: Ophthalmology

## 2021-09-02 ENCOUNTER — Ambulatory Visit: Payer: Medicare PPO | Admitting: Physician Assistant

## 2021-09-02 ENCOUNTER — Encounter: Payer: Self-pay | Admitting: Physician Assistant

## 2021-09-02 DIAGNOSIS — E782 Mixed hyperlipidemia: Secondary | ICD-10-CM

## 2021-09-02 DIAGNOSIS — I700 Atherosclerosis of aorta: Secondary | ICD-10-CM | POA: Diagnosis not present

## 2021-09-02 DIAGNOSIS — I10 Essential (primary) hypertension: Secondary | ICD-10-CM

## 2021-09-02 DIAGNOSIS — E559 Vitamin D deficiency, unspecified: Secondary | ICD-10-CM | POA: Diagnosis not present

## 2021-09-02 DIAGNOSIS — R5383 Other fatigue: Secondary | ICD-10-CM | POA: Diagnosis not present

## 2021-09-02 DIAGNOSIS — E538 Deficiency of other specified B group vitamins: Secondary | ICD-10-CM | POA: Diagnosis not present

## 2021-09-02 DIAGNOSIS — L02229 Furuncle of trunk, unspecified: Secondary | ICD-10-CM | POA: Diagnosis not present

## 2021-09-02 MED ORDER — DOXYCYCLINE HYCLATE 100 MG PO TABS: 100.0000 mg | ORAL_TABLET | Freq: Two times a day (BID) | ORAL | 0 refills | Status: DC

## 2021-09-02 NOTE — Progress Notes (Signed)
[facility] [place], [place] [address]  Internal MEDICINE  Office Visit Note  Patient Name: Hailey Wong  [id number]  [id number]  Date of Service: 09/08/2021  Chief Complaint  Patient presents with   Follow-up   Hypertension   Back Problem    Has spot on back, has pus in it - has been there for a month    HPI Pt is here for routine follow up -Had recent cataract surgeries and is doing well -Goes back to endocrinology in June -BP at home 137-140/77 this morning. Normally in the 220U systolic at home. -Spot on her back that she noticed about a month ago and has some pus. Previously had a spot here that was seen by dermatology and though to be a small cyst. Area has drained some on its own  -Had covid in Jan that was pretty mild. Now still has a congested cough. No SOB or wheezing. Is feeling some nasal stuffiness. Using flonase when needed.  -Due for routine fasting labs prior to CPE  Current Medication: Outpatient Encounter Medications as of 09/02/2021  Medication Sig   ASPIRIN 81 PO Take by mouth.   calcium carbonate (OS-CAL) 600 MG TABS tablet Take 600 mg by mouth 2 (two) times daily with a meal.   Cholecalciferol (D3 ADULT PO) Take by mouth.   doxycycline (VIBRA-TABS) 100 MG tablet Take 1 tablet (100 mg total) by mouth 2 (two) times daily.   fluticasone (FLONASE) 50 MCG/ACT nasal spray Place 1 spray into both nostrils daily as needed for allergies or rhinitis.   hydrochlorothiazide (HYDRODIURIL) 12.5 MG tablet Take 1 tablet (12.5 mg total) by mouth daily.   levothyroxine (SYNTHROID) 137 MCG tablet Take 137 mcg by mouth daily before breakfast.   lisinopril (ZESTRIL) 10 MG tablet TAKE 1 TABLET BY MOUTH TWICE DAILY   rosuvastatin (CRESTOR) 5 MG tablet Take 1 tablet (5 mg total) by mouth daily.   Sennosides-Docusate Sodium 8.6-50 MG CAPS Take by mouth.   No facility-administered encounter medications on file as of 09/02/2021.    Surgical  History: Past Surgical History:  Procedure Laterality Date   BROW LIFT Bilateral 12/13/2019   Procedure: BLEPHAROPLASTY UPPER EYELID; W/EXCESS SKIN BLEPHAROPTOSIS REPAIR; RESECT EX;  Surgeon: Karle Starch, MD;  Location: [facility];  Service: Ophthalmology;  Laterality: Bilateral;   CATARACT EXTRACTION W/PHACO Right 07/21/2021   Procedure: CATARACT EXTRACTION PHACO AND INTRAOCULAR LENS PLACEMENT ([place]) RIGHT 12.60 01:30.4;  Surgeon: Leandrew Koyanagi, MD;  Location: [place];  Service: Ophthalmology;  Laterality: Right;   CATARACT EXTRACTION W/PHACO Left 08/04/2021   Procedure: CATARACT EXTRACTION PHACO AND INTRAOCULAR LENS PLACEMENT ([facility]) LEFT 6.26 01:02.6;  Surgeon: Leandrew Koyanagi, MD;  Location: [facility];  Service: Ophthalmology;  Laterality: Left;   COLONOSCOPY WITH PROPOFOL N/A 03/02/2021   Procedure: COLONOSCOPY WITH PROPOFOL;  Surgeon: Jonathon Bellows, MD;  Location: [facility] ENDOSCOPY;  Service: Gastroenterology;  Laterality: N/A;   right ankle surgery  1997   THYROIDECTOMY Bilateral 10/20/2020   Procedure: THYROIDECTOMY;  Surgeon: Beverly Gust, MD;  Location: [facility];  Service: ENT;  Laterality: Bilateral;   TUBAL LIGATION  1977    Medical History: Past Medical History:  Diagnosis Date   Chronic cough    History of COVID-19    04/2021   Hypertension    Hypothyroidism    Mild cardiomegaly    Sinus trouble    Sleep apnea    no CPAP    Family History: Family History  Problem Relation Age of Onset   Hypertension Mother    Pulmonary fibrosis Father    Breast cancer Sister     Social History   Socioeconomic History   Marital status: Married    Spouse name: Not on file   Number of children: Not on file   Years of education: Not on file   Highest education level: Not on file  Occupational History   Not on file  Tobacco Use   Smoking status: Every Day    Packs/day: 1.00    Years: 50.00    Pack years: 50.00    Types: Cigarettes    Smokeless tobacco: Never   Tobacco comments:    1 pack and a half daily  Vaping Use   Vaping Use: Never used  Substance and Sexual Activity   Alcohol use: Yes    Alcohol/week: 7.0 standard drinks    Types: 7 Glasses of wine per week    Comment: wine daily   Drug use: Never   Sexual activity: Not on file  Other Topics Concern   Not on file  Social History Narrative   Not on file   Social Determinants of Health   Financial Resource Strain: Not on file  Food Insecurity: Not on file  Transportation Needs: Not on file  Physical Activity: Not on file  Stress: Not on file  Social Connections: Not on file  Intimate Partner Violence: Not on file      Review of Systems  Constitutional:  Negative for chills, fatigue and unexpected weight change.  HENT:  Negative for congestion, postnasal drip, rhinorrhea, sneezing and sore throat.   Eyes:  Negative for redness.  Respiratory:  Negative for cough, chest tightness and shortness of breath.   Cardiovascular:  Negative for chest pain and palpitations.  Gastrointestinal:  Negative for abdominal pain, constipation, diarrhea, nausea and vomiting.  Genitourinary:  Negative for dysuria and frequency.  Musculoskeletal:  Negative for arthralgias, back pain, joint swelling and neck pain.  Skin:  Negative for rash.       Lesion on back  Neurological: Negative.  Negative for tremors and numbness.  Hematological:  Negative for adenopathy. Does not bruise/bleed easily.  Psychiatric/Behavioral:  Negative for behavioral problems (Depression), sleep disturbance and suicidal ideas. The patient is not nervous/anxious.    Vital Signs: BP 132/78 Comment: 148/91  Pulse 66   Temp 98.4 F (36.9 C)   Resp 16   Ht '5\' 1"'$  (1.549 m)   Wt 152 lb 3.2 oz (69 kg)   SpO2 100%   BMI 28.76 kg/m    Physical Exam Vitals and nursing note reviewed.  Constitutional:      General: She is not in acute distress.    Appearance: She is well-developed. She is not  diaphoretic.  HENT:     Head: Normocephalic and atraumatic.     Mouth/Throat:     Pharynx: No oropharyngeal exudate.  Eyes:     Pupils: Pupils are equal, round, and reactive to light.  Neck:     Thyroid: No thyromegaly.     Vascular: No JVD.     Trachea: No tracheal deviation.  Cardiovascular:     Rate and Rhythm: Normal rate and regular rhythm.     Heart sounds: Normal heart sounds. No murmur heard.   No friction rub. No gallop.  Pulmonary:     Effort: Pulmonary effort is normal. No respiratory distress.     Breath sounds: No wheezing or rales.  Chest:  Chest wall: No tenderness.  Abdominal:     General: Bowel sounds are normal.     Palpations: Abdomen is soft.  Musculoskeletal:        General: Normal range of motion.     Cervical back: Normal range of motion and neck supple.  Lymphadenopathy:     Cervical: No cervical adenopathy.  Skin:    General: Skin is warm and dry.     Findings: Lesion present.     Comments: Small .5cm boil on back with crusting from previous drainage. Mildly tender to palpation  Neurological:     Mental Status: She is alert and oriented to person, place, and time.     Cranial Nerves: No cranial nerve deficit.  Psychiatric:        Behavior: Behavior normal.        Thought Content: Thought content normal.        Judgment: Judgment normal.       Assessment/Plan: 1. Benign hypertension Stable, continue current medications  2. Boil of trunk Will go ahead and treat with Doxycycline BID with food and can use warm compress to help drain further and keep area clean. May follow up with dermatology regarding possible underlying cyst if bothersome and for general skin check. - doxycycline (VIBRA-TABS) 100 MG tablet; Take 1 tablet (100 mg total) by mouth 2 (two) times daily.  Dispense: 20 tablet; Refill: 0  3. Aortic atherosclerosis ([place]) Continue crestor and will update labs - Lipid Panel With LDL/HDL Ratio  4. Mixed hyperlipidemia Continue  crestor and will update labs - Lipid Panel With LDL/HDL Ratio  5. B12 deficiency - B12 and Folate Panel  6. Vitamin D deficiency - VITAMIN D 25 Hydroxy (Vit-D Deficiency, Fractures)  7. Other fatigue - CBC w/Diff/Platelet - Comprehensive metabolic panel - Lipid Panel With LDL/HDL Ratio - VITAMIN D 25 Hydroxy (Vit-D Deficiency, Fractures) - B12 and Folate Panel   General Counseling: Clarissa verbalizes understanding of the findings of todays visit and agrees with plan of treatment. I have discussed any further diagnostic evaluation that may be needed or ordered today. We also reviewed her medications today. she has been encouraged to call the office with any questions or concerns that should arise related to todays visit.    Orders Placed This Encounter  Procedures   CBC w/Diff/Platelet   Comprehensive metabolic panel   Lipid Panel With LDL/HDL Ratio   VITAMIN D 25 Hydroxy (Vit-D Deficiency, Fractures)   B12 and Folate Panel    Meds ordered this encounter  Medications   doxycycline (VIBRA-TABS) 100 MG tablet    Sig: Take 1 tablet (100 mg total) by mouth 2 (two) times daily.    Dispense:  20 tablet    Refill:  0    This patient was seen by Drema Dallas, PA-C in collaboration with Dr. Clayborn Bigness as a part of collaborative care agreement.   Total time spent:30 Minutes Time spent includes review of chart, medications, test results, and follow up plan with the patient.      Dr Lavera Guise Internal medicine

## 2021-09-17 ENCOUNTER — Ambulatory Visit: Payer: Medicare PPO | Admitting: Physician Assistant

## 2021-09-17 ENCOUNTER — Encounter: Payer: Self-pay | Admitting: Physician Assistant

## 2021-09-17 VITALS — BP 147/86 | HR 64 | Temp 98.3°F | Resp 16 | Ht 61.0 in | Wt 152.2 lb

## 2021-09-17 DIAGNOSIS — B029 Zoster without complications: Secondary | ICD-10-CM

## 2021-09-17 MED ORDER — PREDNISONE 10 MG PO TABS: ORAL_TABLET | ORAL | 0 refills | Status: DC

## 2021-09-17 MED ORDER — VALACYCLOVIR HCL 1 G PO TABS: 1000.0000 mg | ORAL_TABLET | Freq: Three times a day (TID) | ORAL | 0 refills | Status: DC

## 2021-09-17 MED ORDER — VALACYCLOVIR HCL 1 G PO TABS: 1000.0000 mg | ORAL_TABLET | Freq: Three times a day (TID) | ORAL | 0 refills | Status: AC

## 2021-09-17 MED ORDER — GABAPENTIN 100 MG PO CAPS: 100.0000 mg | ORAL_CAPSULE | Freq: Three times a day (TID) | ORAL | 1 refills | Status: DC

## 2021-09-17 NOTE — Progress Notes (Signed)
[facility] [place], [place] [address]  Internal MEDICINE  Office Visit Note  Patient Name: Hailey Wong  [id number]  [id number]  Date of Service: 09/17/2021  Chief Complaint  Patient presents with   Herpes Zoster    On back of head and neck - some ear ache   Blood Pressure Check    Needs BP rechecked - diastolic # keeps reading low on machine     HPI Pt is here for a sick visit. -On Monday had some stinging on back of neck and scalp. Felt some bumps along this area too. Pain if turning head.  -Last night had a little earache, but it went away quickly. No muffled hearing and no pain in office today -Some hoarseness as well that seems to come and go -BP normally well controlled but elevated in office possibly due to worry over possible shingles  Current Medication:  Outpatient Encounter Medications as of 09/17/2021  Medication Sig   ASPIRIN 81 PO Take by mouth.   calcium carbonate (OS-CAL) 600 MG TABS tablet Take 600 mg by mouth 2 (two) times daily with a meal.   Cholecalciferol (D3 ADULT PO) Take by mouth.   doxycycline (VIBRA-TABS) 100 MG tablet Take 1 tablet (100 mg total) by mouth 2 (two) times daily.   fluticasone (FLONASE) 50 MCG/ACT nasal spray Place 1 spray into both nostrils daily as needed for allergies or rhinitis.   gabapentin (NEURONTIN) 100 MG capsule Take 1 capsule (100 mg total) by mouth 3 (three) times daily.   hydrochlorothiazide (HYDRODIURIL) 12.5 MG tablet Take 1 tablet (12.5 mg total) by mouth daily.   levothyroxine (SYNTHROID) 137 MCG tablet Take 137 mcg by mouth daily before breakfast.   lisinopril (ZESTRIL) 10 MG tablet TAKE 1 TABLET BY MOUTH TWICE DAILY   predniSONE (DELTASONE) 10 MG tablet Take one tab 3 x day for 3 days, then take one tab 2 x a day for 3 days and then take one tab a day for 3 days for copd   rosuvastatin (CRESTOR) 5 MG tablet Take 1 tablet (5 mg total) by mouth daily.   Sennosides-Docusate Sodium 8.6-50 MG  CAPS Take by mouth.   [DISCONTINUED] valACYclovir (VALTREX) 1000 MG tablet Take 1 tablet (1,000 mg total) by mouth 3 (three) times daily for 10 days.   valACYclovir (VALTREX) 1000 MG tablet Take 1 tablet (1,000 mg total) by mouth 3 (three) times daily for 7 days.   No facility-administered encounter medications on file as of 09/17/2021.      Medical History: Past Medical History:  Diagnosis Date   Chronic cough    History of COVID-19    04/2021   Hypertension    Hypothyroidism    Mild cardiomegaly    Sinus trouble    Sleep apnea    no CPAP     Vital Signs: BP (!) 147/86 Comment: 146/90  Pulse 64   Temp 98.3 F (36.8 C)   Resp 16   Ht '5\' 1"'$  (1.549 m)   Wt 152 lb 3.2 oz (69 kg)   SpO2 99%   BMI 28.76 kg/m    Review of Systems  Constitutional:  Negative for fatigue and fever.  HENT:  Negative for congestion, mouth sores and postnasal drip.   Respiratory:  Negative for cough.   Cardiovascular:  Negative for chest pain.  Genitourinary:  Negative for flank pain.  Skin:  Positive for rash.       Bumps and stinging along back  of neck and scalp  Psychiatric/Behavioral: Negative.     Physical Exam Vitals and nursing note reviewed.  Constitutional:      Appearance: Normal appearance.  HENT:     Head: Normocephalic and atraumatic.     Right Ear: Tympanic membrane and ear canal normal.     Left Ear: Tympanic membrane and ear canal normal.  Eyes:     Extraocular Movements: Extraocular movements intact.     Conjunctiva/sclera: Conjunctivae normal.     Pupils: Pupils are equal, round, and reactive to light.  Cardiovascular:     Rate and Rhythm: Normal rate and regular rhythm.  Pulmonary:     Effort: Pulmonary effort is normal.     Breath sounds: Normal breath sounds. No wheezing.  Skin:    General: Skin is warm and dry.     Findings: Rash present.     Comments: Multiple small patches of vesicles along scalp in hair and top of neck. One small vesicle beneath right ear  lobe, none present in ear, small patch under right side of jaw as well.    Neurological:     General: No focal deficit present.     Mental Status: She is alert. Mental status is at baseline.  Psychiatric:        Mood and Affect: Mood normal.        Behavior: Behavior normal.        Thought Content: Thought content normal.        Judgment: Judgment normal.      Assessment/Plan: 1. Herpes zoster without complication We will go ahead and start on Valtrex 3 times daily as well as start prednisone taper.  Patient also be given gabapentin and may start with 100 mg 3 times a day but may increase to 300 mg 3 times a day if needed for pain.  Patient advised to contact office if any new or worsening symptoms or to go to emergency room - valACYclovir (VALTREX) 1000 MG tablet; Take 1 tablet (1,000 mg total) by mouth 3 (three) times daily for 7 days.  Dispense: 21 tablet; Refill: 0 - gabapentin (NEURONTIN) 100 MG capsule; Take 1 capsule (100 mg total) by mouth 3 (three) times daily.  Dispense: 90 capsule; Refill: 1 - predniSONE (DELTASONE) 10 MG tablet; Take one tab 3 x day for 3 days, then take one tab 2 x a day for 3 days and then take one tab a day for 3 days for copd  Dispense: 18 tablet; Refill: 0   General Counseling: Aunesti verbalizes understanding of the findings of todays visit and agrees with plan of treatment. I have discussed any further diagnostic evaluation that may be needed or ordered today. We also reviewed her medications today. she has been encouraged to call the office with any questions or concerns that should arise related to todays visit.    Counseling:    No orders of the defined types were placed in this encounter.   Meds ordered this encounter  Medications   DISCONTD: valACYclovir (VALTREX) 1000 MG tablet    Sig: Take 1 tablet (1,000 mg total) by mouth 3 (three) times daily for 10 days.    Dispense:  21 tablet    Refill:  0   gabapentin (NEURONTIN) 100 MG capsule     Sig: Take 1 capsule (100 mg total) by mouth 3 (three) times daily.    Dispense:  90 capsule    Refill:  1   predniSONE (DELTASONE) 10 MG tablet  Sig: Take one tab 3 x day for 3 days, then take one tab 2 x a day for 3 days and then take one tab a day for 3 days for copd    Dispense:  18 tablet    Refill:  0   valACYclovir (VALTREX) 1000 MG tablet    Sig: Take 1 tablet (1,000 mg total) by mouth 3 (three) times daily for 7 days.    Dispense:  21 tablet    Refill:  0    Time spent:30 Minutes

## 2021-09-29 ENCOUNTER — Other Ambulatory Visit: Payer: Self-pay | Admitting: Physician Assistant

## 2021-09-29 DIAGNOSIS — I10 Essential (primary) hypertension: Secondary | ICD-10-CM

## 2021-10-12 DIAGNOSIS — Z85850 Personal history of malignant neoplasm of thyroid: Secondary | ICD-10-CM | POA: Diagnosis not present

## 2021-10-12 DIAGNOSIS — E890 Postprocedural hypothyroidism: Secondary | ICD-10-CM | POA: Diagnosis not present

## 2021-10-18 DIAGNOSIS — E890 Postprocedural hypothyroidism: Secondary | ICD-10-CM | POA: Diagnosis not present

## 2021-10-18 DIAGNOSIS — F17210 Nicotine dependence, cigarettes, uncomplicated: Secondary | ICD-10-CM | POA: Diagnosis not present

## 2021-10-18 DIAGNOSIS — Z85850 Personal history of malignant neoplasm of thyroid: Secondary | ICD-10-CM | POA: Diagnosis not present

## 2021-11-03 DIAGNOSIS — L723 Sebaceous cyst: Secondary | ICD-10-CM | POA: Diagnosis not present

## 2021-11-03 DIAGNOSIS — Z85850 Personal history of malignant neoplasm of thyroid: Secondary | ICD-10-CM | POA: Diagnosis not present

## 2021-11-16 ENCOUNTER — Other Ambulatory Visit: Payer: Self-pay | Admitting: Physician Assistant

## 2021-11-16 DIAGNOSIS — I10 Essential (primary) hypertension: Secondary | ICD-10-CM

## 2021-11-30 ENCOUNTER — Other Ambulatory Visit: Payer: Self-pay

## 2021-11-30 NOTE — Patient Outreach (Signed)
[facility]) Care Management  11/30/2021  Hailey Wong [birth year] [id number]   Telephone Screen    Outreach call to patient to introduce [facility] services and assess care needs as part of benefit of PCP office and insurance plan. No answer. RN CM left HIPAA compliant voicemail message along with contact info.      Plan: RN CM will make outreach attempt to patient within 4 business days. RN CM will send unsuccessful outreach letter to patient.   Enzo Montgomery, RN,BSN,CCM [place] Management Telephonic Care Management Coordinator Direct Phone: [phone] Toll Free: [phone] Fax: [phone]

## 2021-12-02 ENCOUNTER — Other Ambulatory Visit: Payer: Self-pay

## 2021-12-02 NOTE — Patient Outreach (Signed)
[facility]) Care Management  12/02/2021  Hailey Wong June 14, 1945 [id number]   Telephone Screen       Outreach call to patient to introduce [facility] services and assess care needs as part of benefit of PCP office and insurance plan. Spoke with patient.    Main healthcare issue/concern today: None reported. Patient states things are going well. She has no issues with meds or transportation. She manages BP daily in the home and BP controlled. No RN CM needs identified at this time.   Health Maintenance/Care Gaps: -Last AWV: 12/24/2019. Patient already has an appt scheduled for 01/06/22.  Plan: RN CM will send [facility] info for future reference for patient.   Enzo Montgomery, RN,BSN,CCM [facility] Management Telephonic Care Management Coordinator Direct Phone: [phone] Toll Free: [phone] Fax: [phone]

## 2021-12-30 DIAGNOSIS — R5383 Other fatigue: Secondary | ICD-10-CM | POA: Diagnosis not present

## 2021-12-30 DIAGNOSIS — E782 Mixed hyperlipidemia: Secondary | ICD-10-CM | POA: Diagnosis not present

## 2021-12-30 DIAGNOSIS — I700 Atherosclerosis of aorta: Secondary | ICD-10-CM | POA: Diagnosis not present

## 2021-12-30 DIAGNOSIS — E538 Deficiency of other specified B group vitamins: Secondary | ICD-10-CM | POA: Diagnosis not present

## 2021-12-30 DIAGNOSIS — E559 Vitamin D deficiency, unspecified: Secondary | ICD-10-CM | POA: Diagnosis not present

## 2021-12-31 LAB — CBC WITH DIFFERENTIAL/PLATELET
Basophils Absolute: 0.1 10*3/uL (ref 0.0–0.2)
Basos: 1 %
EOS (ABSOLUTE): 0.2 10*3/uL (ref 0.0–0.4)
Eos: 3 %
Hematocrit: 38.7 % (ref 34.0–46.6)
Hemoglobin: 13.4 g/dL (ref 11.1–15.9)
Immature Grans (Abs): 0 10*3/uL (ref 0.0–0.1)
Immature Granulocytes: 0 %
Lymphocytes Absolute: 2.5 10*3/uL (ref 0.7–3.1)
Lymphs: 34 %
MCH: 29.9 pg (ref 26.6–33.0)
MCHC: 34.6 g/dL (ref 31.5–35.7)
MCV: 86 fL (ref 79–97)
Monocytes Absolute: 0.7 10*3/uL (ref 0.1–0.9)
Monocytes: 9 %
Neutrophils Absolute: 3.8 10*3/uL (ref 1.4–7.0)
Neutrophils: 53 %
Platelets: 289 10*3/uL (ref 150–450)
RBC: 4.48 x10E6/uL (ref 3.77–5.28)
RDW: 12.7 % (ref 11.7–15.4)
WBC: 7.2 10*3/uL (ref 3.4–10.8)

## 2021-12-31 LAB — COMPREHENSIVE METABOLIC PANEL
ALT: 10 IU/L (ref 0–32)
AST: 15 IU/L (ref 0–40)
Albumin/Globulin Ratio: 2.3 — ABNORMAL HIGH (ref 1.2–2.2)
Albumin: 4.2 g/dL (ref 3.8–4.8)
Alkaline Phosphatase: 78 IU/L (ref 44–121)
BUN/Creatinine Ratio: 18 (ref 12–28)
BUN: 10 mg/dL (ref 8–27)
Bilirubin Total: 0.5 mg/dL (ref 0.0–1.2)
CO2: 21 mmol/L (ref 20–29)
Calcium: 9.2 mg/dL (ref 8.7–10.3)
Chloride: 95 mmol/L — ABNORMAL LOW (ref 96–106)
Creatinine, Ser: 0.57 mg/dL (ref 0.57–1.00)
Globulin, Total: 1.8 g/dL (ref 1.5–4.5)
Glucose: 88 mg/dL (ref 70–99)
Potassium: 4.2 mmol/L (ref 3.5–5.2)
Sodium: 133 mmol/L — ABNORMAL LOW (ref 134–144)
Total Protein: 6 g/dL (ref 6.0–8.5)
eGFR: 94 mL/min/{1.73_m2} (ref >59)

## 2021-12-31 LAB — LIPID PANEL WITH LDL/HDL RATIO
Cholesterol, Total: 163 mg/dL (ref 100–199)
HDL: 96 mg/dL (ref >39)
LDL Chol Calc (NIH): 57 mg/dL (ref 0–99)
LDL/HDL Ratio: 0.6 ratio (ref 0.0–3.2)
Triglycerides: 47 mg/dL (ref 0–149)
VLDL Cholesterol Cal: 10 mg/dL (ref 5–40)

## 2021-12-31 LAB — B12 AND FOLATE PANEL
Folate: 10.5 ng/mL (ref >3.0)
Vitamin B-12: 223 pg/mL — ABNORMAL LOW (ref 232–1245)

## 2021-12-31 LAB — VITAMIN D 25 HYDROXY (VIT D DEFICIENCY, FRACTURES): Vit D, 25-Hydroxy: 40.2 ng/mL (ref 30.0–100.0)

## 2022-01-06 ENCOUNTER — Ambulatory Visit: Payer: Medicare PPO | Admitting: Internal Medicine

## 2022-01-18 ENCOUNTER — Encounter: Payer: Self-pay | Admitting: Internal Medicine

## 2022-01-18 ENCOUNTER — Ambulatory Visit (INDEPENDENT_AMBULATORY_CARE_PROVIDER_SITE_OTHER): Payer: Medicare PPO | Admitting: Internal Medicine

## 2022-01-18 VITALS — BP 139/75 | HR 74 | Temp 98.4°F | Resp 16 | Ht 61.0 in | Wt 152.0 lb

## 2022-01-18 DIAGNOSIS — I10 Essential (primary) hypertension: Secondary | ICD-10-CM

## 2022-01-18 DIAGNOSIS — E538 Deficiency of other specified B group vitamins: Secondary | ICD-10-CM | POA: Diagnosis not present

## 2022-01-18 DIAGNOSIS — Z0001 Encounter for general adult medical examination with abnormal findings: Secondary | ICD-10-CM

## 2022-01-18 DIAGNOSIS — M810 Age-related osteoporosis without current pathological fracture: Secondary | ICD-10-CM | POA: Diagnosis not present

## 2022-01-18 DIAGNOSIS — R300 Dysuria: Secondary | ICD-10-CM | POA: Diagnosis not present

## 2022-01-18 DIAGNOSIS — E782 Mixed hyperlipidemia: Secondary | ICD-10-CM

## 2022-01-18 DIAGNOSIS — R911 Solitary pulmonary nodule: Secondary | ICD-10-CM

## 2022-01-18 DIAGNOSIS — Z1231 Encounter for screening mammogram for malignant neoplasm of breast: Secondary | ICD-10-CM | POA: Diagnosis not present

## 2022-01-18 DIAGNOSIS — F17210 Nicotine dependence, cigarettes, uncomplicated: Secondary | ICD-10-CM

## 2022-01-18 MED ORDER — IBANDRONATE SODIUM 150 MG PO TABS: ORAL_TABLET | ORAL | 3 refills | Status: DC

## 2022-01-18 NOTE — Progress Notes (Signed)
[facility] [place], [place] [address]  Internal MEDICINE  Office Visit Note  Patient Name: Hailey Wong  [id number]  [id number]  Date of Service: 01/18/2022  Chief Complaint  Patient presents with   Medicare Wellness   Hypertension     HPI Pt is here for routine health maintenance examination Pt does not have any major complaints BMD is discussed today, will like to start therapy She is not interested in smoking cessation Will need follow up CT lung  Will need mammogram B12 is low  Current Medication: Outpatient Encounter Medications as of 01/18/2022  Medication Sig   ASPIRIN 81 PO Take by mouth.   calcium carbonate (OS-CAL) 600 MG TABS tablet Take 600 mg by mouth 2 (two) times daily with a meal.   Cholecalciferol (D3 ADULT PO) Take by mouth.   doxycycline (VIBRA-TABS) 100 MG tablet Take 1 tablet (100 mg total) by mouth 2 (two) times daily.   fluticasone (FLONASE) 50 MCG/ACT nasal spray Place 1 spray into both nostrils daily as needed for allergies or rhinitis.   gabapentin (NEURONTIN) 100 MG capsule Take 1 capsule (100 mg total) by mouth 3 (three) times daily.   hydrochlorothiazide (HYDRODIURIL) 12.5 MG tablet TAKE 1 TABLET BY MOUTH ONCE DAILY   ibandronate (BONIVA) 150 MG tablet Take in the morning with a full glass of water, on an empty stomach, and do not take anything else by mouth or lie down for the next 30 min.   levothyroxine (SYNTHROID) 137 MCG tablet Take 137 mcg by mouth daily before breakfast.   lisinopril (ZESTRIL) 10 MG tablet TAKE 1 TABLET BY MOUTH TWICE DAILY   predniSONE (DELTASONE) 10 MG tablet Take one tab 3 x day for 3 days, then take one tab 2 x a day for 3 days and then take one tab a day for 3 days for copd   rosuvastatin (CRESTOR) 5 MG tablet Take 1 tablet (5 mg total) by mouth daily.   Sennosides-Docusate Sodium 8.6-50 MG CAPS Take by mouth.   No facility-administered encounter medications on file as of 01/18/2022.     Surgical History: Past Surgical History:  Procedure Laterality Date   BROW LIFT Bilateral 12/13/2019   Procedure: BLEPHAROPLASTY UPPER EYELID; W/EXCESS SKIN BLEPHAROPTOSIS REPAIR; RESECT EX;  Surgeon: Karle Starch, MD;  Location: [place];  Service: Ophthalmology;  Laterality: Bilateral;   CATARACT EXTRACTION W/PHACO Right 07/21/2021   Procedure: CATARACT EXTRACTION PHACO AND INTRAOCULAR LENS PLACEMENT ([place]) RIGHT 12.60 01:30.4;  Surgeon: Leandrew Koyanagi, MD;  Location: [place];  Service: Ophthalmology;  Laterality: Right;   CATARACT EXTRACTION W/PHACO Left 08/04/2021   Procedure: CATARACT EXTRACTION PHACO AND INTRAOCULAR LENS PLACEMENT ([facility]) LEFT 6.26 01:02.6;  Surgeon: Leandrew Koyanagi, MD;  Location: [place];  Service: Ophthalmology;  Laterality: Left;   COLONOSCOPY WITH PROPOFOL N/A 03/02/2021   Procedure: COLONOSCOPY WITH PROPOFOL;  Surgeon: Jonathon Bellows, MD;  Location: [facility];  Service: Gastroenterology;  Laterality: N/A;   right ankle surgery  1997   THYROIDECTOMY Bilateral 10/20/2020   Procedure: THYROIDECTOMY;  Surgeon: Beverly Gust, MD;  Location: [facility];  Service: ENT;  Laterality: Bilateral;   TUBAL LIGATION  1977    Medical History: Past Medical History:  Diagnosis Date   Chronic cough    History of COVID-19    04/2021   Hypertension    Hypothyroidism    Mild cardiomegaly    Sinus trouble    Sleep apnea    no CPAP  Family History: Family History  Problem Relation Age of Onset   Hypertension Mother    Pulmonary fibrosis Father    Breast cancer Sister     Social History: Social History   Socioeconomic History   Marital status: Married    Spouse name: Not on file   Number of children: Not on file   Years of education: Not on file   Highest education level: Not on file  Occupational History   Not on file  Tobacco Use   Smoking status: Every Day    Packs/day: 1.00    Years: 50.00    Total pack  years: 50.00    Types: Cigarettes   Smokeless tobacco: Never   Tobacco comments:    1 pack and a half daily  Vaping Use   Vaping Use: Never used  Substance and Sexual Activity   Alcohol use: Yes    Alcohol/week: 7.0 standard drinks of alcohol    Types: 7 Glasses of wine per week    Comment: wine daily   Drug use: Never   Sexual activity: Not on file  Other Topics Concern   Not on file  Social History Narrative   Not on file   Social Determinants of Health   Financial Resource Strain: Not on file  Food Insecurity: No Food Insecurity (12/02/2021)   Hunger Vital Sign    Worried About Running Out of Food in the Last Year: Never true    Ran Out of Food in the Last Year: Never true  Transportation Needs: No Transportation Needs (12/02/2021)   PRAPARE - Hydrologist (Medical): No    Lack of Transportation (Non-Medical): No  Physical Activity: Not on file  Stress: Not on file  Social Connections: Not on file      Review of Systems  Constitutional:  Negative for chills, fatigue, fever and unexpected weight change.  HENT:  Negative for congestion, mouth sores, postnasal drip, rhinorrhea, sneezing and sore throat.   Eyes:  Negative for redness.  Respiratory:  Negative for cough, chest tightness and shortness of breath.   Cardiovascular:  Negative for chest pain and palpitations.  Gastrointestinal:  Negative for abdominal pain, constipation, diarrhea, nausea and vomiting.  Genitourinary:  Negative for dysuria, flank pain and frequency.  Musculoskeletal:  Negative for arthralgias, back pain, joint swelling and neck pain.  Skin:  Negative for rash.  Neurological: Negative.  Negative for tremors and numbness.  Hematological:  Negative for adenopathy. Does not bruise/bleed easily.  Psychiatric/Behavioral: Negative.  Negative for behavioral problems (Depression), sleep disturbance and suicidal ideas. The patient is not nervous/anxious.      Vital  Signs: BP 139/75   Pulse 74   Temp 98.4 F (36.9 C)   Resp 16   Ht 5' 1"  (1.549 m)   Wt 152 lb (68.9 kg)   SpO2 97%   BMI 28.72 kg/m    Physical Exam Constitutional:      General: She is not in acute distress.    Appearance: Normal appearance. She is well-developed. She is not diaphoretic.  HENT:     Head: Normocephalic and atraumatic.     Right Ear: External ear normal.     Left Ear: External ear normal.     Nose: Nose normal.     Mouth/Throat:     Mouth: Mucous membranes are moist.     Pharynx: No oropharyngeal exudate or posterior oropharyngeal erythema.  Eyes:     General: No scleral icterus.  Right eye: No discharge.        Left eye: No discharge.     Extraocular Movements: Extraocular movements intact.     Conjunctiva/sclera: Conjunctivae normal.     Pupils: Pupils are equal, round, and reactive to light.  Neck:     Thyroid: No thyromegaly.     Vascular: No JVD.     Trachea: No tracheal deviation.  Cardiovascular:     Rate and Rhythm: Normal rate and regular rhythm.     Pulses: Normal pulses.     Heart sounds: Normal heart sounds. No murmur heard.    No friction rub. No gallop.  Pulmonary:     Effort: Pulmonary effort is normal. No respiratory distress.     Breath sounds: Normal breath sounds. No stridor. No wheezing or rales.  Chest:     Chest wall: No tenderness.  Abdominal:     General: Bowel sounds are normal. There is no distension.     Palpations: Abdomen is soft. There is no mass.     Tenderness: There is no abdominal tenderness. There is no guarding or rebound.  Musculoskeletal:        General: No tenderness or deformity. Normal range of motion.     Cervical back: Normal range of motion and neck supple.  Lymphadenopathy:     Cervical: No cervical adenopathy.  Skin:    General: Skin is warm and dry.     Coloration: Skin is not pale.     Findings: No erythema or rash.  Neurological:     General: No focal deficit present.     Mental Status:  She is alert.     Cranial Nerves: No cranial nerve deficit.     Motor: No abnormal muscle tone.     Coordination: Coordination normal.     Deep Tendon Reflexes: Reflexes are normal and symmetric.  Psychiatric:        Mood and Affect: Mood normal.        Behavior: Behavior normal.        Thought Content: Thought content normal.        Judgment: Judgment normal.      LABS: Recent Results (from the past 2160 hour(s))  CBC w/Diff/Platelet     Status: None   Collection Time: 12/30/21 11:21 AM  Result Value Ref Range   WBC 7.2 3.4 - 10.8 x10E3/uL   RBC 4.48 3.77 - 5.28 x10E6/uL   Hemoglobin 13.4 11.1 - 15.9 g/dL   Hematocrit 38.7 34.0 - 46.6 %   MCV 86 79 - 97 fL   MCH 29.9 26.6 - 33.0 pg   MCHC 34.6 31.5 - 35.7 g/dL   RDW 12.7 11.7 - 15.4 %   Platelets 289 150 - 450 x10E3/uL   Neutrophils 53 Not Estab. %   Lymphs 34 Not Estab. %   Monocytes 9 Not Estab. %   Eos 3 Not Estab. %   Basos 1 Not Estab. %   Neutrophils Absolute 3.8 1.4 - 7.0 x10E3/uL   Lymphocytes Absolute 2.5 0.7 - 3.1 x10E3/uL   Monocytes Absolute 0.7 0.1 - 0.9 x10E3/uL   EOS (ABSOLUTE) 0.2 0.0 - 0.4 x10E3/uL   Basophils Absolute 0.1 0.0 - 0.2 x10E3/uL   Immature Granulocytes 0 Not Estab. %   Immature Grans (Abs) 0.0 0.0 - 0.1 x10E3/uL  Comprehensive metabolic panel     Status: Abnormal   Collection Time: 12/30/21 11:21 AM  Result Value Ref Range   Glucose 88 70 - 99 mg/dL  BUN 10 8 - 27 mg/dL   Creatinine, Ser 0.57 0.57 - 1.00 mg/dL   eGFR 94 >59 mL/min/1.73   BUN/Creatinine Ratio 18 12 - 28   Sodium 133 (L) 134 - 144 mmol/L   Potassium 4.2 3.5 - 5.2 mmol/L   Chloride 95 (L) 96 - 106 mmol/L   CO2 21 20 - 29 mmol/L   Calcium 9.2 8.7 - 10.3 mg/dL   Total Protein 6.0 6.0 - 8.5 g/dL   Albumin 4.2 3.8 - 4.8 g/dL   Globulin, Total 1.8 1.5 - 4.5 g/dL   Albumin/Globulin Ratio 2.3 (H) 1.2 - 2.2   Bilirubin Total 0.5 0.0 - 1.2 mg/dL   Alkaline Phosphatase 78 44 - 121 IU/L   AST 15 0 - 40 IU/L   ALT 10 0 - 32  IU/L  Lipid Panel With LDL/HDL Ratio     Status: None   Collection Time: 12/30/21 11:21 AM  Result Value Ref Range   Cholesterol, Total 163 100 - 199 mg/dL   Triglycerides 47 0 - 149 mg/dL   HDL 96 >39 mg/dL   VLDL Cholesterol Cal 10 5 - 40 mg/dL   LDL Chol Calc (NIH) 57 0 - 99 mg/dL   LDL/HDL Ratio 0.6 0.0 - 3.2 ratio    Comment:                                     LDL/HDL Ratio                                             Men  Women                               1/2 Avg.Risk  1.0    1.5                                   Avg.Risk  3.6    3.2                                2X Avg.Risk  6.2    5.0                                3X Avg.Risk  8.0    6.1   VITAMIN D 25 Hydroxy (Vit-D Deficiency, Fractures)     Status: None   Collection Time: 12/30/21 11:21 AM  Result Value Ref Range   Vit D, 25-Hydroxy 40.2 30.0 - 100.0 ng/mL    Comment: Vitamin D deficiency has been defined by the [place] practice guideline as a level of serum 25-OH vitamin D less than 20 ng/mL (1,2). The Endocrine Society went on to further define vitamin D insufficiency as a level between 21 and 29 ng/mL (2). 1. IOM (Institute of Medicine). 2010. Dietary reference    intakes for calcium and D. [place]: The    [employer]. 2. Holick MF, Binkley [place], Bischoff-Ferrari HA, et al.    Evaluation, treatment, and prevention of vitamin D  deficiency: an Endocrine Society clinical practice    guideline. JCEM. 2011 Jul; 96(7):1911-30.   B12 and Folate Panel     Status: Abnormal   Collection Time: 12/30/21 11:21 AM  Result Value Ref Range   Vitamin B-12 223 (L) 232 - 1,245 pg/mL   Folate 10.5 >3.0 ng/mL    Comment: A serum folate concentration of less than 3.1 ng/mL is considered to represent clinical deficiency.      Assessment/Plan: 1. Encounter for routine adult health examination with abnormal findings All pHM is updated today  2. Mixed  hyperlipidemia Continue Crestor as before   3. Benign hypertension Home blood pressure reading is normal, continue all meds as before   4. Age-related osteoporosis without current pathological fracture Will start Boniva 190m po q month   5. Lung nodule seen on imaging study Pt needs follow up study, continues to be high risk for lung cancer due to smoking  - CT Chest Wo Contrast; Future  6. Cigarette nicotine dependence without complication Encouraged smoking cessation   7. Visit for screening mammogram - MM DIGITAL SCREENING BILATERAL; Future  8. B12 deficiency Start Cynacobalamine 1000 mcg q month   9. Dysuria - UA/M w/rflx Culture, Routine   General Counseling: Saphire verbalizes understanding of the findings of todays visit and agrees with plan of treatment. I have discussed any further diagnostic evaluation that may be needed or ordered today. We also reviewed her medications today. she has been encouraged to call the office with any questions or concerns that should arise related to todays visit.  Smoking cessation counseling: Pt acknowledges the risks of long term smoking, she will try to quite smoking. Options for different medications including nicotine products, chewing gum, patch etc, Wellbutrin and Chantix is discussed Goal and date of compete cessation is discussed Total time spent in smoking cessation is 10 min.   Counseling:  [place] Controlled Substance Database was reviewed by me.  Orders Placed This Encounter  Procedures   CT Chest Wo Contrast   MM DIGITAL SCREENING BILATERAL   UA/M w/rflx Culture, Routine    Meds ordered this encounter  Medications   ibandronate (BONIVA) 150 MG tablet    Sig: Take in the morning with a full glass of water, on an empty stomach, and do not take anything else by mouth or lie down for the next 30 min.    Dispense:  3 tablet    Refill:  3    Total time spent:35 Minutes  Time spent includes review of chart, medications,  test results, and follow up plan with the patient.     FLavera Guise MD  Internal Medicine

## 2022-01-20 ENCOUNTER — Other Ambulatory Visit: Payer: Self-pay | Admitting: Internal Medicine

## 2022-01-20 ENCOUNTER — Telehealth: Payer: Self-pay | Admitting: Internal Medicine

## 2022-01-20 DIAGNOSIS — F17210 Nicotine dependence, cigarettes, uncomplicated: Secondary | ICD-10-CM

## 2022-01-20 NOTE — Telephone Encounter (Signed)
CT lung screening faxed to [facility] [phone]

## 2022-01-21 ENCOUNTER — Telehealth: Payer: Self-pay

## 2022-01-21 LAB — UA/M W/RFLX CULTURE, ROUTINE
Bilirubin, UA: NEGATIVE
Glucose, UA: NEGATIVE
Ketones, UA: NEGATIVE
Nitrite, UA: NEGATIVE
Protein,UA: NEGATIVE
RBC, UA: NEGATIVE
Specific Gravity, UA: 1.007 (ref 1.005–1.030)
Urobilinogen, Ur: 0.2 mg/dL (ref 0.2–1.0)
pH, UA: 7.5 (ref 5.0–7.5)

## 2022-01-21 LAB — MICROSCOPIC EXAMINATION
Bacteria, UA: NONE SEEN
Casts: NONE SEEN /lpf
Epithelial Cells (non renal): NONE SEEN /hpf (ref 0–10)
RBC, Urine: NONE SEEN /hpf (ref 0–2)
WBC, UA: NONE SEEN /hpf (ref 0–5)

## 2022-01-21 LAB — URINE CULTURE, REFLEX

## 2022-01-21 NOTE — Telephone Encounter (Signed)
Called pt and advised that she still needs to do a few B12 injections and also can take the oral b12 as well per DFK, pt was swithched to appt desk to schedule appt

## 2022-01-21 NOTE — Telephone Encounter (Signed)
-----   Message from Lavera Guise, MD sent at 01/20/2022 11:02 AM EDT ----- Her level is low, she should at least get few injections, can take oral as well  ----- Message ----- From: Edd Arbour, CMA Sent: 01/20/2022   9:53 AM EDT To: Lavera Guise, MD  Pt asked if she can do the B12 vitamin instead of injections.  ----- Message ----- From: Marshall Cork Sent: 01/20/2022   8:42 AM EDT To: Edd Arbour, CMA  She wants to know if she can just take B12 vitamin. She read that it is just effective.  ----- Message ----- From: Edd Arbour, CMA Sent: 01/19/2022   5:27 PM EDT To: Marshall Cork  Idk that's what DFK said she needs, so not sure.  Sorry  ----- Message ----- From: Marshall Cork Sent: 01/19/2022   4:12 PM EDT To: Lavera Guise, MD; Edd Arbour, CMA  Does she need to the 3 weeks in a row first? ----- Message ----- From: Lavera Guise, MD Sent: 01/18/2022   8:49 PM EDT To: Marshall Cork; Edd Arbour, CMA  Pt forgot to get b12 inj, she needs every month for next 6 months

## 2022-01-25 ENCOUNTER — Ambulatory Visit (INDEPENDENT_AMBULATORY_CARE_PROVIDER_SITE_OTHER): Payer: Medicare PPO

## 2022-01-25 DIAGNOSIS — E538 Deficiency of other specified B group vitamins: Secondary | ICD-10-CM | POA: Diagnosis not present

## 2022-01-25 MED ORDER — CYANOCOBALAMIN 1000 MCG/ML IJ SOLN
1000.0000 ug | Freq: Once | INTRAMUSCULAR | Status: AC
  Administered 2022-01-25: 1000 ug via INTRAMUSCULAR

## 2022-02-04 DIAGNOSIS — Z87891 Personal history of nicotine dependence: Secondary | ICD-10-CM | POA: Diagnosis not present

## 2022-02-09 ENCOUNTER — Telehealth: Payer: Self-pay | Admitting: Internal Medicine

## 2022-02-09 NOTE — Telephone Encounter (Signed)
Lvm to schedule follow up appointment to review CT results-Toni

## 2022-02-10 ENCOUNTER — Encounter: Payer: Self-pay | Admitting: Physician Assistant

## 2022-02-10 ENCOUNTER — Ambulatory Visit: Payer: Medicare PPO | Admitting: Physician Assistant

## 2022-02-10 DIAGNOSIS — J439 Emphysema, unspecified: Secondary | ICD-10-CM | POA: Diagnosis not present

## 2022-02-10 DIAGNOSIS — F17210 Nicotine dependence, cigarettes, uncomplicated: Secondary | ICD-10-CM | POA: Diagnosis not present

## 2022-02-10 NOTE — Progress Notes (Signed)
[facility] [place],  [address]  Internal MEDICINE  Office Visit Note  Patient Name: Hailey Wong  [id number]  [id number]  Date of Service: 02/10/2022  Chief Complaint  Patient presents with   Follow-up    CT   Hypertension    HPI Pt is here for routine follow up to review CT chest lung cancer screening -She does continue to smoke 1.5 ppd and is not interested in cessation at this time -Denies trouble with breathing. May get SOB with a lot of exertion, but normally no and does not have wheezing -CT 02/04/22: IMPRESSION: 1. There is no evidence of occult lung cancer. 2. Emphysema. 3. Scattered micronodules, benign by size criteria.  Recommendations: Lung-RADS 2: Benign - Nodules with a very low likelihood of becoming a clinically active cancer - Continue with annual low dose CT in 12 months.  -Did discuss checking PFT to further evaluate her breathing as she does have emphysema on her CT. This was noted on previous CT chest as well  Current Medication: Outpatient Encounter Medications as of 02/10/2022  Medication Sig   ASPIRIN 81 PO Take by mouth.   calcium carbonate (OS-CAL) 600 MG TABS tablet Take 600 mg by mouth 2 (two) times daily with a meal.   Cholecalciferol (D3 ADULT PO) Take by mouth.   fluticasone (FLONASE) 50 MCG/ACT nasal spray Place 1 spray into both nostrils daily as needed for allergies or rhinitis.   hydrochlorothiazide (HYDRODIURIL) 12.5 MG tablet TAKE 1 TABLET BY MOUTH ONCE DAILY   ibandronate (BONIVA) 150 MG tablet Take in the morning with a full glass of water, on an empty stomach, and do not take anything else by mouth or lie down for the next 30 min.   levothyroxine (SYNTHROID) 137 MCG tablet Take 137 mcg by mouth daily before breakfast.   lisinopril (ZESTRIL) 10 MG tablet TAKE 1 TABLET BY MOUTH TWICE DAILY   rosuvastatin (CRESTOR) 5 MG tablet Take 1 tablet (5 mg total) by mouth daily.   Sennosides-Docusate Sodium  8.6-50 MG CAPS Take by mouth.   [DISCONTINUED] doxycycline (VIBRA-TABS) 100 MG tablet Take 1 tablet (100 mg total) by mouth 2 (two) times daily.   [DISCONTINUED] predniSONE (DELTASONE) 10 MG tablet Take one tab 3 x day for 3 days, then take one tab 2 x a day for 3 days and then take one tab a day for 3 days for copd   [DISCONTINUED] gabapentin (NEURONTIN) 100 MG capsule Take 1 capsule (100 mg total) by mouth 3 (three) times daily. (Patient not taking: Reported on 02/10/2022)   No facility-administered encounter medications on file as of 02/10/2022.    Surgical History: Past Surgical History:  Procedure Laterality Date   BROW LIFT Bilateral 12/13/2019   Procedure: BLEPHAROPLASTY UPPER EYELID; W/EXCESS SKIN BLEPHAROPTOSIS REPAIR; RESECT EX;  Surgeon: Karle Starch, MD;  Location: [place];  Service: Ophthalmology;  Laterality: Bilateral;   CATARACT EXTRACTION W/PHACO Right 07/21/2021   Procedure: CATARACT EXTRACTION PHACO AND INTRAOCULAR LENS PLACEMENT ([place]) RIGHT 12.60 01:30.4;  Surgeon: Leandrew Koyanagi, MD;  Location: [place];  Service: Ophthalmology;  Laterality: Right;   CATARACT EXTRACTION W/PHACO Left 08/04/2021   Procedure: CATARACT EXTRACTION PHACO AND INTRAOCULAR LENS PLACEMENT ([facility]) LEFT 6.26 01:02.6;  Surgeon: Leandrew Koyanagi, MD;  Location: [place];  Service: Ophthalmology;  Laterality: Left;   COLONOSCOPY WITH PROPOFOL N/A 03/02/2021   Procedure: COLONOSCOPY WITH PROPOFOL;  Surgeon: Jonathon Bellows, MD;  Location: [facility];  Service: Gastroenterology;  Laterality: N/A;   right ankle surgery  1997   THYROIDECTOMY Bilateral 10/20/2020   Procedure: THYROIDECTOMY;  Surgeon: Beverly Gust, MD;  Location: [facility];  Service: ENT;  Laterality: Bilateral;   TUBAL LIGATION  1977    Medical History: Past Medical History:  Diagnosis Date   Chronic cough    History of COVID-19    04/2021   Hypertension    Hypothyroidism    Mild cardiomegaly     Sinus trouble    Sleep apnea    no CPAP    Family History: Family History  Problem Relation Age of Onset   Hypertension Mother    Pulmonary fibrosis Father    Breast cancer Sister     Social History   Socioeconomic History   Marital status: Married    Spouse name: Not on file   Number of children: Not on file   Years of education: Not on file   Highest education level: Not on file  Occupational History   Not on file  Tobacco Use   Smoking status: Every Day    Packs/day: 1.00    Years: 50.00    Total pack years: 50.00    Types: Cigarettes   Smokeless tobacco: Never   Tobacco comments:    1 pack and a half daily  Vaping Use   Vaping Use: Never used  Substance and Sexual Activity   Alcohol use: Yes    Alcohol/week: 7.0 standard drinks of alcohol    Types: 7 Glasses of wine per week    Comment: wine daily   Drug use: Never   Sexual activity: Not on file  Other Topics Concern   Not on file  Social History Narrative   Not on file   Social Determinants of Health   Financial Resource Strain: Not on file  Food Insecurity: No Food Insecurity (12/02/2021)   Hunger Vital Sign    Worried About Running Out of Food in the Last Year: Never true    Ran Out of Food in the Last Year: Never true  Transportation Needs: No Transportation Needs (12/02/2021)   PRAPARE - Hydrologist (Medical): No    Lack of Transportation (Non-Medical): No  Physical Activity: Not on file  Stress: Not on file  Social Connections: Not on file  Intimate Partner Violence: Not on file      Review of Systems  Constitutional:  Negative for chills, fatigue and unexpected weight change.  HENT:  Negative for congestion, postnasal drip, rhinorrhea, sneezing and sore throat.   Eyes:  Negative for redness.  Respiratory:  Negative for cough, chest tightness, shortness of breath and wheezing.   Cardiovascular:  Negative for chest pain and palpitations.  Gastrointestinal:   Negative for abdominal pain, constipation, diarrhea, nausea and vomiting.  Genitourinary:  Negative for dysuria and frequency.  Musculoskeletal:  Negative for arthralgias, back pain, joint swelling and neck pain.  Skin:  Negative for rash.  Neurological: Negative.  Negative for tremors and numbness.  Hematological:  Negative for adenopathy. Does not bruise/bleed easily.  Psychiatric/Behavioral:  Negative for behavioral problems (Depression), sleep disturbance and suicidal ideas. The patient is not nervous/anxious.     Vital Signs: BP 138/84   Pulse 69   Temp 97.8 F (36.6 C)   Resp 16   Ht '5\' 1"'$  (1.549 m)   Wt 152 lb (68.9 kg)   SpO2 99%   BMI 28.72 kg/m    Physical Exam Vitals and nursing note reviewed.  Constitutional:      Appearance: Normal appearance.  HENT:     Head: Normocephalic and atraumatic.  Eyes:     Pupils: Pupils are equal, round, and reactive to light.  Cardiovascular:     Rate and Rhythm: Normal rate and regular rhythm.  Pulmonary:     Effort: Pulmonary effort is normal.     Breath sounds: Normal breath sounds. No wheezing.  Musculoskeletal:        General: Normal range of motion.  Skin:    General: Skin is warm and dry.  Neurological:     General: No focal deficit present.     Mental Status: She is alert and oriented to person, place, and time.  Psychiatric:        Mood and Affect: Mood normal.        Behavior: Behavior normal.        Thought Content: Thought content normal.        Judgment: Judgment normal.        Assessment/Plan: 1. Pulmonary emphysema, unspecified emphysema type ([place]) Will order PFT for further evaluation - Pulmonary Function Test; Future  2. Cigarette nicotine dependence without complication Not ready to discuss cessation - Pulmonary Function Test; Future   General Counseling: Waniya verbalizes understanding of the findings of todays visit and agrees with plan of treatment. I have discussed any further diagnostic  evaluation that may be needed or ordered today. We also reviewed her medications today. she has been encouraged to call the office with any questions or concerns that should arise related to todays visit.    Orders Placed This Encounter  Procedures   Pulmonary Function Test    No orders of the defined types were placed in this encounter.   This patient was seen by Drema Dallas, PA-C in collaboration with Dr. Clayborn Bigness as a part of collaborative care agreement.   Total time spent:30 Minutes Time spent includes review of chart, medications, test results, and follow up plan with the patient.      Dr Lavera Guise Internal medicine

## 2022-02-18 ENCOUNTER — Other Ambulatory Visit: Payer: Self-pay | Admitting: Physician Assistant

## 2022-02-18 DIAGNOSIS — I700 Atherosclerosis of aorta: Secondary | ICD-10-CM

## 2022-02-18 DIAGNOSIS — I6523 Occlusion and stenosis of bilateral carotid arteries: Secondary | ICD-10-CM

## 2022-03-01 ENCOUNTER — Ambulatory Visit (INDEPENDENT_AMBULATORY_CARE_PROVIDER_SITE_OTHER): Payer: Medicare PPO

## 2022-03-01 DIAGNOSIS — E538 Deficiency of other specified B group vitamins: Secondary | ICD-10-CM

## 2022-03-01 MED ORDER — CYANOCOBALAMIN 1000 MCG/ML IJ SOLN
1000.0000 ug | Freq: Once | INTRAMUSCULAR | Status: AC
  Administered 2022-03-01: 1000 ug via INTRAMUSCULAR

## 2022-03-08 DIAGNOSIS — Z961 Presence of intraocular lens: Secondary | ICD-10-CM | POA: Diagnosis not present

## 2022-03-22 ENCOUNTER — Ambulatory Visit
Admission: RE | Admit: 2022-03-22 | Discharge: 2022-03-22 | Disposition: A | Discharge status: Home / Self Care | Payer: Medicare PPO | Source: Ambulatory Visit | Attending: Internal Medicine | Admitting: Internal Medicine

## 2022-03-22 DIAGNOSIS — M810 Age-related osteoporosis without current pathological fracture: Secondary | ICD-10-CM | POA: Insufficient documentation

## 2022-03-22 DIAGNOSIS — E538 Deficiency of other specified B group vitamins: Secondary | ICD-10-CM | POA: Insufficient documentation

## 2022-03-22 DIAGNOSIS — R300 Dysuria: Secondary | ICD-10-CM | POA: Diagnosis not present

## 2022-03-22 DIAGNOSIS — I10 Essential (primary) hypertension: Secondary | ICD-10-CM | POA: Insufficient documentation

## 2022-03-22 DIAGNOSIS — E782 Mixed hyperlipidemia: Secondary | ICD-10-CM | POA: Diagnosis not present

## 2022-03-22 DIAGNOSIS — Z1231 Encounter for screening mammogram for malignant neoplasm of breast: Secondary | ICD-10-CM | POA: Diagnosis not present

## 2022-03-22 DIAGNOSIS — Z0001 Encounter for general adult medical examination with abnormal findings: Secondary | ICD-10-CM

## 2022-03-22 DIAGNOSIS — F17210 Nicotine dependence, cigarettes, uncomplicated: Secondary | ICD-10-CM | POA: Diagnosis not present

## 2022-03-28 ENCOUNTER — Other Ambulatory Visit: Payer: Self-pay | Admitting: Physician Assistant

## 2022-03-28 DIAGNOSIS — I10 Essential (primary) hypertension: Secondary | ICD-10-CM

## 2022-03-29 ENCOUNTER — Ambulatory Visit: Payer: Medicare PPO

## 2022-03-30 ENCOUNTER — Ambulatory Visit (INDEPENDENT_AMBULATORY_CARE_PROVIDER_SITE_OTHER): Payer: Medicare PPO

## 2022-03-30 DIAGNOSIS — E538 Deficiency of other specified B group vitamins: Secondary | ICD-10-CM | POA: Diagnosis not present

## 2022-03-30 MED ORDER — CYANOCOBALAMIN 1000 MCG/ML IJ SOLN
1000.0000 ug | Freq: Once | INTRAMUSCULAR | Status: AC
  Administered 2022-03-30: 1000 ug via INTRAMUSCULAR

## 2022-04-04 DIAGNOSIS — E890 Postprocedural hypothyroidism: Secondary | ICD-10-CM | POA: Diagnosis not present

## 2022-04-05 DIAGNOSIS — Z85850 Personal history of malignant neoplasm of thyroid: Secondary | ICD-10-CM | POA: Diagnosis not present

## 2022-04-05 DIAGNOSIS — E890 Postprocedural hypothyroidism: Secondary | ICD-10-CM | POA: Diagnosis not present

## 2022-04-05 DIAGNOSIS — F17210 Nicotine dependence, cigarettes, uncomplicated: Secondary | ICD-10-CM | POA: Diagnosis not present

## 2022-04-15 ENCOUNTER — Inpatient Hospital Stay
Admission: EM | Admit: 2022-04-15 | Discharge: 2022-04-20 | DRG: 522 | Disposition: A | Discharge status: Home Health | Payer: Medicare PPO | Source: Ambulatory Visit | Attending: Internal Medicine | Admitting: Internal Medicine

## 2022-04-15 ENCOUNTER — Encounter: Payer: Self-pay | Admitting: Emergency Medicine

## 2022-04-15 ENCOUNTER — Emergency Department: Payer: Medicare PPO

## 2022-04-15 ENCOUNTER — Other Ambulatory Visit: Payer: Self-pay

## 2022-04-15 DIAGNOSIS — Z471 Aftercare following joint replacement surgery: Secondary | ICD-10-CM | POA: Diagnosis not present

## 2022-04-15 DIAGNOSIS — S72001A Fracture of unspecified part of neck of right femur, initial encounter for closed fracture: Secondary | ICD-10-CM | POA: Diagnosis not present

## 2022-04-15 DIAGNOSIS — S0512XA Contusion of eyeball and orbital tissues, left eye, initial encounter: Secondary | ICD-10-CM | POA: Diagnosis not present

## 2022-04-15 DIAGNOSIS — S72009A Fracture of unspecified part of neck of unspecified femur, initial encounter for closed fracture: Secondary | ICD-10-CM | POA: Diagnosis present

## 2022-04-15 DIAGNOSIS — G4730 Sleep apnea, unspecified: Secondary | ICD-10-CM | POA: Diagnosis not present

## 2022-04-15 DIAGNOSIS — Z7983 Long term (current) use of bisphosphonates: Secondary | ICD-10-CM

## 2022-04-15 DIAGNOSIS — Z8616 Personal history of COVID-19: Secondary | ICD-10-CM

## 2022-04-15 DIAGNOSIS — Z8249 Family history of ischemic heart disease and other diseases of the circulatory system: Secondary | ICD-10-CM

## 2022-04-15 DIAGNOSIS — F17210 Nicotine dependence, cigarettes, uncomplicated: Secondary | ICD-10-CM | POA: Diagnosis present

## 2022-04-15 DIAGNOSIS — Y92018 Other place in single-family (private) house as the place of occurrence of the external cause: Secondary | ICD-10-CM

## 2022-04-15 DIAGNOSIS — S0012XA Contusion of left eyelid and periocular area, initial encounter: Secondary | ICD-10-CM | POA: Diagnosis present

## 2022-04-15 DIAGNOSIS — E871 Hypo-osmolality and hyponatremia: Secondary | ICD-10-CM | POA: Diagnosis present

## 2022-04-15 DIAGNOSIS — F17200 Nicotine dependence, unspecified, uncomplicated: Secondary | ICD-10-CM | POA: Diagnosis not present

## 2022-04-15 DIAGNOSIS — G4734 Idiopathic sleep related nonobstructive alveolar hypoventilation: Secondary | ICD-10-CM | POA: Diagnosis not present

## 2022-04-15 DIAGNOSIS — M47816 Spondylosis without myelopathy or radiculopathy, lumbar region: Secondary | ICD-10-CM | POA: Diagnosis not present

## 2022-04-15 DIAGNOSIS — S72011A Unspecified intracapsular fracture of right femur, initial encounter for closed fracture: Principal | ICD-10-CM | POA: Diagnosis present

## 2022-04-15 DIAGNOSIS — I10 Essential (primary) hypertension: Secondary | ICD-10-CM | POA: Diagnosis present

## 2022-04-15 DIAGNOSIS — W109XXA Fall (on) (from) unspecified stairs and steps, initial encounter: Secondary | ICD-10-CM | POA: Diagnosis not present

## 2022-04-15 DIAGNOSIS — T502X5A Adverse effect of carbonic-anhydrase inhibitors, benzothiadiazides and other diuretics, initial encounter: Secondary | ICD-10-CM | POA: Diagnosis present

## 2022-04-15 DIAGNOSIS — Z79899 Other long term (current) drug therapy: Secondary | ICD-10-CM

## 2022-04-15 DIAGNOSIS — E876 Hypokalemia: Secondary | ICD-10-CM | POA: Diagnosis not present

## 2022-04-15 DIAGNOSIS — Z833 Family history of diabetes mellitus: Secondary | ICD-10-CM

## 2022-04-15 DIAGNOSIS — E039 Hypothyroidism, unspecified: Secondary | ICD-10-CM | POA: Insufficient documentation

## 2022-04-15 DIAGNOSIS — E890 Postprocedural hypothyroidism: Secondary | ICD-10-CM | POA: Diagnosis not present

## 2022-04-15 DIAGNOSIS — Z043 Encounter for examination and observation following other accident: Secondary | ICD-10-CM | POA: Diagnosis not present

## 2022-04-15 DIAGNOSIS — M1612 Unilateral primary osteoarthritis, left hip: Secondary | ICD-10-CM | POA: Diagnosis not present

## 2022-04-15 DIAGNOSIS — Z803 Family history of malignant neoplasm of breast: Secondary | ICD-10-CM

## 2022-04-15 DIAGNOSIS — E785 Hyperlipidemia, unspecified: Secondary | ICD-10-CM | POA: Diagnosis not present

## 2022-04-15 DIAGNOSIS — Z7982 Long term (current) use of aspirin: Secondary | ICD-10-CM | POA: Diagnosis not present

## 2022-04-15 DIAGNOSIS — Z96641 Presence of right artificial hip joint: Secondary | ICD-10-CM | POA: Diagnosis not present

## 2022-04-15 DIAGNOSIS — T1490XA Injury, unspecified, initial encounter: Secondary | ICD-10-CM | POA: Diagnosis not present

## 2022-04-15 DIAGNOSIS — Z720 Tobacco use: Secondary | ICD-10-CM | POA: Insufficient documentation

## 2022-04-15 DIAGNOSIS — M8580 Other specified disorders of bone density and structure, unspecified site: Secondary | ICD-10-CM | POA: Diagnosis not present

## 2022-04-15 DIAGNOSIS — S72031A Displaced midcervical fracture of right femur, initial encounter for closed fracture: Secondary | ICD-10-CM | POA: Diagnosis not present

## 2022-04-15 DIAGNOSIS — M47812 Spondylosis without myelopathy or radiculopathy, cervical region: Secondary | ICD-10-CM | POA: Diagnosis not present

## 2022-04-15 DIAGNOSIS — Z79890 Hormone replacement therapy (postmenopausal): Secondary | ICD-10-CM | POA: Diagnosis not present

## 2022-04-15 DIAGNOSIS — D62 Acute posthemorrhagic anemia: Secondary | ICD-10-CM | POA: Diagnosis not present

## 2022-04-15 DIAGNOSIS — S72001S Fracture of unspecified part of neck of right femur, sequela: Secondary | ICD-10-CM | POA: Diagnosis not present

## 2022-04-15 LAB — BASIC METABOLIC PANEL
Anion gap: 10 (ref 5–15)
Anion gap: 9 (ref 5–15)
BUN: 13 mg/dL (ref 8–23)
BUN: 14 mg/dL (ref 8–23)
CO2: 24 mmol/L (ref 22–32)
CO2: 25 mmol/L (ref 22–32)
Calcium: 8.4 mg/dL — ABNORMAL LOW (ref 8.9–10.3)
Calcium: 8.2 mg/dL — ABNORMAL LOW (ref 8.9–10.3)
Chloride: 96 mmol/L — ABNORMAL LOW (ref 98–111)
Chloride: 94 mmol/L — ABNORMAL LOW (ref 98–111)
Creatinine, Ser: 0.5 mg/dL (ref 0.44–1.00)
Creatinine, Ser: 0.63 mg/dL (ref 0.44–1.00)
GFR, Estimated: >60 mL/min (ref >60)
GFR, Estimated: >60 mL/min (ref >60)
Glucose, Bld: 132 mg/dL — ABNORMAL HIGH (ref 70–99)
Glucose, Bld: 191 mg/dL — ABNORMAL HIGH (ref 70–99)
Potassium: 3.6 mmol/L (ref 3.5–5.1)
Potassium: 3.3 mmol/L — ABNORMAL LOW (ref 3.5–5.1)
Sodium: 130 mmol/L — ABNORMAL LOW (ref 135–145)
Sodium: 128 mmol/L — ABNORMAL LOW (ref 135–145)

## 2022-04-15 LAB — TYPE AND SCREEN
ABO/RH(D): O NEG
Antibody Screen: NEGATIVE

## 2022-04-15 LAB — CBC WITH DIFFERENTIAL/PLATELET
Abs Immature Granulocytes: 0.05 10*3/uL (ref 0.00–0.07)
Basophils Absolute: 0 10*3/uL (ref 0.0–0.1)
Basophils Relative: 0 %
Eosinophils Absolute: 0.1 10*3/uL (ref 0.0–0.5)
Eosinophils Relative: 1 %
HCT: 39.9 % (ref 36.0–46.0)
Hemoglobin: 13.8 g/dL (ref 12.0–15.0)
Immature Granulocytes: 0 %
Lymphocytes Relative: 7 %
Lymphs Abs: 0.9 10*3/uL (ref 0.7–4.0)
MCH: 30.3 pg (ref 26.0–34.0)
MCHC: 34.6 g/dL (ref 30.0–36.0)
MCV: 87.5 fL (ref 80.0–100.0)
Monocytes Absolute: 1.2 10*3/uL — ABNORMAL HIGH (ref 0.1–1.0)
Monocytes Relative: 10 %
Neutro Abs: 10.1 10*3/uL — ABNORMAL HIGH (ref 1.7–7.7)
Neutrophils Relative %: 82 %
Platelets: 273 10*3/uL (ref 150–400)
RBC: 4.56 MIL/uL (ref 3.87–5.11)
RDW: 14.6 % (ref 11.5–15.5)
WBC: 12.4 10*3/uL — ABNORMAL HIGH (ref 4.0–10.5)
nRBC: 0 % (ref 0.0–0.2)

## 2022-04-15 LAB — PROTIME-INR
INR: 1.1 (ref 0.8–1.2)
Prothrombin Time: 14.5 seconds (ref 11.4–15.2)

## 2022-04-15 MED ORDER — CHLORHEXIDINE GLUCONATE 4 % EX LIQD: Freq: Once | CUTANEOUS | Status: AC

## 2022-04-15 MED ORDER — ROSUVASTATIN CALCIUM 5 MG PO TABS
5.0000 mg | ORAL_TABLET | Freq: Every evening | ORAL | Status: DC
  Administered 2022-04-15 – 2022-04-19 (×5): 5 mg via ORAL
  Filled 2022-04-15 (×5): qty 1

## 2022-04-15 MED ORDER — SENNOSIDES-DOCUSATE SODIUM 8.6-50 MG PO TABS
1.0000 | ORAL_TABLET | Freq: Two times a day (BID) | ORAL | Status: DC
  Administered 2022-04-15 – 2022-04-19 (×7): 1 via ORAL
  Filled 2022-04-15 (×9): qty 1

## 2022-04-15 MED ORDER — SODIUM CHLORIDE 0.9 % IV SOLN: INTRAVENOUS | Status: DC

## 2022-04-15 MED ORDER — MORPHINE SULFATE (PF) 2 MG/ML IV SOLN: 1.0000 mg | INTRAVENOUS | Status: DC | PRN

## 2022-04-15 MED ORDER — TRANEXAMIC ACID-NACL 1000-0.7 MG/100ML-% IV SOLN
1000.0000 mg | INTRAVENOUS | Status: AC
  Administered 2022-04-16: 1000 mg via INTRAVENOUS
  Filled 2022-04-15: qty 100

## 2022-04-15 MED ORDER — LEVOTHYROXINE SODIUM 137 MCG PO TABS
137.0000 ug | ORAL_TABLET | Freq: Every day | ORAL | Status: DC
  Administered 2022-04-17 – 2022-04-20 (×4): 137 ug via ORAL
  Filled 2022-04-15 (×5): qty 1

## 2022-04-15 MED ORDER — FENTANYL CITRATE PF 50 MCG/ML IJ SOSY
50.0000 ug | PREFILLED_SYRINGE | Freq: Once | INTRAMUSCULAR | Status: AC
  Administered 2022-04-15: 50 ug via INTRAVENOUS
  Filled 2022-04-15: qty 1

## 2022-04-15 MED ORDER — GABAPENTIN 400 MG PO CAPS
400.0000 mg | ORAL_CAPSULE | Freq: Two times a day (BID) | ORAL | Status: DC
  Administered 2022-04-15: 400 mg via ORAL
  Filled 2022-04-15: qty 1

## 2022-04-15 MED ORDER — POTASSIUM CHLORIDE CRYS ER 20 MEQ PO TBCR
40.0000 meq | EXTENDED_RELEASE_TABLET | Freq: Once | ORAL | Status: AC
  Administered 2022-04-15: 40 meq via ORAL
  Filled 2022-04-15: qty 2

## 2022-04-15 MED ORDER — HYDROCODONE-ACETAMINOPHEN 5-325 MG PO TABS: 1.0000 | ORAL_TABLET | ORAL | Status: DC | PRN

## 2022-04-15 MED ORDER — ONDANSETRON HCL 4 MG PO TABS
4.0000 mg | ORAL_TABLET | Freq: Four times a day (QID) | ORAL | Status: DC | PRN
  Administered 2022-04-17: 4 mg via ORAL
  Filled 2022-04-15: qty 1

## 2022-04-15 MED ORDER — LISINOPRIL 10 MG PO TABS
10.0000 mg | ORAL_TABLET | Freq: Two times a day (BID) | ORAL | Status: DC
  Administered 2022-04-15 – 2022-04-20 (×9): 10 mg via ORAL
  Filled 2022-04-15 (×9): qty 1

## 2022-04-15 MED ORDER — ONDANSETRON HCL 4 MG/2ML IJ SOLN: 4.0000 mg | Freq: Four times a day (QID) | INTRAMUSCULAR | Status: DC | PRN

## 2022-04-15 MED ORDER — ONDANSETRON HCL 4 MG/2ML IJ SOLN
4.0000 mg | Freq: Once | INTRAMUSCULAR | Status: AC
  Administered 2022-04-15: 4 mg via INTRAVENOUS
  Filled 2022-04-15: qty 2

## 2022-04-15 MED ORDER — CEFAZOLIN SODIUM-DEXTROSE 2-4 GM/100ML-% IV SOLN
2.0000 g | INTRAVENOUS | Status: AC
  Administered 2022-04-16: 2 g via INTRAVENOUS

## 2022-04-15 NOTE — Assessment & Plan Note (Signed)
Continue levothyroxine.  TSH normal range.

## 2022-04-15 NOTE — H&P (Signed)
History and Physical    Patient: Hailey Wong HYW:[id number] DOB: [birth year] DOA: 04/15/2022 DOS: the patient was seen and examined on 04/15/2022 PCP: Lavera Guise, MD  Patient coming from: Home  Chief Complaint:  Chief Complaint  Patient presents with   Hip Pain    + right hip fx at [facility]   HPI: Hailey Wong is a 76 y.o. female with medical history significant of hypertension, hyperlipidemia, smoking and osteopenia.  She was in the garage yesterday evening and tripped up a step and had a fall and hit her head on the step.  Her husband got her up and mated to the recliner.  10 AM this morning she needed to be evaluated because she could not get up secondary to pain in her hip.  She went to [facility] and was diagnosed with a right hip fracture and sent into the emergency room for further evaluation.  Medical team asked for admission. Review of Systems: Review of Systems  Constitutional:  Positive for weight loss. Negative for chills and fever.  HENT:  Positive for congestion. Negative for sore throat.   Eyes:  Negative for blurred vision.  Respiratory:  Negative for cough and shortness of breath.   Cardiovascular:  Negative for chest pain.  Gastrointestinal:  Negative for abdominal pain, constipation, diarrhea, nausea and vomiting.  Genitourinary:  Negative for dysuria.  Musculoskeletal:  Positive for joint pain.  Skin:  Negative for rash.  Neurological:  Negative for dizziness.  Endo/Heme/Allergies:  Does not bruise/bleed easily.  Psychiatric/Behavioral:  Negative for depression.     Past Medical History:  Diagnosis Date   Chronic cough    History of COVID-19    04/2021   Hypertension    Hypothyroidism    Mild cardiomegaly    Sinus trouble    Sleep apnea    no CPAP   Past Surgical History:  Procedure Laterality Date   BROW LIFT Bilateral 12/13/2019   Procedure: BLEPHAROPLASTY UPPER EYELID; W/EXCESS SKIN BLEPHAROPTOSIS REPAIR; RESECT EX;  Surgeon: Karle Starch,  MD;  Location: [place];  Service: Ophthalmology;  Laterality: Bilateral;   CATARACT EXTRACTION W/PHACO Right 07/21/2021   Procedure: CATARACT EXTRACTION PHACO AND INTRAOCULAR LENS PLACEMENT ([place]) RIGHT 12.60 01:30.4;  Surgeon: Leandrew Koyanagi, MD;  Location: [place];  Service: Ophthalmology;  Laterality: Right;   CATARACT EXTRACTION W/PHACO Left 08/04/2021   Procedure: CATARACT EXTRACTION PHACO AND INTRAOCULAR LENS PLACEMENT ([facility]) LEFT 6.26 01:02.6;  Surgeon: Leandrew Koyanagi, MD;  Location: [place];  Service: Ophthalmology;  Laterality: Left;   COLONOSCOPY WITH PROPOFOL N/A 03/02/2021   Procedure: COLONOSCOPY WITH PROPOFOL;  Surgeon: Jonathon Bellows, MD;  Location: [facility] ENDOSCOPY;  Service: Gastroenterology;  Laterality: N/A;   right ankle surgery  1997   THYROIDECTOMY Bilateral 10/20/2020   Procedure: THYROIDECTOMY;  Surgeon: Beverly Gust, MD;  Location: [facility];  Service: ENT;  Laterality: Bilateral;   TUBAL LIGATION  1977   Social History:  reports that she has been smoking cigarettes. She has a 50.00 pack-year smoking history. She has never used smokeless tobacco. She reports current alcohol use of about 7.0 standard drinks of alcohol per week. She reports that she does not use drugs.  No Known Allergies  Family History  Problem Relation Age of Onset   Hypertension Mother    Diabetes Mother    Atrial fibrillation Mother    Hypertension Father    Pulmonary fibrosis Father    Breast cancer Sister     Prior  to Admission medications   Medication Sig Start Date End Date Taking? Authorizing Provider  ASPIRIN 81 PO Take by mouth.    [provider]  calcium carbonate (OS-CAL) 600 MG TABS tablet Take 600 mg by mouth 2 (two) times daily with a meal.    [provider]  Cholecalciferol (D3 ADULT PO) Take by mouth.    [provider]  fluticasone (FLONASE) 50 MCG/ACT nasal spray Place 1 spray into both nostrils daily as  needed for allergies or rhinitis.    [provider]  hydrochlorothiazide (HYDRODIURIL) 12.5 MG tablet TAKE 1 TABLET BY MOUTH ONCE DAILY 03/28/22   McDonough, Lauren K, PA-C  ibandronate (BONIVA) 150 MG tablet Take in the morning with a full glass of water, on an empty stomach, and do not take anything else by mouth or lie down for the next 30 min. 01/18/22   Lavera Guise, MD  levothyroxine (SYNTHROID) 137 MCG tablet Take 137 mcg by mouth daily before breakfast.    [provider]  lisinopril (ZESTRIL) 10 MG tablet TAKE 1 TABLET BY MOUTH TWICE DAILY 11/16/21   Lavera Guise, MD  rosuvastatin (CRESTOR) 5 MG tablet TAKE 1 TABLET BY MOUTH ONCE EVERY EVENING 02/18/22   McDonough, Si Gaul, PA-C  Sennosides-Docusate Sodium 8.6-50 MG CAPS Take by mouth.    [provider]    Physical Exam: Vitals:   04/15/22 1409 04/15/22 1410 04/15/22 1411  BP: (!) 149/92    Pulse:  91   Resp: 18    Temp: 98.4 F (36.9 C)    TempSrc: Oral    SpO2:  96%   Weight:   68 kg  Height:   '5\' 1"'$  (1.549 m)   Physical Exam HENT:     Head: Normocephalic.     Mouth/Throat:     Pharynx: No oropharyngeal exudate.  Eyes:     General: Lids are normal.     Conjunctiva/sclera: Conjunctivae normal.  Cardiovascular:     Rate and Rhythm: Normal rate and regular rhythm.     Heart sounds: Normal heart sounds, S1 normal and S2 normal.  Pulmonary:     Breath sounds: No decreased breath sounds, wheezing, rhonchi or rales.  Abdominal:     Palpations: Abdomen is soft.     Tenderness: There is no abdominal tenderness.  Musculoskeletal:     Right lower leg: No swelling.     Left lower leg: No swelling.  Skin:    General: Skin is warm.     Findings: No rash.     Comments: Bruising around left eye  Neurological:     Mental Status: She is alert and oriented to person, place, and time.     Comments: Able to wiggle toes bilaterally.     Data Reviewed: Reviewed picture from [facility] which shows  a complete right hip fracture White blood cell count 12.4.  Hemoglobin 13.8 Sodium 128, potassium 3.3. EKG reviewed by me shows normal sinus rhythm, left anterior fascicular block.  Assessment and Plan: * Closed right hip fracture ([place]) Sent in by [facility] and they will operate tomorrow.  Will keep n.p.o. after midnight.  No contraindications to surgery at this time.  Hyponatremia Likely secondary to hydrochlorothiazide.  Discontinue hydrochlorothiazide.  Fluid restrict.  Sodium may go lower with IV fluid hydration.  Send off urine sodium and urine osmolarity.  Hyperlipidemia Continue Crestor  Essential hypertension Continue lisinopril, hold hydrochlorothiazide  Hypokalemia Replace potassium orally.  Osteopenia Resume Boniva as outpatient.  Tobacco abuse Patient not interested in nicotine patch at this point.  Hypothyroidism Continue levothyroxine.  Check a TSH in the a.m.      Advance Care Planning:   Code Status: Full Code   Consults: Orthopedic surgery    Severity of Illness: The appropriate patient status for this patient is INPATIENT. Inpatient status is judged to be reasonable and necessary in order to provide the required intensity of service to ensure the patient's safety. The patient's presenting symptoms, physical exam findings, and initial radiographic and laboratory data in the context of their chronic comorbidities is felt to place them at high risk for further clinical deterioration. Furthermore, it is not anticipated that the patient will be medically stable for discharge from the hospital within 2 midnights of admission.   * I certify that at the point of admission it is my clinical judgment that the patient will require inpatient hospital care spanning beyond 2 midnights from the point of admission due to high intensity of service, high risk for further deterioration and high frequency of surveillance required.*  Author: Loletha Grayer, MD 04/15/2022  6:36 PM  For on call review [email or web address].

## 2022-04-15 NOTE — Assessment & Plan Note (Addendum)
Continue lisinopril, hold hydrochlorothiazide

## 2022-04-15 NOTE — Assessment & Plan Note (Signed)
Resume Boniva as outpatient.

## 2022-04-15 NOTE — Assessment & Plan Note (Signed)
To the operating room today for hip repair.

## 2022-04-15 NOTE — ED Notes (Signed)
Dark purple bruising noted to left eye. Swelling noted

## 2022-04-15 NOTE — ED Triage Notes (Signed)
Pt to ED via POV from [facility] after fall. Pt sent to due xray showing + right hip fx. Pt denies blood thinners.

## 2022-04-15 NOTE — Assessment & Plan Note (Signed)
Patient not interested in nicotine patch at this point.

## 2022-04-15 NOTE — ED Provider Notes (Signed)
[facility] Provider Note    Event Date/Time   First MD Initiated Contact with Patient 04/15/22 1412     (approximate)   History   Chief Complaint Hip Pain (+ right hip fx at [facility])   HPI  Hailey Wong is a 76 y.o. female with past medical history of hypertension and hypothyroidism who presents to the ED complaining of hip pain.  Patient ports last night she was going up the stairs in her home when she lost her balance and fell forward, striking her right hip as well as her head.  She did not lose consciousness and does not take any blood thinners, denies any headache or neck pain.  She does report some pain in her right hip with difficulty bending her right leg.  She was evaluated at [facility] earlier today and diagnosed with a hip fracture, sent to the ED for further evaluation.  She denies any pain in her chest, abdomen, upper extremities, or left lower extremity.     Physical Exam   Triage Vital Signs: ED Triage Vitals  Enc Vitals Group     BP 04/15/22 1409 (!) 149/92     Pulse Rate 04/15/22 1410 91     Resp 04/15/22 1409 18     Temp 04/15/22 1409 98.4 F (36.9 C)     Temp Source 04/15/22 1409 Oral     SpO2 04/15/22 1410 96 %     Weight 04/15/22 1411 150 lb (68 kg)     Height 04/15/22 1411 '5\' 1"'$  (1.549 m)     Head Circumference --      Peak Flow --      Pain Score 04/15/22 1410 7     Pain Loc --      Pain Edu? --      Excl. in [place]? --     Most recent vital signs: Vitals:   04/15/22 1409 04/15/22 1410  BP: (!) 149/92   Pulse:  91  Resp: 18   Temp: 98.4 F (36.9 C)   SpO2:  96%    Constitutional: Alert and oriented. Eyes: Conjunctivae are normal. Head: Mild left periorbital ecchymosis and edema.  No scalp hematomas or step-offs. Nose: No congestion/rhinnorhea. Mouth/Throat: Mucous membranes are moist.  Neck: No midline cervical spine tenderness to palpation. Cardiovascular: Normal rate, regular rhythm. Grossly normal  heart sounds.  2+ radial and DP pulses bilaterally. Respiratory: Normal respiratory effort.  No retractions. Lungs CTAB. Gastrointestinal: Soft and nontender. No distention. Musculoskeletal: Diffuse tenderness to palpation at her right hip with limited range of motion.  No tenderness to palpation at left hip, bilateral knees or ankles. Neurologic:  Normal speech and language. No gross focal neurologic deficits are appreciated.    ED Results / Procedures / Treatments   Labs (all labs ordered are listed, but only abnormal results are displayed) Labs Reviewed  CBC WITH DIFFERENTIAL/PLATELET - Abnormal; Notable for the following components:      Result Value   WBC 12.4 (*)    All other components within normal limits  BASIC METABOLIC PANEL   RADIOLOGY Right hip x-ray reviewed and interpreted by me with displaced femoral neck fracture, no dislocation.  Chest x-ray reviewed and interpreted by me with no infiltrate, edema, or effusion.  PROCEDURES:  Critical Care performed: No  Procedures   MEDICATIONS ORDERED IN ED: Medications - No data to display   IMPRESSION / MDM / [place] / ED COURSE  I reviewed the triage  vital signs and the nursing notes.                              76 y.o. female with past medical history of hypertension and hypothyroidism who presents to the ED following trip and fall last night, found to have right hip fracture by outpatient orthopedics.  Patient's presentation is most consistent with acute presentation with potential threat to life or bodily function.  Differential diagnosis includes, but is not limited to, hip fracture, hip dislocation, neurovascular compromise, intracranial injury, cervical spine injury, facial fracture.  Patient well-appearing and in no acute distress, vital signs are unremarkable.  She is neurovascularly intact to her distal right lower extremity, unfortunately I am not able to see her outpatient x-rays.  X-rays were  repeated here in the ED and confirmed displaced femoral neck fracture.  Dr. Sabra Heck of orthopedics is aware and will plan on operative intervention tomorrow.  We will also check CT imaging of head, face, and neck given significant head trauma.  Medical screening labs are pending.  Patient turned over to oncoming provider pending CT and lab results, followed by admission to the hospitalist service.      FINAL CLINICAL IMPRESSION(S) / ED DIAGNOSES   Final diagnoses:  Closed fracture of right hip, initial encounter [facility])     Rx / DC Orders   ED Discharge Orders     None        Note:  This document was prepared using Dragon voice recognition software and may include unintentional dictation errors.   Blake Divine, MD 04/15/22 1540

## 2022-04-15 NOTE — Assessment & Plan Note (Addendum)
Likely secondary to hydrochlorothiazide.  Discontinue hydrochlorothiazide.  Fluid restrict.  Urine sodium and urine osmolarity ordered.  Recommend discontinue IV fluids after surgery.

## 2022-04-15 NOTE — Assessment & Plan Note (Signed)
Continue Crestor 

## 2022-04-15 NOTE — Assessment & Plan Note (Addendum)
Replaced. °

## 2022-04-15 NOTE — ED Triage Notes (Signed)
Pt presents to the ED via POV from home due to a trip fall. Pt went to [facility], xray showing + R hip fx. Pt denies blood thinners, LOC, and NVD. Pt A&Ox4

## 2022-04-16 ENCOUNTER — Inpatient Hospital Stay: Payer: Medicare PPO

## 2022-04-16 ENCOUNTER — Encounter
Admission: EM | Disposition: A | Discharge status: Home Health | Payer: Self-pay | Source: Ambulatory Visit | Attending: Internal Medicine

## 2022-04-16 ENCOUNTER — Other Ambulatory Visit: Payer: Self-pay

## 2022-04-16 ENCOUNTER — Inpatient Hospital Stay: Admission: RE | Admit: 2022-04-16 | Payer: Medicare PPO | Source: Ambulatory Visit | Admitting: Specialist

## 2022-04-16 ENCOUNTER — Inpatient Hospital Stay: Payer: Medicare PPO | Admitting: Anesthesiology

## 2022-04-16 DIAGNOSIS — I10 Essential (primary) hypertension: Secondary | ICD-10-CM | POA: Diagnosis not present

## 2022-04-16 DIAGNOSIS — S72001S Fracture of unspecified part of neck of right femur, sequela: Secondary | ICD-10-CM

## 2022-04-16 DIAGNOSIS — E871 Hypo-osmolality and hyponatremia: Secondary | ICD-10-CM | POA: Diagnosis not present

## 2022-04-16 DIAGNOSIS — E785 Hyperlipidemia, unspecified: Secondary | ICD-10-CM | POA: Diagnosis not present

## 2022-04-16 DIAGNOSIS — G4730 Sleep apnea, unspecified: Secondary | ICD-10-CM

## 2022-04-16 HISTORY — PX: HIP ARTHROPLASTY: SHX981

## 2022-04-16 LAB — CBC
HCT: 37 % (ref 36.0–46.0)
Hemoglobin: 12.7 g/dL (ref 12.0–15.0)
MCH: 30 pg (ref 26.0–34.0)
MCHC: 34.3 g/dL (ref 30.0–36.0)
MCV: 87.5 fL (ref 80.0–100.0)
Platelets: 244 10*3/uL (ref 150–400)
RBC: 4.23 MIL/uL (ref 3.87–5.11)
RDW: 14.7 % (ref 11.5–15.5)
WBC: 11.6 10*3/uL — ABNORMAL HIGH (ref 4.0–10.5)
nRBC: 0 % (ref 0.0–0.2)

## 2022-04-16 LAB — BASIC METABOLIC PANEL
Anion gap: 6 (ref 5–15)
BUN: 13 mg/dL (ref 8–23)
CO2: 25 mmol/L (ref 22–32)
Calcium: 8 mg/dL — ABNORMAL LOW (ref 8.9–10.3)
Chloride: 98 mmol/L (ref 98–111)
Creatinine, Ser: 0.53 mg/dL (ref 0.44–1.00)
GFR, Estimated: >60 mL/min (ref >60)
Glucose, Bld: 140 mg/dL — ABNORMAL HIGH (ref 70–99)
Potassium: 4.2 mmol/L (ref 3.5–5.1)
Sodium: 129 mmol/L — ABNORMAL LOW (ref 135–145)

## 2022-04-16 LAB — SURGICAL PCR SCREEN
MRSA, PCR: NEGATIVE
Staphylococcus aureus: POSITIVE — AB

## 2022-04-16 LAB — GLUCOSE, CAPILLARY: Glucose-Capillary: 134 mg/dL — ABNORMAL HIGH (ref 70–99)

## 2022-04-16 LAB — TSH: TSH: 0.466 u[IU]/mL (ref 0.350–4.500)

## 2022-04-16 SURGERY — HEMIARTHROPLASTY, HIP, DIRECT ANTERIOR APPROACH, FOR FRACTURE
Anesthesia: Spinal | Site: Hip | Laterality: Right

## 2022-04-16 MED ORDER — METHOCARBAMOL 500 MG PO TABS: 500.0000 mg | ORAL_TABLET | Freq: Four times a day (QID) | ORAL | Status: DC | PRN

## 2022-04-16 MED ORDER — FENTANYL CITRATE (PF) 100 MCG/2ML IJ SOLN: 25.0000 ug | INTRAMUSCULAR | Status: DC | PRN

## 2022-04-16 MED ORDER — HYDROCODONE-ACETAMINOPHEN 5-325 MG PO TABS
1.0000 | ORAL_TABLET | ORAL | Status: DC | PRN
  Administered 2022-04-17 – 2022-04-18 (×4): 1 via ORAL
  Filled 2022-04-16: qty 1
  Filled 2022-04-16: qty 2
  Filled 2022-04-16 (×2): qty 1
  Filled 2022-04-16: qty 2

## 2022-04-16 MED ORDER — GABAPENTIN 300 MG PO CAPS
300.0000 mg | ORAL_CAPSULE | Freq: Two times a day (BID) | ORAL | Status: DC
  Administered 2022-04-16 – 2022-04-20 (×8): 300 mg via ORAL
  Filled 2022-04-16 (×8): qty 1

## 2022-04-16 MED ORDER — PROPOFOL 10 MG/ML IV BOLUS
INTRAVENOUS | Status: AC
  Filled 2022-04-16: qty 20

## 2022-04-16 MED ORDER — TRANEXAMIC ACID-NACL 1000-0.7 MG/100ML-% IV SOLN
INTRAVENOUS | Status: AC
  Filled 2022-04-16: qty 100

## 2022-04-16 MED ORDER — ACETAMINOPHEN 10 MG/ML IV SOLN
INTRAVENOUS | Status: AC
  Filled 2022-04-16: qty 100

## 2022-04-16 MED ORDER — 0.9 % SODIUM CHLORIDE (POUR BTL) OPTIME
TOPICAL | Status: DC | PRN
  Administered 2022-04-16: 1000 mL

## 2022-04-16 MED ORDER — METOCLOPRAMIDE HCL 5 MG/ML IJ SOLN: 5.0000 mg | Freq: Three times a day (TID) | INTRAMUSCULAR | Status: DC | PRN

## 2022-04-16 MED ORDER — OXYCODONE HCL 5 MG PO TABS: 5.0000 mg | ORAL_TABLET | Freq: Once | ORAL | Status: DC | PRN

## 2022-04-16 MED ORDER — MORPHINE SULFATE (PF) 2 MG/ML IV SOLN: 0.5000 mg | INTRAVENOUS | Status: DC | PRN

## 2022-04-16 MED ORDER — BUPIVACAINE-EPINEPHRINE (PF) 0.25% -1:200000 IJ SOLN
INTRAMUSCULAR | Status: DC | PRN
  Administered 2022-04-16: 30 mL via PERINEURAL

## 2022-04-16 MED ORDER — ACETAMINOPHEN 10 MG/ML IV SOLN
INTRAVENOUS | Status: DC | PRN
  Administered 2022-04-16: 1000 mg via INTRAVENOUS

## 2022-04-16 MED ORDER — BUPIVACAINE-EPINEPHRINE (PF) 0.25% -1:200000 IJ SOLN
INTRAMUSCULAR | Status: AC
  Filled 2022-04-16: qty 30

## 2022-04-16 MED ORDER — FENTANYL CITRATE (PF) 100 MCG/2ML IJ SOLN
INTRAMUSCULAR | Status: DC | PRN
  Administered 2022-04-16 (×2): 50 ug via INTRAVENOUS

## 2022-04-16 MED ORDER — ZOLPIDEM TARTRATE 5 MG PO TABS: 5.0000 mg | ORAL_TABLET | Freq: Every evening | ORAL | Status: DC | PRN

## 2022-04-16 MED ORDER — BUPIVACAINE HCL (PF) 0.5 % IJ SOLN
INTRAMUSCULAR | Status: DC | PRN
  Administered 2022-04-16: 3 mL

## 2022-04-16 MED ORDER — HYDROCODONE-ACETAMINOPHEN 7.5-325 MG PO TABS: 1.0000 | ORAL_TABLET | ORAL | Status: DC | PRN

## 2022-04-16 MED ORDER — VITAMIN D 25 MCG (1000 UNIT) PO TABS
1000.0000 [IU] | ORAL_TABLET | Freq: Every day | ORAL | Status: DC
  Administered 2022-04-16 – 2022-04-20 (×5): 1000 [IU] via ORAL
  Filled 2022-04-16 (×4): qty 1

## 2022-04-16 MED ORDER — MENTHOL 3 MG MT LOZG: 1.0000 | LOZENGE | OROMUCOSAL | Status: DC | PRN

## 2022-04-16 MED ORDER — NEOMYCIN-POLYMYXIN B GU 40-200000 IR SOLN
Status: DC | PRN
  Administered 2022-04-16: 8 mL

## 2022-04-16 MED ORDER — BISACODYL 10 MG RE SUPP
10.0000 mg | Freq: Every day | RECTAL | Status: DC | PRN
  Administered 2022-04-19: 10 mg via RECTAL
  Filled 2022-04-16 (×2): qty 1

## 2022-04-16 MED ORDER — FLUTICASONE PROPIONATE 50 MCG/ACT NA SUSP: 1.0000 | Freq: Every day | NASAL | Status: DC | PRN

## 2022-04-16 MED ORDER — MELOXICAM 7.5 MG PO TABS
15.0000 mg | ORAL_TABLET | Freq: Every day | ORAL | Status: AC
  Administered 2022-04-16 – 2022-04-20 (×5): 15 mg via ORAL
  Filled 2022-04-16 (×5): qty 2

## 2022-04-16 MED ORDER — METOCLOPRAMIDE HCL 5 MG PO TABS: 5.0000 mg | ORAL_TABLET | Freq: Three times a day (TID) | ORAL | Status: DC | PRN

## 2022-04-16 MED ORDER — PHENYLEPHRINE HCL-NACL 20-0.9 MG/250ML-% IV SOLN
INTRAVENOUS | Status: DC | PRN
  Administered 2022-04-16: 50 ug/min via INTRAVENOUS

## 2022-04-16 MED ORDER — ENOXAPARIN SODIUM 30 MG/0.3ML IJ SOSY
30.0000 mg | PREFILLED_SYRINGE | INTRAMUSCULAR | Status: DC
  Administered 2022-04-17 – 2022-04-18 (×2): 30 mg via SUBCUTANEOUS
  Filled 2022-04-16 (×2): qty 0.3

## 2022-04-16 MED ORDER — FERROUS SULFATE 325 (65 FE) MG PO TABS
325.0000 mg | ORAL_TABLET | Freq: Every day | ORAL | Status: DC
  Administered 2022-04-17 – 2022-04-20 (×3): 325 mg via ORAL
  Filled 2022-04-16 (×4): qty 1

## 2022-04-16 MED ORDER — MUPIROCIN 2 % EX OINT
1.0000 | TOPICAL_OINTMENT | Freq: Two times a day (BID) | CUTANEOUS | Status: DC
  Administered 2022-04-16 – 2022-04-20 (×8): 1 via NASAL
  Filled 2022-04-16: qty 22

## 2022-04-16 MED ORDER — DOCUSATE SODIUM 100 MG PO CAPS
100.0000 mg | ORAL_CAPSULE | Freq: Two times a day (BID) | ORAL | Status: DC
  Administered 2022-04-16 – 2022-04-19 (×6): 100 mg via ORAL
  Filled 2022-04-16 (×8): qty 1

## 2022-04-16 MED ORDER — CEFAZOLIN SODIUM-DEXTROSE 2-4 GM/100ML-% IV SOLN
INTRAVENOUS | Status: AC
  Filled 2022-04-16: qty 100

## 2022-04-16 MED ORDER — CEFAZOLIN SODIUM-DEXTROSE 2-4 GM/100ML-% IV SOLN
2.0000 g | Freq: Three times a day (TID) | INTRAVENOUS | Status: AC
  Administered 2022-04-16 – 2022-04-17 (×3): 2 g via INTRAVENOUS
  Filled 2022-04-16 (×3): qty 100

## 2022-04-16 MED ORDER — METHOCARBAMOL 1000 MG/10ML IJ SOLN: 500.0000 mg | Freq: Four times a day (QID) | INTRAVENOUS | Status: DC | PRN

## 2022-04-16 MED ORDER — OXYCODONE HCL 5 MG/5ML PO SOLN: 5.0000 mg | Freq: Once | ORAL | Status: DC | PRN

## 2022-04-16 MED ORDER — CALCIUM CARBONATE 1250 (500 CA) MG PO TABS
1.0000 | ORAL_TABLET | Freq: Two times a day (BID) | ORAL | Status: DC
  Administered 2022-04-16 – 2022-04-20 (×5): 1250 mg via ORAL
  Filled 2022-04-16 (×9): qty 1

## 2022-04-16 MED ORDER — ALUM & MAG HYDROXIDE-SIMETH 200-200-20 MG/5ML PO SUSP: 30.0000 mL | ORAL | Status: DC | PRN

## 2022-04-16 MED ORDER — TRANEXAMIC ACID-NACL 1000-0.7 MG/100ML-% IV SOLN
1000.0000 mg | Freq: Once | INTRAVENOUS | Status: AC
  Administered 2022-04-17: 1000 mg via INTRAVENOUS
  Filled 2022-04-16 (×2): qty 100

## 2022-04-16 MED ORDER — ACETAMINOPHEN 325 MG PO TABS: 325.0000 mg | ORAL_TABLET | Freq: Four times a day (QID) | ORAL | Status: DC | PRN

## 2022-04-16 MED ORDER — FLEET ENEMA 7-19 GM/118ML RE ENEM: 1.0000 | ENEMA | Freq: Once | RECTAL | Status: DC | PRN

## 2022-04-16 MED ORDER — LACTATED RINGERS IV SOLN: INTRAVENOUS | Status: DC | PRN

## 2022-04-16 MED ORDER — FENTANYL CITRATE (PF) 100 MCG/2ML IJ SOLN
INTRAMUSCULAR | Status: AC
  Filled 2022-04-16: qty 2

## 2022-04-16 MED ORDER — PROPOFOL 500 MG/50ML IV EMUL
INTRAVENOUS | Status: DC | PRN
  Administered 2022-04-16: 100 ug/kg/min via INTRAVENOUS

## 2022-04-16 MED ORDER — EPHEDRINE SULFATE (PRESSORS) 50 MG/ML IJ SOLN
INTRAMUSCULAR | Status: DC | PRN
  Administered 2022-04-16 (×2): 10 mg via INTRAVENOUS

## 2022-04-16 MED ORDER — PHENYLEPHRINE HCL (PRESSORS) 10 MG/ML IV SOLN
INTRAVENOUS | Status: DC | PRN
  Administered 2022-04-16 (×2): 80 ug via INTRAVENOUS

## 2022-04-16 MED ORDER — PHENOL 1.4 % MT LIQD: 1.0000 | OROMUCOSAL | Status: DC | PRN

## 2022-04-16 MED ORDER — SODIUM CHLORIDE 0.45 % IV SOLN: INTRAVENOUS | Status: DC

## 2022-04-16 SURGICAL SUPPLY — 51 items
BLADE SAGITTAL WIDE XTHICK NO (BLADE) ×1 IMPLANT
CHLORAPREP W/TINT 26 (MISCELLANEOUS) ×2 IMPLANT
COVER BACK TABLE REUSABLE LG (DRAPES) ×1 IMPLANT
DRAPE INCISE IOBAN 66X60 STRL (DRAPES) ×2 IMPLANT
DRSG XEROFORM 1X8 (GAUZE/BANDAGES/DRESSINGS) IMPLANT
ELECT CAUTERY BLADE 6.4 (BLADE) ×1 IMPLANT
ELECT REM PT RETURN 9FT ADLT (ELECTROSURGICAL) ×1
ELECTRODE REM PT RTRN 9FT ADLT (ELECTROSURGICAL) ×1 IMPLANT
GAUZE 4X4 16PLY ~~LOC~~+RFID DBL (SPONGE) ×1 IMPLANT
GAUZE SPONGE 4X4 12PLY STRL (GAUZE/BANDAGES/DRESSINGS) ×2 IMPLANT
GAUZE XEROFORM 1X8 LF (GAUZE/BANDAGES/DRESSINGS) ×2 IMPLANT
GLOVE SURG ORTHO 8.5 STRL (GLOVE) ×1 IMPLANT
GLOVE SURG UNDER LTX SZ8 (GLOVE) ×1 IMPLANT
GOWN STRL REUS W/ TWL LRG LVL3 (GOWN DISPOSABLE) ×2 IMPLANT
GOWN STRL REUS W/TWL LRG LVL3 (GOWN DISPOSABLE) ×2
GOWN STRL REUS W/TWL LRG LVL4 (GOWN DISPOSABLE) ×1 IMPLANT
HEAD MODULAR ENDO (Orthopedic Implant) ×1 IMPLANT
HEAD UNPLR 42XMDLR STRL HIP (Orthopedic Implant) IMPLANT
HEMOVAC 400CC 10FR (MISCELLANEOUS) ×1 IMPLANT
HOLSTER ELECTROSUGICAL PENCIL (MISCELLANEOUS) ×1 IMPLANT
IV NS 1000ML (IV SOLUTION) ×1
IV NS 1000ML BAXH (IV SOLUTION) ×1 IMPLANT
KIT TURNOVER KIT A (KITS) ×1 IMPLANT
MANIFOLD NEPTUNE II (INSTRUMENTS) ×1 IMPLANT
NDL FILTER BLUNT 18X1 1/2 (NEEDLE) ×1 IMPLANT
NDL SPNL 18GX3.5 QUINCKE PK (NEEDLE) ×2 IMPLANT
NEEDLE FILTER BLUNT 18X1 1/2 (NEEDLE) ×1 IMPLANT
NEEDLE SPNL 18GX3.5 QUINCKE PK (NEEDLE) ×2 IMPLANT
NS IRRIG 1000ML POUR BTL (IV SOLUTION) ×1 IMPLANT
PACK HIP PROSTHESIS (MISCELLANEOUS) ×1 IMPLANT
PAD ABD DERMACEA PRESS 5X9 (GAUZE/BANDAGES/DRESSINGS) IMPLANT
PILLOW ABDUCTION FOAM SM (MISCELLANEOUS) IMPLANT
PULSAVAC PLUS IRRIG FAN TIP (DISPOSABLE) ×1
SLEEVE UNITRAX V40 STD (Orthopedic Implant) IMPLANT
SOL PREP PVP 2OZ (MISCELLANEOUS) ×1
SOLUTION PREP PVP 2OZ (MISCELLANEOUS) ×1 IMPLANT
SPONGE T-LAP 18X18 ~~LOC~~+RFID (SPONGE) ×4 IMPLANT
STAPLER SKIN PROX 35W (STAPLE) ×1 IMPLANT
STEM FEM ACCOLADE 38X102X30 S3 (Stem) IMPLANT
SUT DVC 2 QUILL PDO  T11 36X36 (SUTURE) ×2
SUT DVC 2 QUILL PDO T11 36X36 (SUTURE) ×2 IMPLANT
SUT QUILL PDO 0 36 36 VIOLET (SUTURE) ×1 IMPLANT
SUT TICRON 2-0 30IN 311381 (SUTURE) ×2 IMPLANT
SYR 10ML LL (SYRINGE) ×1 IMPLANT
SYR 30ML LL (SYRINGE) ×1 IMPLANT
SYR 50ML LL SCALE MARK (SYRINGE) ×1 IMPLANT
TAPE MICROFOAM 4IN (TAPE) ×1 IMPLANT
TIP FAN IRRIG PULSAVAC PLUS (DISPOSABLE) ×1 IMPLANT
TRAP FLUID SMOKE EVACUATOR (MISCELLANEOUS) ×1 IMPLANT
TUBE SUCT KAM VAC (TUBING) ×1 IMPLANT
WATER STERILE IRR 1000ML POUR (IV SOLUTION) ×1 IMPLANT

## 2022-04-16 NOTE — ED Notes (Signed)
Patient sat decreased to 86-88% on RA during rest. Pablo Ledger, CRNP notified. Patient placed on 2L oxygen [place]. Sat increased to 98% on 2L

## 2022-04-16 NOTE — Hospital Course (Signed)
76 y.o. female with medical history significant of hypertension, hyperlipidemia, smoking and osteopenia.  She was in the garage yesterday evening and tripped up a step and had a fall and hit her head on the step.  Her husband got her up and mated to the recliner.  10 AM this morning she needed to be evaluated because she could not get up secondary to pain in her hip.  She went to [facility] and was diagnosed with a right hip fracture and sent into the emergency room for further evaluation.   12/23.  Patient to the operating room for repair of right hip fracture.  Will get overnight oximetry to see if she qualifies for nocturnal oxygen.  Recommend discontinue IV fluids after surgery. 12/24.  Patient qualifies for nocturnal oxygen with overnight oximetry results in chart.  Seen walking in hallway with physical therapist. 12/25.  Hemoglobin 10.6 and sodium 131. 12/26.  Hemoglobin 10.2 and sodium 131. 12/27: Hemoglobin 10.4 and sodium of 133.  Although her physical therapist recommended rehab but patient would like to go home with home health services which were ordered.  She refused rehab.  She is being discharged home with home health services and rest of the care will be provided by family.  He is being given aspirin 81 mg twice daily for 2 weeks as advised by orthopedic surgery for DVT prophylaxis.  She is also being given pain medications and muscle relaxant to be used as needed.  She was started on iron supplement . HCTZ was discontinued due to hyponatremia.  Patient has also been given oxygen to use at night.  She will continue with rest of her home medications and follow-up with her providers for further recommendations.

## 2022-04-16 NOTE — H&P (Signed)
THE PATIENT WAS SEEN PRIOR TO SURGERY TODAY.  HISTORY, ALLERGIES, HOME MEDICATIONS AND OPERATIVE PROCEDURE WERE REVIEWED. RISKS AND BENEFITS OF SURGERY DISCUSSED WITH PATIENT AGAIN.  NO CHANGES FROM INITIAL HISTORY AND PHYSICAL NOTED.    

## 2022-04-16 NOTE — Op Note (Signed)
04/16/2022  1:36 PM  PATIENT:  Hailey Wong   MRN: [id number]  PRE-OPERATIVE DIAGNOSIS:  Displaced Subcapital fracture right hip   POST-OPERATIVE DIAGNOSIS: Same  PROCEDURE: Right   hip hemiarthroplasty with Stryker Accolade prosthesis  PREOPERATIVE INDICATIONS:  Hailey Wong is an 76 y.o. female who was admitted 04/15/2022 with a diagnosis of displaced subcapital fracture of the hip and elected for surgical management.  The risks benefits and alternatives were discussed with the patient including but not limited to the risks of nonoperative treatment, versus surgical intervention including infection, bleeding, nerve injury, periprosthetic fracture, the need for revision surgery, dislocation, leg length discrepancy, blood clots, cardiopulmonary complications, morbidity, mortality, among others, and they were willing to proceed.  Predicted outcome is good, although there will be at least a six to nine month expected recovery.     SURGEON:  Earnestine Leys, MD  ASST:    ANESTHESIA: Spinal    COMPLICATIONS:  None.   EBL: 50 cc    COMPONENTS:  Stryker Accolade Femoral Fracture stem size #  3  ,   and a size   42 mm  fracture head unipolar hip ball with    standard mm  neck length.    PROCEDURE IN DETAIL: The patient was met in the holding area and identified.  The appropriate hip  was marked at the operative site. The patient was then transported to the OR and  placed under general anesthesia.  At that point, the patient was  placed in the lateral decubitus position with the operative side up and  secured to the operating room table and all bony prominences padded.     The operative lower extremity was prepped from the iliac crest to the toes.  Sterile draping was performed.  Time out was performed prior to incision.      A routine posterolateral approach was utilized via sharp dissection  carried down to the subcutaneous tissue.  Gross bleeders were Bovie  coagulated.  The iliotibial band  was identified and incised  along the length of the skin incision.  Self-retaining retractors were  inserted.  With the hip internally rotated, the short external rotators  were identified. The piriformis was tagged and the hip capsule released in a T-type fashion.  The femoral neck was exposed, and I resected the femoral neck using the appropriate jig. This was performed at approximately a thumb's breadth above the lesser trochanter.    I then exposed the deep acetabulum, cleared out any tissue including the ligamentum teres.    I then prepared the proximal femur using the cookie-cutter, the lateralizing reamer, and then sequentially broached.  A trial stem   was  utilized along with a unipolar head and neck.  I reduced the hip and it was found to have excellent stability with functional range of motion. Leg lengths were equal.  The trial components were then removed.   The same size Accolade femoral stem was then inserted and was very stable.  The Unitrax head and neck as trialed were inserted as well.     The hip was then reduced and taken through functional range of motion and found to have excellent stability. Leg lengths were restored.     I closed the T in the capsule with #2 Ticron as well as the short external rotators. A hemovac was inserted.    I then irrigated the hip copiously again with pulse lavage, and repaired the fascia with #2 Quill and the subcutaneous layer  with #0 Quill. Sponge and needle counts were correct. Dry sterile Aquacell was applied.   The patient was then awakened and returned to PACU in stable and satisfactory condition. There were no complications.  Park Breed, MD Orthopedic Surgeon [phone]   04/16/2022 1:36 PM

## 2022-04-16 NOTE — Assessment & Plan Note (Signed)
Patient does not wear a CPAP.  Qualifies for nocturnal oxygen with overnight oximetry.

## 2022-04-16 NOTE — Anesthesia Postprocedure Evaluation (Signed)
Anesthesia Post Note  Patient: Hailey Wong  Procedure(s) Performed: ARTHROPLASTY BIPOLAR HIP (HEMIARTHROPLASTY) (Right: Hip)  Patient location during evaluation: PACU Anesthesia Type: Spinal Level of consciousness: oriented and awake and alert Pain management: pain level controlled Vital Signs Assessment: post-procedure vital signs reviewed and stable Respiratory status: spontaneous breathing, respiratory function stable and patient connected to nasal cannula oxygen Cardiovascular status: blood pressure returned to baseline and stable Postop Assessment: no headache, no backache and no apparent nausea or vomiting Anesthetic complications: no   No notable events documented.   Last Vitals:  Vitals:   04/16/22 1430 04/16/22 1445  BP: 100/62 115/61  Pulse:  78  Resp: (!) 21 (!) 26  Temp: 36.5 C   SpO2:  99%    Last Pain:  Vitals:   04/16/22 1430  TempSrc:   PainSc: 0-No pain                 Ilene Qua

## 2022-04-16 NOTE — Transfer of Care (Signed)
Immediate Anesthesia Transfer of Care Note  Patient: Hailey Wong  Procedure(s) Performed: ARTHROPLASTY BIPOLAR HIP (HEMIARTHROPLASTY) (Right: Hip)  Patient Location: PACU  Anesthesia Type:MAC and Spinal  Level of Consciousness: awake, alert , and oriented  Airway & Oxygen Therapy: Patient Spontanous Breathing  Post-op Assessment: Report given to RN and Post -op Vital signs reviewed and stable  Post vital signs: Reviewed and stable  Last Vitals:  Vitals Value Taken Time  BP    Temp    Pulse    Resp    SpO2      Last Pain:  Vitals:   04/15/22 2212  TempSrc: Oral  PainSc:          Complications: No notable events documented.

## 2022-04-16 NOTE — Consult Note (Signed)
ORTHOPAEDIC CONSULTATION  REQUESTING PHYSICIAN: Loletha Grayer, MD  Chief Complaint: Right hip pain  HPI: Hailey Wong is a 76 y.o. female who complains of right hip pain after a fall 2 days ago at home.  He was going up her garage steps.  He stayed in bed overnight but the pain was severe yesterday and she came to the [facility] walk-in clinic where x-rays revealed a displaced subcapital fracture of the right hip.  She was referred to the emergency room and admitted for medical workup and surgery.  Patient's health is good in general.  She is not on any blood thinners.  I fixed her ankle 30 years ago.  Weak discussed operative procedure and postop protocols with the patient and with her husband by phone.  They wish to proceed with surgery.  Risks and benefits were discussed as well.  Will proceed shortly with hemiarthroplasty of the right hip.  Past Medical History:  Diagnosis Date   Chronic cough    History of COVID-19    04/2021   Hypertension    Hypothyroidism    Mild cardiomegaly    Sinus trouble    Sleep apnea    no CPAP   Past Surgical History:  Procedure Laterality Date   BROW LIFT Bilateral 12/13/2019   Procedure: BLEPHAROPLASTY UPPER EYELID; W/EXCESS SKIN BLEPHAROPTOSIS REPAIR; RESECT EX;  Surgeon: Karle Starch, MD;  Location: [place];  Service: Ophthalmology;  Laterality: Bilateral;   CATARACT EXTRACTION W/PHACO Right 07/21/2021   Procedure: CATARACT EXTRACTION PHACO AND INTRAOCULAR LENS PLACEMENT ([place]) RIGHT 12.60 01:30.4;  Surgeon: Leandrew Koyanagi, MD;  Location: [place];  Service: Ophthalmology;  Laterality: Right;   CATARACT EXTRACTION W/PHACO Left 08/04/2021   Procedure: CATARACT EXTRACTION PHACO AND INTRAOCULAR LENS PLACEMENT ([facility]) LEFT 6.26 01:02.6;  Surgeon: Leandrew Koyanagi, MD;  Location: [place];  Service: Ophthalmology;  Laterality: Left;   COLONOSCOPY WITH PROPOFOL N/A 03/02/2021   Procedure: COLONOSCOPY WITH  PROPOFOL;  Surgeon: Jonathon Bellows, MD;  Location[facility] END[facility]COPY;  Service: Gastroenterology;  Laterality: N/A;   right ankle surgery  1997   THYROIDECTOMY Bilateral 10/20/2020   Procedure: THYROIDECTOMY;  Surgeon: Beverly Gust, MD;  Location: [facility];  Service: ENT;  Laterality: Bilateral;   TUBAL LIGATION  1977   Social History   Socioeconomic History   Marital status: Married    Spouse name: Not on file   Number of children: Not on file   Years of education: Not on file   Highest education level: Not on file  Occupational History   Not on file  Tobacco Use   Smoking status: Every Day    Packs/day: 1.00    Years: 50.00    Total pack years: 50.00    Types: Cigarettes   Smokeless tobacco: Never   Tobacco comments:    1 pack and a half daily  Vaping Use   Vaping Use: Never used  Substance and Sexual Activity   Alcohol use: Yes    Alcohol/week: 7.0 standard drinks of alcohol    Types: 7 Glasses of wine per week    Comment: wine daily   Drug use: Never   Sexual activity: Not on file  Other Topics Concern   Not on file  Social History Narrative   Not on file   Social Determinants of Health   Financial Resource Strain: Not on file  Food Insecurity: No Food Insecurity (04/16/2022)   Hunger Vital Sign    Worried About Running Out of Food  in the Last Year: Never true    [place] in the Last Year: Never true  Transportation Needs: No Transportation Needs (04/16/2022)   PRAPARE - Hydrologist (Medical): No    Lack of Transportation (Non-Medical): No  Physical Activity: Not on file  Stress: Not on file  Social Connections: Not on file   Family History  Problem Relation Age of Onset   Hypertension Mother    Diabetes Mother    Atrial fibrillation Mother    Hypertension Father    Pulmonary fibrosis Father    Breast cancer Sister    No Known Allergies Prior to Admission medications   Medication Sig Start Date End Date Taking?  Authorizing Provider  ASPIRIN 81 PO Take 81 mg by mouth 3 (three) times a week.   Yes [provider]  calcium carbonate (OS-CAL) 600 MG TABS tablet Take 600 mg by mouth 2 (two) times daily with a meal.   Yes [provider]  Cholecalciferol (D3 ADULT PO) Take 1,000 Units by mouth daily.   Yes [provider]  hydrochlorothiazide (HYDRODIURIL) 12.5 MG tablet TAKE 1 TABLET BY MOUTH ONCE DAILY 03/28/22  Yes McDonough, Lauren K, PA-C  levothyroxine (SYNTHROID) 137 MCG tablet Take 137 mcg by mouth daily before breakfast.   Yes [provider]  lisinopril (ZESTRIL) 10 MG tablet TAKE 1 TABLET BY MOUTH TWICE DAILY 11/16/21  Yes Lavera Guise, MD  rosuvastatin (CRESTOR) 5 MG tablet TAKE 1 TABLET BY MOUTH ONCE EVERY EVENING 02/18/22  Yes McDonough, Lauren K, PA-C  Sennosides-Docusate Sodium 8.6-50 MG CAPS Take by mouth.   Yes [provider]  fluticasone (FLONASE) 50 MCG/ACT nasal spray Place 1 spray into both nostrils daily as needed for allergies or rhinitis.    [provider]  ibandronate (BONIVA) 150 MG tablet Take in the morning with a full glass of water, on an empty stomach, and do not take anything else by mouth or lie down for the next 30 min. 01/18/22   Lavera Guise, MD   CT Head Wo Contrast  Result Date: 04/15/2022 CLINICAL DATA:  Trauma, fall EXAM: CT HEAD WITHOUT CONTRAST TECHNIQUE: Contiguous axial images were obtained from the base of the skull through the vertex without intravenous contrast. RADIATION DOSE REDUCTION: This exam was performed according to the departmental dose-optimization program which includes automated exposure control, adjustment of the mA and/or kV according to patient size and/or use of iterative reconstruction technique. COMPARISON:  None Available. FINDINGS: Brain: No acute intracranial findings are seen. There are no signs of bleeding within the cranium. Cortical sulci are prominent. Ventricles are not dilated.  Vascular: Scattered arterial calcifications are seen. Skull: No fracture is seen in calvarium. Sinuses/Orbits: There are no air-fluid levels. Other: There is subcutaneous contusion/hematoma in the left periorbital region. IMPRESSION: No acute intracranial findings are seen in noncontrast CT brain. Atrophy. Electronically Signed   By: Elmer Picker M.D.   On: 04/15/2022 16:31   CT Cervical Spine Wo Contrast  Result Date: 04/15/2022 CLINICAL DATA:  Trauma, fall EXAM: CT CERVICAL SPINE WITHOUT CONTRAST TECHNIQUE: Multidetector CT imaging of the cervical spine was performed without intravenous contrast. Multiplanar CT image reconstructions were also generated. RADIATION DOSE REDUCTION: This exam was performed according to the departmental dose-optimization program which includes automated exposure control, adjustment of the mA and/or kV according to patient size and/or use of iterative reconstruction technique. COMPARISON:  None Available. FINDINGS: Alignment: There is minimal anterolisthesis at  C3-C4 and C5-C6 levels, possibly suggesting remote ligament injury and facet degeneration. Skull base and vertebrae: No recent fracture is seen. Degenerative changes are noted, more so at C6-C7 level. Soft tissues and spinal canal: There is no central spinal stenosis Disc levels: There is encroachment of neural foramina from C3 to C7 levels. Upper chest: Unremarkable. Other: None. IMPRESSION: No recent fracture is seen in cervical spine. Cervical spondylosis with encroachment of neural foramina at multiple levels. Electronically Signed   By: Elmer Picker M.D.   On: 04/15/2022 16:27   CT Maxillofacial WO CM  Result Date: 04/15/2022 CLINICAL DATA:  Trauma EXAM: CT MAXILLOFACIAL WITHOUT CONTRAST TECHNIQUE: Multidetector CT imaging of the maxillofacial structures was performed. Multiplanar CT image reconstructions were also generated. RADIATION DOSE REDUCTION: This exam was performed according to the  departmental dose-optimization program which includes automated exposure control, adjustment of the mA and/or kV according to patient size and/or use of iterative reconstruction technique. COMPARISON:  None Available. FINDINGS: Osseous: No recent fracture is seen. There is cortical thickening in the wall of left maxillary sinus. Findings may be residual from previous injury or suggest Paget's disease. Orbits: Optic globes are symmetrical. Retrobulbar soft tissues are unremarkable. There is no evidence of blowout fracture. Sinuses: There are no air-fluid levels. There is mild mucosal thickening in ethmoid and maxillary sinuses. There is minimal mucosal thickening in sphenoid sinus. There is mild deviation of nasal septum to the right. Soft tissues: There is subcutaneous contusion/hematoma in the left periorbital region. Limited intracranial: Unremarkable. IMPRESSION: No recent fracture is seen in facial bones. No focal abnormalities are seen in the orbits. There are no air-fluid levels in paranasal sinuses. Chronic sinusitis. Electronically Signed   By: Elmer Picker M.D.   On: 04/15/2022 16:23   DG Hip Unilat W or Wo Pelvis 2-3 Views Right  Result Date: 04/15/2022 CLINICAL DATA:  Fall. EXAM: DG HIP (WITH OR WITHOUT PELVIS) 2-3V RIGHT COMPARISON:  None Available. FINDINGS: Severely displaced proximal right femoral neck fracture is noted. IMPRESSION: Severely displaced proximal right femoral neck fracture. Electronically Signed   By: Marijo Conception M.D.   On: 04/15/2022 15:10   DG Chest 2 View  Result Date: 04/15/2022 CLINICAL DATA:  Fall. EXAM: CHEST - 2 VIEW COMPARISON:  November 26, 2019. FINDINGS: Stable cardiomediastinal silhouette. Both lungs are clear. The visualized skeletal structures are unremarkable. IMPRESSION: No active cardiopulmonary disease. Electronically Signed   By: Marijo Conception M.D.   On: 04/15/2022 15:09    Positive ROS: All other systems have been reviewed and were otherwise  negative with the exception of those mentioned in the HPI and as above.  Physical Exam: General: Alert, no acute distress Cardiovascular: No pedal edema Respiratory: No cyanosis, no use of accessory musculature GI: No organomegaly, abdomen is soft and non-tender Skin: No lesions in the area of chief complaint Neurologic: Sensation intact distally Psychiatric: Patient is competent for consent with normal mood and affect Lymphatic: No axillary or cervical lymphadenopathy  MUSCULOSKELETAL: Patient is alert and awake and fully oriented.  The right leg is shortened and externally rotated.  There is a small bruise over the patella.  She has pain with movement of the right hip.  Neurovascular status of it.  Ankle and toe motion is good.  Left leg is unremarkable.  Back and upper extremities are normal.    Assessment: Displaced subcapital fracture right hip  Plan: Hemiarthroplasty right hip.    Park Breed, MD [phone]   04/16/2022 11:05  AM

## 2022-04-16 NOTE — Anesthesia Preprocedure Evaluation (Addendum)
Anesthesia Evaluation  Patient identified by MRN, date of birth, ID band Patient awake    Reviewed: Allergy & Precautions, NPO status , Patient's Chart, lab work & pertinent test results  History of Anesthesia Complications Negative for: history of anesthetic complications  Airway Mallampati: II  TM Distance: >3 FB Neck ROM: full    Dental no notable dental hx.    Pulmonary sleep apnea , Current Smoker and Patient abstained from smoking.   Pulmonary exam normal        Cardiovascular hypertension, On Medications Normal cardiovascular exam     Neuro/Psych negative neurological ROS  negative psych ROS   GI/Hepatic negative GI ROS, Neg liver ROS,,,  Endo/Other  Hypothyroidism    Renal/GU      Musculoskeletal   Abdominal   Peds  Hematology negative hematology ROS (+)   Anesthesia Other Findings Past Medical History: No date: Chronic cough No date: History of COVID-19     Comment:  04/2021 No date: Hypertension No date: Hypothyroidism No date: Mild cardiomegaly No date: Sinus trouble No date: Sleep apnea     Comment:  no CPAP  Past Surgical History: 12/13/2019: BROW LIFT; Bilateral     Comment:  Procedure: BLEPHAROPLASTY UPPER EYELID; W/EXCESS SKIN               BLEPHAROPTOSIS REPAIR; RESECT EX;  Surgeon: Karle Starch, MD;  Location: [place];  Service:               Ophthalmology;  Laterality: Bilateral; 07/21/2021: CATARACT EXTRACTION W/PHACO; Right     Comment:  Procedure: CATARACT EXTRACTION PHACO AND INTRAOCULAR               LENS PLACEMENT ([place]) RIGHT 12.60 01:30.4;  Surgeon:               Leandrew Koyanagi, MD;  Location: [place];              Service: Ophthalmology;  Laterality: Right; 08/04/2021: CATARACT EXTRACTION W/PHACO; Left     Comment:  Procedure: CATARACT EXTRACTION PHACO AND INTRAOCULAR               LENS PLACEMENT ([facility]) LEFT 6.26 01:02.6;  Surgeon:                Leandrew Koyanagi, MD;  Location: [place];              Service: Ophthalmology;  Laterality: Left; 03/02/2021: COLONOSCOPY WITH PROPOFOL; N/A     Comment:  Procedure: COLONOSCOPY WITH PROPOFOL;  Surgeon: Jonathon Bellows, MD;  Location: [facility] ENDOSCOPY;  Service:               Gastroenterology;  Laterality: N/A; 1997: right ankle surgery 10/20/2020: THYROIDECTOMY; Bilateral     Comment:  Procedure: THYROIDECTOMY;  Surgeon: Beverly Gust,               MD;  Location: [facility];  Service: ENT;  Laterality:               Bilateral; 1977: TUBAL LIGATION  BMI    Body Mass Index: 28.74 kg/m      Reproductive/Obstetrics negative OB ROS  Anesthesia Physical Anesthesia Plan  ASA: 2  Anesthesia Plan: Spinal   Post-op Pain Management:    Induction:   PONV Risk Score and Plan:   Airway Management Planned: Natural Airway and Nasal Cannula  Additional Equipment:   Intra-op Plan:   Post-operative Plan:   Informed Consent: I have reviewed the patients History and Physical, chart, labs and discussed the procedure including the risks, benefits and alternatives for the proposed anesthesia with the patient or authorized representative who has indicated his/her understanding and acceptance.     Dental Advisory Given  Plan Discussed with: Anesthesiologist, CRNA and Surgeon  Anesthesia Plan Comments: (Patient reports no bleeding problems and no anticoagulant use.  Plan for spinal with backup GA  Patient consented for risks of anesthesia including but not limited to:  - adverse reactions to medications - damage to eyes, teeth, lips or other oral mucosa - nerve damage due to positioning  - risk of bleeding, infection and or nerve damage from spinal that could lead to paralysis - risk of headache or failed spinal - damage to teeth, lips or other oral mucosa - sore throat or hoarseness - damage to  heart, brain, nerves, lungs, other parts of body or loss of life  Patient voiced understanding.)        Anesthesia Quick Evaluation

## 2022-04-16 NOTE — ED Notes (Signed)
Request made for transport to the floor ?

## 2022-04-16 NOTE — Progress Notes (Signed)
Progress Note   Patient: Hailey Wong YYT:[id number] DOB: 1945-05-03 DOA: 04/15/2022     1 DOS: the patient was seen and examined on 04/16/2022   Brief hospital course: 76 y.o. female with medical history significant of hypertension, hyperlipidemia, smoking and osteopenia.  She was in the garage yesterday evening and tripped up a step and had a fall and hit her head on the step.  Her husband got her up and mated to the recliner.  10 AM this morning she needed to be evaluated because she could not get up secondary to pain in her hip.  She went to [facility] and was diagnosed with a right hip fracture and sent into the emergency room for further evaluation.   12/23.  Patient to the operating room for repair of right hip fracture.  Will get overnight oximetry to see if she qualifies for nocturnal oxygen.  Recommend discontinue IV fluids after surgery.  Assessment and Plan: * Closed right hip fracture ([place]) To the operating room today for hip repair.  Hyponatremia Likely secondary to hydrochlorothiazide.  Discontinue hydrochlorothiazide.  Fluid restrict.  Urine sodium and urine osmolarity ordered.  Recommend discontinue IV fluids after surgery.  Hyperlipidemia Continue Crestor  Essential hypertension Continue lisinopril, hold hydrochlorothiazide  Hypokalemia Replace potassium orally.  Osteopenia Resume Boniva as outpatient.  Sleep apnea Patient does not wear a CPAP.  Given nocturnal pulse ox to see if she qualifies for nocturnal oxygen.  Tobacco abuse Patient not interested in nicotine patch at this point.  Hypothyroidism Continue levothyroxine.  TSH normal range.        Subjective: Patient seen this morning and she had oxygen on.  It was placed on because of desaturations last night.  She states that she has sleep apnea but does not wear a CPAP.  Still has pain in the right hip.  Physical Exam: Vitals:   04/16/22 0057 04/16/22 0100 04/16/22 0146 04/16/22 0941  BP:  119/69 120/70 126/75 (!) 154/77  Pulse: 80 86 91 78  Resp: (!) 177  18 17  Temp:   98.4 F (36.9 C) 98.2 F (36.8 C)  TempSrc:      SpO2: 93% 94% 99% 100%  Weight:   69 kg   Height:   '5\' 1"'$  (1.549 m)    Physical Exam HENT:     Head: Normocephalic.     Mouth/Throat:     Pharynx: No oropharyngeal exudate.  Eyes:     General: Lids are normal.     Conjunctiva/sclera: Conjunctivae normal.  Cardiovascular:     Rate and Rhythm: Normal rate and regular rhythm.     Heart sounds: Normal heart sounds, S1 normal and S2 normal.  Pulmonary:     Breath sounds: No decreased breath sounds, wheezing, rhonchi or rales.  Abdominal:     Palpations: Abdomen is soft.     Tenderness: There is no abdominal tenderness.  Musculoskeletal:     Right lower leg: No swelling.     Left lower leg: No swelling.  Skin:    General: Skin is warm.     Findings: No rash.     Comments: Bruising around left eye  Neurological:     Mental Status: She is alert and oriented to person, place, and time.     Comments: Able to wiggle toes bilaterally.     Data Reviewed: Sodium 129, creatinine 0.53, white blood cell count 11.6, hemoglobin 12.7  Disposition: Status is: Inpatient Remains inpatient appropriate because: In the OR for hip  fracture today.  Planned Discharge Destination: I suspect the patient will need rehab    Time spent: 27 minutes  Author: Loletha Grayer, MD 04/16/2022 1:13 PM  For on call review [email or web address].

## 2022-04-16 NOTE — Anesthesia Procedure Notes (Signed)
Spinal  Patient location during procedure: OR Start time: 04/16/2022 11:30 AM Reason for block: surgical anesthesia Staffing Performed: resident/CRNA  Resident/CRNA: Ladaysha Soutar, Einar Grad, CRNA Performed by: Chanetta Marshall, CRNA Authorized by: Ilene Qua, MD   Preanesthetic Checklist Completed: patient identified, IV checked, site marked, risks and benefits discussed, surgical consent, monitors and equipment checked, pre-op evaluation and timeout performed Spinal Block Patient position: sitting Prep: DuraPrep Patient monitoring: heart rate, cardiac monitor, continuous pulse ox and blood pressure Approach: midline Location: L3-4 Injection technique: single-shot Needle Needle type: Sprotte  Needle gauge: 24 G Needle length: 9 cm Assessment Sensory level: T6 Events: CSF return Additional Notes - parathesia, - heme, clear CSF w/ brisk flow, T6 level achieved.

## 2022-04-17 DIAGNOSIS — I10 Essential (primary) hypertension: Secondary | ICD-10-CM | POA: Diagnosis not present

## 2022-04-17 DIAGNOSIS — E871 Hypo-osmolality and hyponatremia: Secondary | ICD-10-CM | POA: Diagnosis not present

## 2022-04-17 DIAGNOSIS — S72001S Fracture of unspecified part of neck of right femur, sequela: Secondary | ICD-10-CM | POA: Diagnosis not present

## 2022-04-17 DIAGNOSIS — E785 Hyperlipidemia, unspecified: Secondary | ICD-10-CM | POA: Diagnosis not present

## 2022-04-17 DIAGNOSIS — G4734 Idiopathic sleep related nonobstructive alveolar hypoventilation: Secondary | ICD-10-CM | POA: Insufficient documentation

## 2022-04-17 LAB — BASIC METABOLIC PANEL
Anion gap: 9 (ref 5–15)
BUN: 9 mg/dL (ref 8–23)
CO2: 23 mmol/L (ref 22–32)
Calcium: 8.2 mg/dL — ABNORMAL LOW (ref 8.9–10.3)
Chloride: 98 mmol/L (ref 98–111)
Creatinine, Ser: 0.42 mg/dL — ABNORMAL LOW (ref 0.44–1.00)
GFR, Estimated: >60 mL/min (ref >60)
Glucose, Bld: 122 mg/dL — ABNORMAL HIGH (ref 70–99)
Potassium: 4.2 mmol/L (ref 3.5–5.1)
Sodium: 130 mmol/L — ABNORMAL LOW (ref 135–145)

## 2022-04-17 LAB — CBC
HCT: 32.8 % — ABNORMAL LOW (ref 36.0–46.0)
Hemoglobin: 11.3 g/dL — ABNORMAL LOW (ref 12.0–15.0)
MCH: 30.6 pg (ref 26.0–34.0)
MCHC: 34.5 g/dL (ref 30.0–36.0)
MCV: 88.9 fL (ref 80.0–100.0)
Platelets: 192 10*3/uL (ref 150–400)
RBC: 3.69 MIL/uL — ABNORMAL LOW (ref 3.87–5.11)
RDW: 15 % (ref 11.5–15.5)
WBC: 8.3 10*3/uL (ref 4.0–10.5)
nRBC: 0 % (ref 0.0–0.2)

## 2022-04-17 NOTE — Plan of Care (Signed)
  Problem: Education: Goal: Knowledge of General Education information will improve Description: Including pain rating scale, medication(s)/side effects and non-pharmacologic comfort measures Outcome: Progressing   Problem: Nutrition: Goal: Adequate nutrition will be maintained Outcome: Progressing   Problem: Elimination: Goal: Will not experience complications related to bowel motility Outcome: Progressing   Problem: Pain Managment: Goal: General experience of comfort will improve Outcome: Progressing   Problem: Coping: Goal: Level of anxiety will decrease Outcome: Progressing   Problem: Activity: Goal: Ability to ambulate and perform ADLs will improve Outcome: Progressing   Problem: Education: Goal: Verbalization of understanding the information provided (i.e., activity precautions, restrictions, etc) will improve Outcome: Progressing

## 2022-04-17 NOTE — TOC Progression Note (Signed)
Transition of Care [facility]) - Progression Note    Patient Details  Name: Hailey Wong MRN: [id number] Date of Birth: April 11, 1946  [facility] CM/SW Contact  Izola Price, RN Phone Number: 04/17/2022, 1:23 PM  Clinical Narrative: 12/24: Damaris Schooner with patient regarding [facility] PT potential depending on progress next day or two per PT notes. No preference on that or DME company for RW, BSC, or nocturnal oxygen orders. [employer] notified of all but patient may have access to RW and [facility] but needs to check. [facility] via Tommi Rumps B accepted for tentative [facility] placement pending final disposition/orders. Simmie Davies RN CM            Expected Discharge Plan and Services                                               Social Determinants of Health (SDOH) Interventions SDOH Screenings   Food Insecurity: No Food Insecurity (04/16/2022)  Housing: Low Risk  (04/16/2022)  Transportation Needs: No Transportation Needs (04/16/2022)  Utilities: Not At Risk (04/16/2022)  Alcohol Screen: Low Risk  (01/18/2022)  Depression (PHQ2-9): Low Risk  (01/18/2022)  Tobacco Use: High Risk (04/15/2022)    Readmission Risk Interventions     No data to display

## 2022-04-17 NOTE — Progress Notes (Signed)
Subjective: 1 Day Post-Op Procedure(s) (LRB): ARTHROPLASTY BIPOLAR HIP (HEMIARTHROPLASTY) (Right) Patient is out of bed sitting in chair.  She is eating lunch.  She is alert and cooperative.  She started PT this morning.  Pain is mild.  Hemoglobin is stable at 11.3.  Sodium is remaining stable.  Patient reports pain as mild.  Objective:   VITALS:   Vitals:   04/17/22 0548 04/17/22 0759  BP: 133/77 138/72  Pulse: 83 83  Resp: 16 17  Temp: 98.5 F (36.9 C) 98.5 F (36.9 C)  SpO2: 98% 97%    Neurologically intact Incision: dressing C/D/I  LABS Recent Labs    04/15/22 1530 04/16/22 0526 04/17/22 0433  HGB 13.8 12.7 11.3*  HCT 39.9 37.0 32.8*  WBC 12.4* 11.6* 8.3  PLT 273 244 192    Recent Labs    04/15/22 1737 04/16/22 0526 04/17/22 0433  NA 128* 129* 130*  K 3.3* 4.2 4.2  BUN '14 13 9  '$ CREATININE 0.63 0.53 0.42*  GLUCOSE 191* 140* 122*    Recent Labs    04/15/22 2020  INR 1.1     Assessment/Plan: 1 Day Post-Op Procedure(s) (LRB): ARTHROPLASTY BIPOLAR HIP (HEMIARTHROPLASTY) (Right)   Advance diet Up with therapy D/C IV fluids Discharge to SNF if necessary.

## 2022-04-17 NOTE — Evaluation (Signed)
Physical Therapy Evaluation Patient Details Name: Hailey Wong MRN: [id number] DOB: 1945-11-20 Today's Date: 04/17/2022  History of Present Illness  Hailey Wong is a 76 y.o. female with medical history significant of hypertension, hyperlipidemia, smoking and osteopenia.  She was in the garage and tripped up a step and had a fall and hit her head on the step.  Her husband got her up and to the recliner.  The next day she went to [facility] because she could not get up due to R hip pain. She was diagnosed with Displaced Subcapital fracture right hip and sent into the emergency room where she was admitted. She underwent Right   hip hemiarthroplasty with [employer] Accolade prosthesis on 04/16/2022. She was made PWB to 25% in chart. Dr. Sabra Heck said to PT via instant message on 04/17/22 that she could be allowed to have more weight bearing if needed for function but try to minimize weight bearing as much as possible.   Clinical Impression  Patient alert and oriented and already up in chair upon arrival. Patient reports she lives with her husband in a home with a sunken living room where she sleeps in her recliner at baseline. She has 1-3 steps to enter the home with B handrails on the three steps. There are 2 steps with no handrails to get to/from living room. Prior to hospitalization she was I with all aspects of care and mobility without using an assistive device. She has had two falls in the last 6 months, one in October where she got her feet tangled in the mop while mopping and the fall that lead to her hospitalization where she tripped going up the stairs in her garage. Upon PT evaluation, patient's bed mobility was not assessed since she was already up in the chair and she does not sleep in a bed at baseline. She was able to transfer and ambulate ~ 85 feet with RW and supervision to CGA with hopping step pattern due to minimizing weight bearing on R LE. Patient was quite fatigued after ambulation and  requested to attempt stairs at a later time. She was able to transfer to/from chair and BSC placed over commode without physical assistance, and she was able to complete pericare and hand washing without LOB. Patient's home set up is challenging with the additional steps inside the home but she appears to be likely to progress to able to go home with PT in the acute care setting over the next couple of days. PT currently recommends HHPT on discharge pending progress. She needs a RW for ambulation and a BSC due to not being able to get to the bathroom safely from her recliner in the living room. Patient would benefit from skilled physical therapy to address impairments and functional limitations (see PT Problem List below) to work towards stated goals and return to PLOF or maximal functional independence.       Recommendations for follow up therapy are one component of a multi-disciplinary discharge planning process, led by the attending physician.  Recommendations may be updated based on patient status, additional functional criteria and insurance authorization.  Follow Up Recommendations Home health PT (pending expected progression in functional independence over the next couple of days.)      Assistance Recommended at Discharge Intermittent Supervision/Assistance  Patient can return home with the following  A little help with walking and/or transfers;Assistance with cooking/housework;Assist for transportation;A little help with bathing/dressing/bathroom;Help with stairs or ramp for entrance    Equipment Recommendations Rolling  walker (2 wheels);BSC/3in1  Recommendations for Other Services  OT consult    Functional Status Assessment Patient has had a recent decline in their functional status and demonstrates the ability to make significant improvements in function in a reasonable and predictable amount of time.     Precautions / Restrictions Precautions Precautions: Posterior Hip Precaution  Booklet Issued: No Precaution Comments: verbally educated patient on precautions, no R hip flexion past 90, IR or adduction past neutral. Restrictions Weight Bearing Restrictions: Yes RLE Weight Bearing: Partial weight bearing RLE Partial Weight Bearing Percentage or Pounds: Chart order states PWB to 25%. Instant message instructions from Dr. Sabra Heck to PT on 04/17/22: she could be allowed to have more weight bearing if needed for function but try to minimize weight bearing as much as possible      Mobility  Bed Mobility               General bed mobility comments: bed mobility not observed by PT. Patient already up in chair with Nursing assistance. Patient sleeps in recliner at baseline due to difficulty breathing at night.    Transfers Overall transfer level: Needs assistance   Transfers: Sit to/from Stand Sit to Stand: Supervision           General transfer comment: Patient completed sit <> stand transfer with RW from chair <> BSC over toilet. She needed cuing for sequencing, correct body/AD placement but no physical assist.    Ambulation/Gait Ambulation/Gait assistance: Supervision, Min guard Gait Distance (Feet): 85 Feet Assistive device: Rolling walker (2 wheels) Gait Pattern/deviations: Step-to pattern, Decreased step length - left, Decreased dorsiflexion - right, Decreased weight shift to right, Knee flexed in stance - right Gait velocity: very slow     General Gait Details: Patient ambulated approximately 85 feet continuously with RW and CGA-SBA with minimal weight bearing on R LE with pressure through ball of foot. She also ambulated approx 20 feet from bed to bathroom with same assistance. Patient required cuning for safe body/AD placement and to maintain weight bearing restrictions.  Stairs            Wheelchair Mobility    Modified Rankin (Stroke Patients Only)       Balance Overall balance assessment: Needs assistance Sitting-balance support:  No upper extremity supported, Feet supported Sitting balance-Leahy Scale: Good       Standing balance-Leahy Scale: Fair Standing balance comment: Patient able to take hands of RW and manage underclothing and/or wash hands associated with toileting, but she was reliant on B UE support with RW for ambulation.                             Pertinent Vitals/Pain Pain Assessment Pain Assessment: No/denies pain    Home Living Family/patient expects to be discharged to:: Private residence Living Arrangements: Spouse/significant other Available Help at Discharge: Available 24 hours/day;Family (husband likes to be in and out) Type of Home: House Home Access: Stairs to enter   [employer] of Steps: 1 step in front with no handrails. 3 steps with B handrails in the back where she can reach both handrails. Alternate Level Stairs-Number of Steps: Patient has a sunken living room with two steps to enter/exit with no handrails (but doorways she can grab). She sleeps in the living room in a recliner but the bathroom is on the main floor. Home Layout: Multi-level   Additional Comments: Patient states she has a walker in her storage  building. Her freind has a cane and RW that she can use. She has a w/c but someone else is using it currently.    Prior Function Prior Level of Function : Independent/Modified Independent;Driving;History of Falls (last six months)             Mobility Comments: Patient was I with all aspects of care and mobility. She reports two falls in the last 6 months. In October she got her feet tangled in the mop while mopping and she fell up the steps leading to current hospitalization. ADLs Comments: Patient was I with all aspects of care.     Hand Dominance        Extremity/Trunk Assessment   Upper Extremity Assessment Upper Extremity Assessment: Overall WFL for tasks assessed    Lower Extremity Assessment Lower Extremity Assessment: Overall WFL  for tasks assessed;RLE deficits/detail RLE Deficits / Details: R hip pain and limitations due to post-op weight bearing precautions.    Cervical / Trunk Assessment Cervical / Trunk Assessment: Normal  Communication   Communication: No difficulties  Cognition Arousal/Alertness: Awake/alert Behavior During Therapy: WFL for tasks assessed/performed Overall Cognitive Status: Within Functional Limits for tasks assessed                                          General Comments      Exercises Other Exercises Other Exercises: educated patient about role of PT in acute care setting, discharge reccomendations.   Assessment/Plan    PT Assessment Patient needs continued PT services  PT Problem List Decreased strength;Decreased balance;Decreased knowledge of precautions;Pain;Decreased range of motion;Decreased mobility;Decreased knowledge of use of DME;Decreased activity tolerance;Decreased coordination;Decreased skin integrity       PT Treatment Interventions DME instruction;Functional mobility training;Balance training;Patient/family education;Gait training;Therapeutic activities;Neuromuscular re-education;Stair training;Therapeutic exercise    PT Goals (Current goals can be found in the Care Plan section)  Acute Rehab PT Goals Patient Stated Goal: to get better PT Goal Formulation: With patient Time For Goal Achievement: 05/01/22 Potential to Achieve Goals: Good    Frequency BID     Co-evaluation               AM-PAC PT "6 Clicks" Mobility  Outcome Measure Help needed turning from your back to your side while in a flat bed without using bedrails?: A Little Help needed moving from lying on your back to sitting on the side of a flat bed without using bedrails?: A Little Help needed moving to and from a bed to a chair (including a wheelchair)?: A Little Help needed standing up from a chair using your arms (e.g., wheelchair or bedside chair)?: A Little Help  needed to walk in hospital room?: A Little Help needed climbing 3-5 steps with a railing? : A Lot 6 Click Score: 17    End of Session Equipment Utilized During Treatment: Gait belt Activity Tolerance: Patient tolerated treatment well;Patient limited by fatigue Patient left: in chair;with chair alarm set;with call bell/phone within reach Nurse Communication: Mobility status PT Visit Diagnosis: Unsteadiness on feet (R26.81);History of falling (Z91.81);Difficulty in walking, not elsewhere classified (R26.2)    Time: 4403-4742 PT Time Calculation (min) (ACUTE ONLY): 44 min   Charges:   PT Evaluation $PT Eval Moderate Complexity: 1 Mod PT Treatments $Gait Training: 8-22 mins       Everlean Alstrom. Graylon Good, PT, DPT 04/17/22, 12:08 PM

## 2022-04-17 NOTE — TOC Initial Note (Addendum)
Transition of Care [facility]) - Initial/Assessment Note    Patient Details  Name: Hailey Wong MRN: [id number] Date of Birth: 07/29/1945  Transition of Care [facility]) CM/SW Contact:    Izola Price, RN Phone Number: 04/17/2022, 11:43 AM  Clinical Narrative:12/23 Admitted 12/22. POD #1 for repair of right hip fracture related to a fall at home. Spouse brought to [facility] ED from next morning when she could not get up due pain in hip area. Post-op evals pending. Nocturnal oximetry overnight prior to surgery indicate need for nocturnal oxygen per hospitalist provider and DME orders placed. Results on hard chart. To be scanned in or faxed to [facility] and then will contact [employer]. Contacted [employer] and now pending results but will start order for potential discharge tomorrow or for home set up. Simmie Davies RN CM    Nash Mantis (Spouse)  [phone] (Mobile)  [place], [place]. [place]  PCP: Lavera Guise [[id number]]  Phone: [phone] Fax: [phone]             Simmie Davies RN CM        Patient Goals and CMS Choice            Expected Discharge Plan and Services                                              Prior Living Arrangements/Services                       Activities of Daily Living Home Assistive Devices/Equipment: None ADL Screening (condition at time of admission) Patient's cognitive ability adequate to safely complete daily activities?: Yes Is the patient deaf or have difficulty hearing?: No Does the patient have difficulty seeing, even when wearing glasses/contacts?: No Does the patient have difficulty concentrating, remembering, or making decisions?: No Patient able to express need for assistance with ADLs?: No Does the patient have difficulty dressing or bathing?: No Independently performs ADLs?: No Communication: Independent Dressing (OT): Independent Grooming: Independent Feeding: Independent Bathing:  Independent Toileting: Independent In/Out Bed: Independent Walks in Home: Independent Does the patient have difficulty walking or climbing stairs?: No Weakness of Legs: Right Weakness of Arms/Hands: None  Permission Sought/Granted                  Emotional Assessment              Admission diagnosis:  Hip fracture ([place]) [S72.009A] Closed fracture of right hip, initial encounter ([place]) [S72.001A] Patient Active Problem List   Diagnosis Date Noted   Sleep apnea 04/16/2022   Closed right hip fracture ([place]) 04/15/2022   Hyponatremia 04/15/2022   Hyperlipidemia 04/15/2022   Hypokalemia 04/15/2022   Osteopenia 04/15/2022   Hypothyroidism 04/15/2022   Tobacco abuse 04/15/2022   S/P total thyroidectomy 10/20/2020   Loud snoring 12/20/2018   Dyspnea on exertion 09/03/2018   Cigarette smoker 09/03/2018   Epistaxis 08/14/2018   Screening for breast cancer 11/09/2017   Essential hypertension 11/09/2017   Localized superficial swelling, mass, or lump 11/09/2017   Vitamin D deficiency 11/09/2017   Dysuria 11/09/2017   Closed fracture of clavicle 08/18/2016   PCP:  Lavera Guise, MD Pharmacy:   [facility], [address] [place] [address] Phone: [phone] Fax: [phone]  Social Determinants of Health (SDOH) Social History: SDOH Screenings   Food Insecurity: No Food Insecurity (04/16/2022)  Housing: Low Risk  (04/16/2022)  Transportation Needs: No Transportation Needs (04/16/2022)  Utilities: Not At Risk (04/16/2022)  Alcohol Screen: Low Risk  (01/18/2022)  Depression (PHQ2-9): Low Risk  (01/18/2022)  Tobacco Use: High Risk (04/15/2022)   SDOH Interventions:     Readmission Risk Interventions     No data to display

## 2022-04-17 NOTE — Assessment & Plan Note (Signed)
Qualifies for nocturnal oxygen

## 2022-04-17 NOTE — Progress Notes (Signed)
Progress Note   Patient: Hailey Wong WEX:[id number] DOB: 20-Nov-1945 DOA: 04/15/2022     2 DOS: the patient was seen and examined on 04/17/2022   Brief hospital course: 76 y.o. female with medical history significant of hypertension, hyperlipidemia, smoking and osteopenia.  She was in the garage yesterday evening and tripped up a step and had a fall and hit her head on the step.  Her husband got her up and mated to the recliner.  10 AM this morning she needed to be evaluated because she could not get up secondary to pain in her hip.  She went to [facility] and was diagnosed with a right hip fracture and sent into the emergency room for further evaluation.   12/23.  Patient to the operating room for repair of right hip fracture.  Will get overnight oximetry to see if she qualifies for nocturnal oxygen.  Recommend discontinue IV fluids after surgery. 12/24.  Patient qualifies for nocturnal oxygen with overnight oximetry results in chart.  Seen walking in hallway with physical therapist.  Assessment and Plan: * Closed right hip fracture ([place]) Postoperative day 1 for right hemiarthroplasty.  Patient not in much pain.  Walked with physical therapy.  Hyponatremia Likely secondary to hydrochlorothiazide.  Discontinue hydrochlorothiazide.  Fluid restrict.  Sodium 130 today.  Hyperlipidemia Continue Crestor  Essential hypertension Continue lisinopril, hold hydrochlorothiazide  Hypokalemia Replaced  Osteopenia Resume Boniva as outpatient.  Nocturnal hypoxia Qualifies for nocturnal oxygen  Sleep apnea Patient does not wear a CPAP.  Qualifies for nocturnal oxygen with overnight oximetry.  Tobacco abuse Patient not interested in nicotine patch at this point.  Hypothyroidism Continue levothyroxine.  TSH normal range.        Subjective: Patient feeling okay.  Postoperative day 1 for right hip hemiarthroplasty.  Came in with a hip fracture.  Feels better that she is sitting in the  chair.  Physical Exam: Vitals:   04/16/22 1750 04/16/22 2130 04/17/22 0548 04/17/22 0759  BP: 134/76 129/82 133/77 138/72  Pulse: 83 79 83 83  Resp: '17 16 16 17  '$ Temp: 98.2 F (36.8 C) 98.3 F (36.8 C) 98.5 F (36.9 C) 98.5 F (36.9 C)  TempSrc:  Oral Oral   SpO2: 100% 99% 98% 97%  Weight:      Height:       Physical Exam HENT:     Head: Normocephalic.     Mouth/Throat:     Pharynx: No oropharyngeal exudate.  Eyes:     General: Lids are normal.     Conjunctiva/sclera: Conjunctivae normal.  Cardiovascular:     Rate and Rhythm: Normal rate and regular rhythm.     Heart sounds: Normal heart sounds, S1 normal and S2 normal.  Pulmonary:     Breath sounds: No decreased breath sounds, wheezing, rhonchi or rales.  Abdominal:     Palpations: Abdomen is soft.     Tenderness: There is no abdominal tenderness.  Musculoskeletal:     Right lower leg: No swelling.     Left lower leg: No swelling.  Skin:    General: Skin is warm.     Findings: No rash.     Comments: Bruising around left eye  Neurological:     Mental Status: She is alert and oriented to person, place, and time.     Comments: Able to wiggle toes bilaterally.     Data Reviewed: Sodium 130, potassium 4.2, creatinine 0.42, hemoglobin 11.3  Family Communication: Tried to call husband but Research scientist (medical)  is full  Disposition: Status is: Inpatient Remains inpatient appropriate because: Postoperative day 1 right hemiarthroplasty  Planned Discharge Destination: Rehab versus home with home health depending on progress    Time spent: 27 minutes Case discussed with physical therapy  Author: Loletha Grayer, MD 04/17/2022 2:06 PM  For on call review [email or web address].

## 2022-04-18 DIAGNOSIS — E785 Hyperlipidemia, unspecified: Secondary | ICD-10-CM | POA: Diagnosis not present

## 2022-04-18 DIAGNOSIS — S72001S Fracture of unspecified part of neck of right femur, sequela: Secondary | ICD-10-CM | POA: Diagnosis not present

## 2022-04-18 DIAGNOSIS — E871 Hypo-osmolality and hyponatremia: Secondary | ICD-10-CM | POA: Diagnosis not present

## 2022-04-18 DIAGNOSIS — I10 Essential (primary) hypertension: Secondary | ICD-10-CM | POA: Diagnosis not present

## 2022-04-18 LAB — BASIC METABOLIC PANEL
Anion gap: 3 — ABNORMAL LOW (ref 5–15)
BUN: 15 mg/dL (ref 8–23)
CO2: 29 mmol/L (ref 22–32)
Calcium: 8.6 mg/dL — ABNORMAL LOW (ref 8.9–10.3)
Chloride: 99 mmol/L (ref 98–111)
Creatinine, Ser: 0.58 mg/dL (ref 0.44–1.00)
GFR, Estimated: >60 mL/min (ref >60)
Glucose, Bld: 107 mg/dL — ABNORMAL HIGH (ref 70–99)
Potassium: 4.6 mmol/L (ref 3.5–5.1)
Sodium: 131 mmol/L — ABNORMAL LOW (ref 135–145)

## 2022-04-18 LAB — CBC
HCT: 31.3 % — ABNORMAL LOW (ref 36.0–46.0)
Hemoglobin: 10.6 g/dL — ABNORMAL LOW (ref 12.0–15.0)
MCH: 30.4 pg (ref 26.0–34.0)
MCHC: 33.9 g/dL (ref 30.0–36.0)
MCV: 89.7 fL (ref 80.0–100.0)
Platelets: 180 10*3/uL (ref 150–400)
RBC: 3.49 MIL/uL — ABNORMAL LOW (ref 3.87–5.11)
RDW: 14.8 % (ref 11.5–15.5)
WBC: 8.7 10*3/uL (ref 4.0–10.5)
nRBC: 0 % (ref 0.0–0.2)

## 2022-04-18 MED ORDER — ENOXAPARIN SODIUM 40 MG/0.4ML IJ SOSY
40.0000 mg | PREFILLED_SYRINGE | INTRAMUSCULAR | Status: DC
  Administered 2022-04-19 – 2022-04-20 (×2): 40 mg via SUBCUTANEOUS
  Filled 2022-04-18 (×2): qty 0.4

## 2022-04-18 NOTE — Progress Notes (Signed)
Physical Therapy Treatment Patient Details Name: Hailey Wong MRN: [id number] DOB: 04-16-1946 Today's Date: 04/18/2022   History of Present Illness Hailey Wong is a 76 y.o. female with medical history significant of hypertension, hyperlipidemia, smoking and osteopenia.  She was in the garage and tripped up a step and had a fall and hit her head on the step.  Her husband got her up and to the recliner.  The next day she went to [facility] because she could not get up due to R hip pain. She was diagnosed with Displaced Subcapital fracture right hip and sent into the emergency room where she was admitted. She underwent Right   hip hemiarthroplasty with Stryker Accolade prosthesis on 04/16/2022. She was made PWB to 25% in chart. Dr. Sabra Heck said to PT via instant message on 04/17/22 that she could be allowed to have more weight bearing if needed for function but try to minimize weight bearing as much as possible.    PT Comments    Patient received in recliner, ready to get up. Son and DIL in room on arrival and they would like for patient to go to rehab. She has steps inside home and steps to enter home. Patient also agreeable to rehab. She is making good progress toward goals and could potentially go home if continues to make good progress and is able to get up/down steps safely. WB status may make that difficult. She will continue to benefit from skilled PT to improve independence and safety.      Recommendations for follow up therapy are one component of a multi-disciplinary discharge planning process, led by the attending physician.  Recommendations may be updated based on patient status, additional functional criteria and insurance authorization.  Follow Up Recommendations  Skilled nursing-short term rehab (<3 hours/day) Can patient physically be transported by private vehicle: Yes   Assistance Recommended at Discharge Intermittent Supervision/Assistance  Patient can return home with the  following A little help with walking and/or transfers;Assistance with cooking/housework;Assist for transportation;A little help with bathing/dressing/bathroom;Help with stairs or ramp for entrance   Equipment Recommendations  Rolling walker (2 wheels);BSC/3in1    Recommendations for Other Services OT consult     Precautions / Restrictions Precautions Precautions: Posterior Hip Precaution Comments: verbally educated patient on precautions, no R hip flexion past 90, IR or adduction past neutral. Restrictions Weight Bearing Restrictions: Yes RLE Weight Bearing: Partial weight bearing RLE Partial Weight Bearing Percentage or Pounds: Chart order states PWB to 25%. Instant message instructions from Dr. Sabra Heck to PT on 04/17/22: she could be allowed to have more weight bearing if needed for function but try to minimize weight bearing as much as possible     Mobility  Bed Mobility               General bed mobility comments: bed mobility not observed by PT. Patient already up in chair with Nursing assistance. Patient sleeps in recliner at baseline due to difficulty breathing at night.    Transfers Overall transfer level: Needs assistance Equipment used: Rolling walker (2 wheels) Transfers: Sit to/from Stand Sit to Stand: Supervision           General transfer comment: Cues for hand placement during sit to stand transfer    Ambulation/Gait Ambulation/Gait assistance: Supervision, Min guard Gait Distance (Feet): 100 Feet Assistive device: Rolling walker (2 wheels) Gait Pattern/deviations: Step-to pattern, Decreased step length - left, Decreased dorsiflexion - right, Decreased weight shift to right, Knee flexed in stance - right  Gait velocity: very slow     General Gait Details: minimal weight bearing on R LE with pressure through ball of foot. Cued to attempt flat foot gait but patient was not comfortable doing so just yet. Patient required cuning for safe body/AD placement  and to maintain weight bearing restrictions.   Stairs             Wheelchair Mobility    Modified Rankin (Stroke Patients Only)       Balance Overall balance assessment: Needs assistance Sitting-balance support: Feet supported Sitting balance-Leahy Scale: Good     Standing balance support: Bilateral upper extremity supported, During functional activity, Reliant on assistive device for balance Standing balance-Leahy Scale: Fair Standing balance comment: Patient able to take hands of RW and manage underclothing and/or wash hands associated with toileting, but she was reliant on B UE support with RW for ambulation.                            Cognition Arousal/Alertness: Awake/alert Behavior During Therapy: WFL for tasks assessed/performed Overall Cognitive Status: Within Functional Limits for tasks assessed                                          Exercises      General Comments        Pertinent Vitals/Pain Pain Assessment Pain Assessment: No/denies pain    Home Living                          Prior Function            PT Goals (current goals can now be found in the care plan section) Acute Rehab PT Goals Patient Stated Goal: to get better, met son and DIL they would like for her to go to rehab PT Goal Formulation: With patient/family Time For Goal Achievement: 05/01/22 Potential to Achieve Goals: Good Progress towards PT goals: Progressing toward goals    Frequency    BID      PT Plan Discharge plan needs to be updated    Co-evaluation              AM-PAC PT "6 Clicks" Mobility   Outcome Measure  Help needed turning from your back to your side while in a flat bed without using bedrails?: A Little Help needed moving from lying on your back to sitting on the side of a flat bed without using bedrails?: A Little Help needed moving to and from a bed to a chair (including a wheelchair)?: A Little Help  needed standing up from a chair using your arms (e.g., wheelchair or bedside chair)?: A Little Help needed to walk in hospital room?: A Little Help needed climbing 3-5 steps with a railing? : A Lot 6 Click Score: 17    End of Session Equipment Utilized During Treatment: Gait belt Activity Tolerance: Patient tolerated treatment well Patient left: in chair;with call bell/phone within reach;with chair alarm set Nurse Communication: Mobility status PT Visit Diagnosis: Unsteadiness on feet (R26.81);History of falling (Z91.81);Difficulty in walking, not elsewhere classified (R26.2)     Time: 7416-3845 PT Time Calculation (min) (ACUTE ONLY): 29 min  Charges:  $Gait Training: 23-37 mins  Vernis Cabacungan, PT, GCS 04/18/22,12:11 PM

## 2022-04-18 NOTE — Progress Notes (Signed)
  Subjective:  Patient reports pain as moderate.  No other complaints.  Objective:   VITALS:   Vitals:   04/17/22 0759 04/17/22 2210 04/18/22 0102 04/18/22 0800  BP: 138/72 131/73 124/67 118/62  Pulse: 83  65 65  Resp: '17  18 17  '$ Temp: 98.5 F (36.9 C)  97.9 F (36.6 C) 98.2 F (36.8 C)  TempSrc: Oral     SpO2: 97%  100% 100%  Weight:      Height:        PHYSICAL EXAM:  Sensation intact distally Dorsiflexion/Plantar flexion intact Incision: no drainage No cellulitis present  LABS  Results for orders placed or performed during the hospital encounter of 04/15/22 (from the past 24 hour(s))  CBC     Status: Abnormal   Collection Time: 04/18/22  4:30 AM  Result Value Ref Range   WBC 8.7 4.0 - 10.5 K/uL   RBC 3.49 (L) 3.87 - 5.11 MIL/uL   Hemoglobin 10.6 (L) 12.0 - 15.0 g/dL   HCT 31.3 (L) 36.0 - 46.0 %   MCV 89.7 80.0 - 100.0 fL   MCH 30.4 26.0 - 34.0 pg   MCHC 33.9 30.0 - 36.0 g/dL   RDW 14.8 11.5 - 15.5 %   Platelets 180 150 - 400 K/uL   nRBC 0.0 0.0 - 0.2 %  Basic metabolic panel     Status: Abnormal   Collection Time: 04/18/22  4:30 AM  Result Value Ref Range   Sodium 131 (L) 135 - 145 mmol/L   Potassium 4.6 3.5 - 5.1 mmol/L   Chloride 99 98 - 111 mmol/L   CO2 29 22 - 32 mmol/L   Glucose, Bld 107 (H) 70 - 99 mg/dL   BUN 15 8 - 23 mg/dL   Creatinine, Ser 0.58 0.44 - 1.00 mg/dL   Calcium 8.6 (L) 8.9 - 10.3 mg/dL   GFR, Estimated >60 >60 mL/min   Anion gap 3 (L) 5 - 15    DG Pelvis Portable  Result Date: 04/16/2022 CLINICAL DATA:  Status post hip hemiarthroplasty EXAM: PORTABLE PELVIS 1-2 VIEWS COMPARISON:  04/15/2022 FINDINGS: Postsurgical changes of right hip arthroplasty. Normal alignment without evidence of loosening or fracture. Expected soft tissue changes. Degenerative changes of the lower lumbar spine and SI joints. Mild left hip osteoarthritis. IMPRESSION: Postsurgical changes of right hip arthroplasty. No evidence of immediate hardware  complication. Electronically Signed   By: Maurine Simmering M.D.   On: 04/16/2022 14:26    Assessment/Plan: 2 Days Post-Op   Principal Problem:   Closed right hip fracture (HCC) Active Problems:   Essential hypertension   Hyponatremia   Hyperlipidemia   Hypokalemia   Osteopenia   Hypothyroidism   Tobacco abuse   Sleep apnea   Nocturnal hypoxia   Up with therapy Discharge to SNF when medically stable, OK from ortho standpoint   Lovell Sheehan , MD 04/18/2022, 12:53 PM

## 2022-04-18 NOTE — Progress Notes (Signed)
Progress Note   Patient: Hailey Wong BMW:[id number] DOB: [birth year] DOA: 04/15/2022     3 DOS: the patient was seen and examined on 04/18/2022   Brief hospital course: 76 y.o. female with medical history significant of hypertension, hyperlipidemia, smoking and osteopenia.  She was in the garage yesterday evening and tripped up a step and had a fall and hit her head on the step.  Her husband got her up and mated to the recliner.  10 AM this morning she needed to be evaluated because she could not get up secondary to pain in her hip.  She went to [facility] and was diagnosed with a right hip fracture and sent into the emergency room for further evaluation.   12/23.  Patient to the operating room for repair of right hip fracture.  Will get overnight oximetry to see if she qualifies for nocturnal oxygen.  Recommend discontinue IV fluids after surgery. 12/24.  Patient qualifies for nocturnal oxygen with overnight oximetry results in chart.  Seen walking in hallway with physical therapist. 12/25.  Hemoglobin 10.6 and sodium 131.  Assessment and Plan: * Closed right hip fracture (HCC) Postoperative day 2 for right hemiarthroplasty.  Patient not in much pain.  Physical therapy recommending rehab  Hyponatremia Likely secondary to hydrochlorothiazide.  Discontinue hydrochlorothiazide.  Fluid restrict.  Sodium 131 today.  Hyperlipidemia Continue Crestor  Essential hypertension Continue lisinopril Discontinued hydrochlorothiazide  Hypokalemia Replaced  Osteopenia Resume Boniva as outpatient.  Nocturnal hypoxia Qualifies for nocturnal oxygen  Sleep apnea Patient does not wear a CPAP.  Qualifies for nocturnal oxygen with overnight oximetry.  Tobacco abuse Patient not interested in nicotine patch at this point.  Hypothyroidism Continue levothyroxine.  TSH normal range.        Subjective: Patient feeling okay.  She was sitting in the chair.  Patient leaning towards going to  rehab.  Physical Exam: Vitals:   04/17/22 0759 04/17/22 2210 04/18/22 0102 04/18/22 0800  BP: 138/72 131/73 124/67 118/62  Pulse: 83  65 65  Resp: '17  18 17  '$ Temp: 98.5 F (36.9 C)  97.9 F (36.6 C) 98.2 F (36.8 C)  TempSrc: Oral     SpO2: 97%  100% 100%  Weight:      Height:       Physical Exam HENT:     Head: Normocephalic.     Mouth/Throat:     Pharynx: No oropharyngeal exudate.  Eyes:     General: Lids are normal.     Conjunctiva/sclera: Conjunctivae normal.  Cardiovascular:     Rate and Rhythm: Normal rate and regular rhythm.     Heart sounds: Normal heart sounds, S1 normal and S2 normal.  Pulmonary:     Breath sounds: No decreased breath sounds, wheezing, rhonchi or rales.  Abdominal:     Palpations: Abdomen is soft.     Tenderness: There is no abdominal tenderness.  Musculoskeletal:     Right lower leg: No swelling.     Left lower leg: No swelling.  Skin:    General: Skin is warm.     Findings: No rash.     Comments: Bruising around left eye  Neurological:     Mental Status: She is alert and oriented to person, place, and time.     Comments: Able to wiggle toes bilaterally.     Data Reviewed: Sodium 131, creatinine 0.58, hemoglobin 10.6  Family Communication: Spoke with husband at the bedside  Disposition: Status is: Inpatient Remains inpatient appropriate because: Will  need rehab.  Postoperative day 2  Planned Discharge Destination: Rehab    Time spent: 29 minutes  Author: Loletha Grayer, MD 04/18/2022 3:16 PM  For on call review [email or web address].

## 2022-04-18 NOTE — Plan of Care (Signed)

## 2022-04-19 ENCOUNTER — Encounter: Payer: Self-pay | Admitting: Specialist

## 2022-04-19 DIAGNOSIS — E871 Hypo-osmolality and hyponatremia: Secondary | ICD-10-CM | POA: Diagnosis not present

## 2022-04-19 DIAGNOSIS — S72001S Fracture of unspecified part of neck of right femur, sequela: Secondary | ICD-10-CM | POA: Diagnosis not present

## 2022-04-19 DIAGNOSIS — D62 Acute posthemorrhagic anemia: Secondary | ICD-10-CM

## 2022-04-19 DIAGNOSIS — E785 Hyperlipidemia, unspecified: Secondary | ICD-10-CM | POA: Diagnosis not present

## 2022-04-19 DIAGNOSIS — I10 Essential (primary) hypertension: Secondary | ICD-10-CM | POA: Diagnosis not present

## 2022-04-19 LAB — CBC
HCT: 30.4 % — ABNORMAL LOW (ref 36.0–46.0)
Hemoglobin: 10.2 g/dL — ABNORMAL LOW (ref 12.0–15.0)
MCH: 29.7 pg (ref 26.0–34.0)
MCHC: 33.6 g/dL (ref 30.0–36.0)
MCV: 88.4 fL (ref 80.0–100.0)
Platelets: 213 10*3/uL (ref 150–400)
RBC: 3.44 MIL/uL — ABNORMAL LOW (ref 3.87–5.11)
RDW: 14.7 % (ref 11.5–15.5)
WBC: 9.2 10*3/uL (ref 4.0–10.5)
nRBC: 0 % (ref 0.0–0.2)

## 2022-04-19 LAB — BASIC METABOLIC PANEL
Anion gap: 7 (ref 5–15)
BUN: 14 mg/dL (ref 8–23)
CO2: 25 mmol/L (ref 22–32)
Calcium: 8.4 mg/dL — ABNORMAL LOW (ref 8.9–10.3)
Chloride: 99 mmol/L (ref 98–111)
Creatinine, Ser: 0.49 mg/dL (ref 0.44–1.00)
GFR, Estimated: >60 mL/min (ref >60)
Glucose, Bld: 123 mg/dL — ABNORMAL HIGH (ref 70–99)
Potassium: 4.5 mmol/L (ref 3.5–5.1)
Sodium: 131 mmol/L — ABNORMAL LOW (ref 135–145)

## 2022-04-19 MED ORDER — POLYETHYLENE GLYCOL 3350 17 G PO PACK
17.0000 g | PACK | Freq: Once | ORAL | Status: AC
  Administered 2022-04-19: 17 g via ORAL
  Filled 2022-04-19: qty 1

## 2022-04-19 MED ORDER — POLYETHYLENE GLYCOL 3350 17 G PO PACK
17.0000 g | PACK | Freq: Every day | ORAL | Status: DC
  Filled 2022-04-19: qty 1

## 2022-04-19 NOTE — Plan of Care (Signed)

## 2022-04-19 NOTE — Progress Notes (Signed)
Physical Therapy Treatment Patient Details Name: Hailey Wong MRN: [id number] DOB: 26-Aug-1945 Today's Date: 04/19/2022   History of Present Illness Hailey Wong is a 76 y.o. female with medical history significant of hypertension, hyperlipidemia, smoking and osteopenia.  She was in the garage and tripped up a step and had a fall and hit her head on the step.  Her husband got her up and to the recliner.  The next day she went to [facility] because she could not get up due to R hip pain. She was diagnosed with Displaced Subcapital fracture right hip and sent into the emergency room where she was admitted. She underwent Right   hip hemiarthroplasty with Stryker Accolade prosthesis on 04/16/2022. She was made PWB to 25% in chart. Dr. Sabra Heck said to PT via instant message on 04/17/22 that she could be allowed to have more weight bearing if needed for function but try to minimize weight bearing as much as possible.    PT Comments    Pt was sitting in recliner upon arrival. A and O x 4 and agreeable to session. She continues to progress with transfers and gait. Was able to demonstrate good adhering to proper wet bearing restrictions and was able to demonstrate correct performance of HEP. Will perform stair training next session and continue to progress pt towards maximal independence with all her ADLs.    Recommendations for follow up therapy are one component of a multi-disciplinary discharge planning process, led by the attending physician.  Recommendations may be updated based on patient status, additional functional criteria and insurance authorization.  Follow Up Recommendations  Skilled nursing-short term rehab (<3 hours/day)     Assistance Recommended at Discharge Intermittent Supervision/Assistance  Patient can return home with the following A little help with walking and/or transfers;Assistance with cooking/housework;Assist for transportation;A little help with bathing/dressing/bathroom;Help  with stairs or ramp for entrance   Equipment Recommendations  Rolling walker (2 wheels);BSC/3in1       Precautions / Restrictions Precautions Precautions: Posterior Hip Precaution Booklet Issued: No Restrictions Weight Bearing Restrictions: Yes RLE Weight Bearing: Partial weight bearing RLE Partial Weight Bearing Percentage or Pounds: Chart order states PWB to 25%. Instant message instructions from Dr. Sabra Heck to PT on 04/17/22: she could be allowed to have more weight bearing if needed for function but try to minimize weight bearing as much as possible     Mobility  Bed Mobility    General bed mobility comments: In recliner pre/post session    Transfers Overall transfer level: Needs assistance Equipment used: Rolling walker (2 wheels) Transfers: Sit to/from Stand Sit to Stand: Min guard     Ambulation/Gait Ambulation/Gait assistance: Supervision Gait Distance (Feet): 100 Feet Assistive device: Rolling walker (2 wheels) Gait Pattern/deviations: Step-to pattern, Decreased step length - left, Decreased dorsiflexion - right, Decreased weight shift to right, Knee flexed in stance - right Gait velocity: WNL     General Gait Details: Pt was able to ambulate ~100 ft without LOB. Does do well maintaining PWB   Stairs Stairs: Yes       General stair comments: Will perform stairs next session    Balance Overall balance assessment: Needs assistance Sitting-balance support: Feet supported Sitting balance-Leahy Scale: Good     Standing balance support: Bilateral upper extremity supported, During functional activity, Reliant on assistive device for balance Standing balance-Leahy Scale: Fair       Cognition Arousal/Alertness: Awake/alert Behavior During Therapy: WFL for tasks assessed/performed Overall Cognitive Status: Within Functional Limits for tasks  assessed      General Comments: Pt is A and O x 4               Pertinent Vitals/Pain Pain Assessment Pain  Assessment: 0-10 Pain Score: 0-No pain Pain Descriptors / Indicators: Discomfort Pain Intervention(s): Limited activity within patient's tolerance, Monitored during session, Premedicated before session, Repositioned     PT Goals (current goals can now be found in the care plan section) Acute Rehab PT Goals Patient Stated Goal: rehab then home Progress towards PT goals: Progressing toward goals    Frequency    BID      PT Plan Current plan remains appropriate       AM-PAC PT "6 Clicks" Mobility   Outcome Measure  Help needed turning from your back to your side while in a flat bed without using bedrails?: A Little Help needed moving from lying on your back to sitting on the side of a flat bed without using bedrails?: A Little Help needed moving to and from a bed to a chair (including a wheelchair)?: A Little Help needed standing up from a chair using your arms (e.g., wheelchair or bedside chair)?: A Little Help needed to walk in hospital room?: A Little Help needed climbing 3-5 steps with a railing? : A Lot 6 Click Score: 17    End of Session   Activity Tolerance: Patient tolerated treatment well Patient left: in chair;with call bell/phone within reach;with chair alarm set Nurse Communication: Mobility status PT Visit Diagnosis: Unsteadiness on feet (R26.81);History of falling (Z91.81);Difficulty in walking, not elsewhere classified (R26.2)     Time: 6384-5364 PT Time Calculation (min) (ACUTE ONLY): 26 min  Charges:  $Gait Training: 8-22 mins $Therapeutic Exercise: 8-22 mins                     Julaine Fusi PTA 04/19/22, 4:24 PM

## 2022-04-19 NOTE — Progress Notes (Signed)
Physical Therapy Treatment Patient Details Name: Hailey Wong MRN: [id number] DOB: Oct 09, 1945 Today's Date: 04/19/2022   History of Present Illness Hailey Wong is a 76 y.o. female with medical history significant of hypertension, hyperlipidemia, smoking and osteopenia.  She was in the garage and tripped up a step and had a fall and hit her head on the step.  Her husband got her up and to the recliner.  The next day she went to [facility] because she could not get up due to R hip pain. She was diagnosed with Displaced Subcapital fracture right hip and sent into the emergency room where she was admitted. She underwent Right   hip hemiarthroplasty with Stryker Accolade prosthesis on 04/16/2022. She was made PWB to 25% in chart. Dr. Sabra Heck said to PT via instant message on 04/17/22 that she could be allowed to have more weight bearing if needed for function but try to minimize weight bearing as much as possible.    PT Comments    Pt was with RN tech upon arriving. Chief Strategy Officer took over for [employer]. Pt was requesting to go to BR. She tolerated ambulation to/from BR but still requires Vcs for proper wt bearing restrictions and for improved technique for limiting wt off LE. Overall endorses feeling better today versus previous date. She will require STR/SNF at DC to maximize her independence while assisting pt to PLOF. Author will return for PM session with focus on proper wt bearing and advancing strength.    Recommendations for follow up therapy are one component of a multi-disciplinary discharge planning process, led by the attending physician.  Recommendations may be updated based on patient status, additional functional criteria and insurance authorization.  Follow Up Recommendations  Skilled nursing-short term rehab (<3 hours/day)     Assistance Recommended at Discharge Intermittent Supervision/Assistance  Patient can return home with the following A little help with walking and/or  transfers;Assistance with cooking/housework;Assist for transportation;A little help with bathing/dressing/bathroom;Help with stairs or ramp for entrance   Equipment Recommendations  Rolling walker (2 wheels);BSC/3in1       Precautions / Restrictions Precautions Precautions: Posterior Hip Precaution Booklet Issued: No Restrictions Weight Bearing Restrictions: Yes RLE Weight Bearing: Partial weight bearing RLE Partial Weight Bearing Percentage or Pounds: Chart order states PWB to 25%. Instant message instructions from Dr. Sabra Heck to PT on 04/17/22: she could be allowed to have more weight bearing if needed for function but try to minimize weight bearing as much as possible     Mobility  Bed Mobility  General bed mobility comments: in recliner pre/post session    Transfers Overall transfer level: Needs assistance Equipment used: Rolling walker (2 wheels) Transfers: Sit to/from Stand Sit to Stand: Min guard     Ambulation/Gait Ambulation/Gait assistance: Min guard, Supervision Gait Distance (Feet): 25 Feet Assistive device: Rolling walker (2 wheels) Gait Pattern/deviations: Step-to pattern, Decreased step length - left, Decreased dorsiflexion - right, Decreased weight shift to right, Knee flexed in stance - right Gait velocity: very slow     General Gait Details: requires Vcs for limiting wt. pt put forth good effort to restrict but still struggles to fully adhere. distance limited by author, not pt.    Balance Overall balance assessment: Needs assistance Sitting-balance support: Feet supported Sitting balance-Leahy Scale: Good     Standing balance support: Bilateral upper extremity supported, During functional activity, Reliant on assistive device for balance Standing balance-Leahy Scale: Fair       Cognition Arousal/Alertness: Awake/alert Behavior During Therapy: [facility]  for tasks assessed/performed Overall Cognitive Status: Within Functional Limits for tasks assessed                Pertinent Vitals/Pain Pain Assessment Pain Assessment: 0-10 Pain Score: 4  Pain Descriptors / Indicators: Discomfort Pain Intervention(s): Limited activity within patient's tolerance, Monitored during session, Premedicated before session, Repositioned     PT Goals (current goals can now be found in the care plan section) Acute Rehab PT Goals Patient Stated Goal: rehab then home Progress towards PT goals: Progressing toward goals    Frequency    BID      PT Plan Current plan remains appropriate       AM-PAC PT "6 Clicks" Mobility   Outcome Measure  Help needed turning from your back to your side while in a flat bed without using bedrails?: A Little Help needed moving from lying on your back to sitting on the side of a flat bed without using bedrails?: A Little Help needed moving to and from a bed to a chair (including a wheelchair)?: A Little Help needed standing up from a chair using your arms (e.g., wheelchair or bedside chair)?: A Little Help needed to walk in hospital room?: A Little Help needed climbing 3-5 steps with a railing? : A Lot 6 Click Score: 17    End of Session   Activity Tolerance: Patient tolerated treatment well;Other (comment) (limited by wt bearing restrictions) Patient left: in chair;with call bell/phone within reach;with chair alarm set Nurse Communication: Mobility status PT Visit Diagnosis: Unsteadiness on feet (R26.81);History of falling (Z91.81);Difficulty in walking, not elsewhere classified (R26.2)     Time: 6712-4580 PT Time Calculation (min) (ACUTE ONLY): 14 min  Charges:  $Gait Training: 8-22 mins                     Julaine Fusi PTA 04/19/22, 1:38 PM

## 2022-04-19 NOTE — Progress Notes (Signed)
Subjective: 3 Days Post-Op Procedure(s) (LRB): ARTHROPLASTY BIPOLAR HIP (HEMIARTHROPLASTY) (Right) Patient is out of bed in the chair.  Her husband is at the bedside.  She is doing very well with minimal pain.  Hemoglobin and sodium remained stable.  She is making progress with PT.  Plan is to go to rehab for short time.  Patient reports pain as mild.  Objective:   VITALS:   Vitals:   04/18/22 2303 04/19/22 0759  BP: (!) 146/69 127/72  Pulse: 83 71  Resp: 16 16  Temp: 98.3 F (36.8 C) 98.4 F (36.9 C)  SpO2: 98% 98%    Neurologically intact Incision: dressing C/D/I Hip is stable.  Ankle motion is good.  LABS Recent Labs    04/17/22 0433 04/18/22 0430 04/19/22 0335  HGB 11.3* 10.6* 10.2*  HCT 32.8* 31.3* 30.4*  WBC 8.3 8.7 9.2  PLT 192 180 213    Recent Labs    04/17/22 0433 04/18/22 0430 04/19/22 0335  NA 130* 131* 131*  K 4.2 4.6 4.5  BUN '9 15 14  '$ CREATININE 0.42* 0.58 0.49  GLUCOSE 122* 107* 123*    No results for input(s): "LABPT", "INR" in the last 72 hours.   Assessment/Plan: 3 Days Post-Op Procedure(s) (LRB): ARTHROPLASTY BIPOLAR HIP (HEMIARTHROPLASTY) (Right)   Up with therapy Discharge to SNF when bed available.  Partial weightbearing right lower extremity. 81 mg grams aspirin twice daily on discharge. Return to the office 10 to 14 days for x-ray and stitch removal.

## 2022-04-19 NOTE — Progress Notes (Signed)
Progress Note   Patient: Hailey Wong VZD:[id number] DOB: Dec 06, 1945 DOA: 04/15/2022     4 DOS: the patient was seen and examined on 04/19/2022   Brief hospital course: 76 y.o. female with medical history significant of hypertension, hyperlipidemia, smoking and osteopenia.  She was in the garage yesterday evening and tripped up a step and had a fall and hit her head on the step.  Her husband got her up and mated to the recliner.  10 AM this morning she needed to be evaluated because she could not get up secondary to pain in her hip.  She went to [facility] and was diagnosed with a right hip fracture and sent into the emergency room for further evaluation.   12/23.  Patient to the operating room for repair of right hip fracture.  Will get overnight oximetry to see if she qualifies for nocturnal oxygen.  Recommend discontinue IV fluids after surgery. 12/24.  Patient qualifies for nocturnal oxygen with overnight oximetry results in chart.  Seen walking in hallway with physical therapist. 12/25.  Hemoglobin 10.6 and sodium 131. 12/26.  Hemoglobin 10.2 and sodium 131.  Assessment and Plan: * Closed right hip fracture (HCC) Postoperative day 3 for right hemiarthroplasty.  Patient not in much pain.  Physical therapy recommending rehab.  Hyponatremia Likely secondary to hydrochlorothiazide.  Discontinue hydrochlorothiazide.  Fluid restrict.  Sodium 131 today.  Hyperlipidemia Continue Crestor  Essential hypertension Continue lisinopril Discontinued hydrochlorothiazide  Hypokalemia Replaced  Osteopenia Resume Boniva as outpatient.  Postoperative anemia due to acute blood loss Hemoglobin 12.7 on presentation and down to 10.2.  Patient was given IV fluids initially and also had an operation.  Continue to monitor hemoglobin.  Nocturnal hypoxia Qualifies for nocturnal oxygen  Sleep apnea Patient does not wear a CPAP.  Qualifies for nocturnal oxygen with overnight oximetry.  Tobacco  abuse Patient not interested in nicotine patch at this point.  Hypothyroidism Continue levothyroxine.  TSH normal range.        Subjective: Patient feels okay.  Finally had a bowel movement.  Not much pain in the hip.  Admitted after hip fracture.  Physical Exam: Vitals:   04/18/22 2108 04/18/22 2303 04/19/22 0759 04/19/22 1632  BP: (!) 145/72 (!) 146/69 127/72 133/82  Pulse: 74 83 71 67  Resp:  '16 16 16  '$ Temp:  98.3 F (36.8 C) 98.4 F (36.9 C) 98.7 F (37.1 C)  TempSrc:      SpO2: 99% 98% 98% 98%  Weight:      Height:       Physical Exam HENT:     Head: Normocephalic.     Mouth/Throat:     Pharynx: No oropharyngeal exudate.  Eyes:     General: Lids are normal.     Conjunctiva/sclera: Conjunctivae normal.  Cardiovascular:     Rate and Rhythm: Normal rate and regular rhythm.     Heart sounds: Normal heart sounds, S1 normal and S2 normal.  Pulmonary:     Breath sounds: No decreased breath sounds, wheezing, rhonchi or rales.  Abdominal:     Palpations: Abdomen is soft.     Tenderness: There is no abdominal tenderness.  Musculoskeletal:     Right lower leg: No swelling.     Left lower leg: No swelling.  Skin:    General: Skin is warm.     Findings: No rash.     Comments: Bruising around left eye  Neurological:     Mental Status: She is alert and oriented  to person, place, and time.     Comments: Able to wiggle toes bilaterally.     Data Reviewed: Sodium 131, hemoglobin 10.2  Family Communication: Spoke with husband yesterday  Disposition: Status is: Inpatient Remains inpatient appropriate because: Physical therapy recommending rehab.  Will need a place to go and also insurance authorization.  Planned Discharge Destination: Rehab    Time spent: 28 minutes  Author: Loletha Grayer, MD 04/19/2022 5:49 PM  For on call review [email or web address].

## 2022-04-19 NOTE — Plan of Care (Signed)
  Problem: Safety: Goal: Ability to remain free from injury will improve Outcome: Progressing   Problem: Skin Integrity: Goal: Risk for impaired skin integrity will decrease Outcome: Progressing   Problem: Activity: Goal: Ability to ambulate and perform ADLs will improve Outcome: Progressing

## 2022-04-19 NOTE — Assessment & Plan Note (Signed)
Hemoglobin 12.7 on presentation and down to 10.2.  Patient was given IV fluids initially and also had an operation.  Continue to monitor hemoglobin.

## 2022-04-20 DIAGNOSIS — I10 Essential (primary) hypertension: Secondary | ICD-10-CM

## 2022-04-20 DIAGNOSIS — E871 Hypo-osmolality and hyponatremia: Secondary | ICD-10-CM | POA: Diagnosis not present

## 2022-04-20 DIAGNOSIS — E039 Hypothyroidism, unspecified: Secondary | ICD-10-CM

## 2022-04-20 DIAGNOSIS — E785 Hyperlipidemia, unspecified: Secondary | ICD-10-CM

## 2022-04-20 DIAGNOSIS — D62 Acute posthemorrhagic anemia: Secondary | ICD-10-CM

## 2022-04-20 DIAGNOSIS — G4730 Sleep apnea, unspecified: Secondary | ICD-10-CM

## 2022-04-20 DIAGNOSIS — S72001A Fracture of unspecified part of neck of right femur, initial encounter for closed fracture: Secondary | ICD-10-CM | POA: Diagnosis not present

## 2022-04-20 DIAGNOSIS — G4734 Idiopathic sleep related nonobstructive alveolar hypoventilation: Secondary | ICD-10-CM

## 2022-04-20 LAB — SODIUM: Sodium: 133 mmol/L — ABNORMAL LOW (ref 135–145)

## 2022-04-20 LAB — HEMOGLOBIN: Hemoglobin: 10.4 g/dL — ABNORMAL LOW (ref 12.0–15.0)

## 2022-04-20 LAB — SURGICAL PATHOLOGY

## 2022-04-20 MED ORDER — FERROUS SULFATE 325 (65 FE) MG PO TABS: 325.0000 mg | ORAL_TABLET | Freq: Every day | ORAL | 3 refills | Status: DC

## 2022-04-20 MED ORDER — METHOCARBAMOL 500 MG PO TABS: 500.0000 mg | ORAL_TABLET | Freq: Four times a day (QID) | ORAL | 0 refills | Status: DC | PRN

## 2022-04-20 MED ORDER — ASPIRIN 81 MG PO TBEC: 81.0000 mg | DELAYED_RELEASE_TABLET | Freq: Two times a day (BID) | ORAL | 0 refills | Status: AC

## 2022-04-20 MED ORDER — HYDROCODONE-ACETAMINOPHEN 5-325 MG PO TABS: 1.0000 | ORAL_TABLET | ORAL | 0 refills | Status: DC | PRN

## 2022-04-20 MED ORDER — POLYETHYLENE GLYCOL 3350 17 G PO PACK: 17.0000 g | PACK | Freq: Every day | ORAL | 0 refills | Status: AC

## 2022-04-20 MED ORDER — LIDOCAINE HCL URETHRAL/MUCOSAL 2 % EX GEL: 1.0000 | Freq: Once | CUTANEOUS | Status: DC

## 2022-04-20 NOTE — Progress Notes (Signed)
Patient is not able to walk the distance required to go the bathroom, or he/she is unable to safely negotiate stairs required to access the bathroom.  A 3in1 BSC will alleviate this problem  

## 2022-04-20 NOTE — Progress Notes (Signed)
Hailey Wong to be D/C'd Home per MD order.  Discussed prescriptions and follow up appointments with the patient. Medication list explained in detail. Pt verbalized understanding. BSC and wheelchair delivered to room.  Skin clean, dry and intact without evidence of skin break down, no evidence of skin tears noted. IV catheter discontinued intact. Site without signs and symptoms of complications. Dressing and pressure applied. Pt denies pain at this time. No complaints noted.  An After Visit Summary was printed and given to the patient. Patient escorted via [place], and D/C home via private auto.  Rolley Sims

## 2022-04-20 NOTE — TOC Transition Note (Addendum)
Transition of Care [facility]) - CM/SW Discharge Note   Patient Details  Name: Hailey Wong MRN: [id number] Date of Birth: February 07, 1946  Transition of Care [facility] CM/SW Contact:  Quin Hoop, LCSW Phone Number: 04/20/2022, 2:48 PM   Clinical Narrative:   Patient discharge orders placed.  CSW ordered bedside commode to be delivered to pt room prior to discharge.  Pt to receive HH srvs from [place].  Tommi Rumps has been notified of discharge today.  Pt declined RW.  States she already has one.  Pt to receive ONO.  Report to be received by [employer] [facility].    Final next level of care: [place] Barriers to Discharge: No Barriers Identified   Patient Goals and CMS Choice      Discharge Placement                         Discharge Plan and Services Additional resources added to the After Visit Summary for                  DME Arranged: Bedside commode DME Agency: [facility] Date DME Agency Contacted: 04/20/22     HH Arranged: OT, PT [place] Agency: [place] Date [place]: 04/20/22   Representative spoke with at [place]: [place] Determinants of Health ([place]) Interventions SDOH Screenings   Food Insecurity: No Food Insecurity (04/16/2022)  Housing: Low Risk  (04/16/2022)  Transportation Needs: No Transportation Needs (04/16/2022)  Utilities: Not At Risk (04/16/2022)  Alcohol Screen: Low Risk  (01/18/2022)  Depression (PHQ2-9): Low Risk  (01/18/2022)  Tobacco Use: High Risk (04/19/2022)     Readmission Risk Interventions     No data to display

## 2022-04-20 NOTE — Discharge Summary (Addendum)
Physician Discharge Summary   Patient: Hailey Wong MRN: [id number] DOB: 09/26/1945  Admit date:     04/15/2022  Discharge date: 04/20/22  Discharge Physician: Lorella Nimrod   PCP: Lavera Guise, MD   Recommendations at discharge:  Please obtain CBC and BMP in 1 week Follow-up with primary care provider Follow-up with orthopedic surgery  Discharge Diagnoses: Principal Problem:   Closed right hip fracture [facility]) Active Problems:   Hyponatremia   Essential hypertension   Hyperlipidemia   Hypokalemia   Osteopenia   Hypothyroidism   Tobacco abuse   Sleep apnea   Nocturnal hypoxia   Postoperative anemia due to acute blood loss   Hospital Course: 76 y.o. female with medical history significant of hypertension, hyperlipidemia, smoking and osteopenia.  She was in the garage yesterday evening and tripped up a step and had a fall and hit her head on the step.  Her husband got her up and mated to the recliner.  10 AM this morning she needed to be evaluated because she could not get up secondary to pain in her hip.  She went to [facility] and was diagnosed with a right hip fracture and sent into the emergency room for further evaluation.   12/23.  Patient to the operating room for repair of right hip fracture.  Will get overnight oximetry to see if she qualifies for nocturnal oxygen.  Recommend discontinue IV fluids after surgery. 12/24.  Patient qualifies for nocturnal oxygen with overnight oximetry results in chart.  Seen walking in hallway with physical therapist. 12/25.  Hemoglobin 10.6 and sodium 131. 12/26.  Hemoglobin 10.2 and sodium 131. 12/27: Hemoglobin 10.4 and sodium of 133.  Although her physical therapist recommended rehab but patient would like to go home with home health services which were ordered.  She refused rehab.  She is being discharged home with home health services and rest of the care will be provided by family.  He is being given aspirin 81 mg twice daily for 2  weeks as advised by orthopedic surgery for DVT prophylaxis.  She is also being given pain medications and muscle relaxant to be used as needed.  She was started on iron supplement . HCTZ was discontinued due to hyponatremia.  Patient has also been given oxygen to use at night.  She will continue with rest of her home medications and follow-up with her providers for further recommendations.  Assessment and Plan: * Closed right hip fracture (HCC) Postoperative day 3 for right hemiarthroplasty.  Patient not in much pain.  Physical therapy recommending rehab.  Hyponatremia Likely secondary to hydrochlorothiazide.  Discontinue hydrochlorothiazide.  Fluid restrict.  Sodium 131 today.  Hyperlipidemia Continue Crestor  Essential hypertension Continue lisinopril Discontinued hydrochlorothiazide  Hypokalemia Replaced  Osteopenia Resume Boniva as outpatient.  Postoperative anemia due to acute blood loss Hemoglobin 12.7 on presentation and down to 10.2.  Patient was given IV fluids initially and also had an operation.  Continue to monitor hemoglobin.  Nocturnal hypoxia Qualifies for nocturnal oxygen  Sleep apnea Patient does not wear a CPAP.  Qualifies for nocturnal oxygen with overnight oximetry.  Tobacco abuse Patient not interested in nicotine patch at this point.  Hypothyroidism Continue levothyroxine.  TSH normal range.   Pain control - [employer] Controlled Substance Reporting System database was reviewed. and patient was instructed, not to drive, operate heavy machinery, perform activities at heights, swimming or participation in water activities or provide baby-sitting services while on Pain, Sleep and Anxiety Medications; until their  outpatient Physician has advised to do so again. Also recommended to not to take more than prescribed Pain, Sleep and Anxiety Medications.  Consultants: Orthopedic surgery Procedures performed: Hemiarthroplasty Disposition: Home  health Diet recommendation:  Discharge Diet Orders (From admission, onward)     Start     Ordered   04/20/22 0000  Diet - low sodium heart healthy        04/20/22 1254           Cardiac diet DISCHARGE MEDICATION: Allergies as of 04/20/2022   No Known Allergies      Medication List     STOP taking these medications    hydrochlorothiazide 12.5 MG tablet Commonly known as: HYDRODIURIL       TAKE these medications    aspirin EC 81 MG tablet Commonly known as: Aspirin 81 Take 1 tablet (81 mg total) by mouth in the morning and at bedtime for 14 days. What changed:  medication strength when to take this   calcium carbonate 600 MG Tabs tablet Commonly known as: OS-CAL Take 600 mg by mouth 2 (two) times daily with a meal.   D3 ADULT PO Take 1,000 Units by mouth daily.   ferrous sulfate 325 (65 FE) MG tablet Take 1 tablet (325 mg total) by mouth daily with breakfast. Start taking on: April 21, 2022   fluticasone 50 MCG/ACT nasal spray Commonly known as: FLONASE Place 1 spray into both nostrils daily as needed for allergies or rhinitis.   HYDROcodone-acetaminophen 5-325 MG tablet Commonly known as: NORCO/VICODIN Take 1-2 tablets by mouth every 4 (four) hours as needed for moderate pain (pain score 4-6).   ibandronate 150 MG tablet Commonly known as: Boniva Take in the morning with a full glass of water, on an empty stomach, and do not take anything else by mouth or lie down for the next 30 min.   levothyroxine 137 MCG tablet Commonly known as: SYNTHROID Take 137 mcg by mouth daily before breakfast.   lisinopril 10 MG tablet Commonly known as: ZESTRIL TAKE 1 TABLET BY MOUTH TWICE DAILY   methocarbamol 500 MG tablet Commonly known as: ROBAXIN Take 1 tablet (500 mg total) by mouth every 6 (six) hours as needed for muscle spasms.   polyethylene glycol 17 g packet Commonly known as: MIRALAX / GLYCOLAX Take 17 g by mouth daily. Start taking on: April 21, 2022   rosuvastatin 5 MG tablet Commonly known as: CRESTOR TAKE 1 TABLET BY MOUTH ONCE EVERY EVENING   Sennosides-Docusate Sodium 8.6-50 MG Caps Take by mouth.               Durable Medical Equipment  (From admission, onward)           Start     Ordered   04/17/22 1109  For home use only DME oxygen  Once       Question Answer Comment  Length of Need Lifetime   Mode or (Route) Nasal cannula   Liters per Minute 2   Frequency Only at night (stationary unit needed)   Oxygen conserving device Yes   Oxygen delivery system Gas      04/17/22 1109              Discharge Care Instructions  (From admission, onward)           Start     Ordered   04/20/22 0000  Leave dressing on - Keep it clean, dry, and intact until clinic visit  04/20/22 1254            Follow-up Information     Earnestine Leys, MD Follow up.   Specialty: Orthopedic Surgery Why: Please make appointment for the patient prior to her discharge  , For wound re-check Contact information: [place] [address] [phone]         Lavera Guise, MD. Schedule an appointment as soon as possible for a visit in 1 week(s).   Specialties: Internal Medicine, Cardiology Contact information: [place] [address] [phone]                Discharge Exam: Danley Danker Weights   04/15/22 1411 04/16/22 0146  Weight: 68 kg 69 kg   General.  Pleasant elderly lady, in no acute distress.  Bruising around left eye. Pulmonary.  Lungs clear bilaterally, normal respiratory effort. CV.  Regular rate and rhythm, no JVD, rub or murmur. Abdomen.  Soft, nontender, nondistended, BS positive. CNS.  Alert and oriented .  No focal neurologic deficit. Extremities.  No edema, no cyanosis, pulses intact and symmetrical. Psychiatry.  Judgment and insight appears normal.   Condition at discharge: stable  The results of significant diagnostics from this  hospitalization (including imaging, microbiology, ancillary and laboratory) are listed below for reference.   Imaging Studies: DG Pelvis Portable  Result Date: 04/16/2022 CLINICAL DATA:  Status post hip hemiarthroplasty EXAM: PORTABLE PELVIS 1-2 VIEWS COMPARISON:  04/15/2022 FINDINGS: Postsurgical changes of right hip arthroplasty. Normal alignment without evidence of loosening or fracture. Expected soft tissue changes. Degenerative changes of the lower lumbar spine and SI joints. Mild left hip osteoarthritis. IMPRESSION: Postsurgical changes of right hip arthroplasty. No evidence of immediate hardware complication. Electronically Signed   By: Maurine Simmering M.D.   On: 04/16/2022 14:26   CT Head Wo Contrast  Result Date: 04/15/2022 CLINICAL DATA:  Trauma, fall EXAM: CT HEAD WITHOUT CONTRAST TECHNIQUE: Contiguous axial images were obtained from the base of the skull through the vertex without intravenous contrast. RADIATION DOSE REDUCTION: This exam was performed according to the departmental dose-optimization program which includes automated exposure control, adjustment of the mA and/or kV according to patient size and/or use of iterative reconstruction technique. COMPARISON:  None Available. FINDINGS: Brain: No acute intracranial findings are seen. There are no signs of bleeding within the cranium. Cortical sulci are prominent. Ventricles are not dilated. Vascular: Scattered arterial calcifications are seen. Skull: No fracture is seen in calvarium. Sinuses/Orbits: There are no air-fluid levels. Other: There is subcutaneous contusion/hematoma in the left periorbital region. IMPRESSION: No acute intracranial findings are seen in noncontrast CT brain. Atrophy. Electronically Signed   By: Elmer Picker M.D.   On: 04/15/2022 16:31   CT Cervical Spine Wo Contrast  Result Date: 04/15/2022 CLINICAL DATA:  Trauma, fall EXAM: CT CERVICAL SPINE WITHOUT CONTRAST TECHNIQUE: Multidetector CT imaging of the  cervical spine was performed without intravenous contrast. Multiplanar CT image reconstructions were also generated. RADIATION DOSE REDUCTION: This exam was performed according to the departmental dose-optimization program which includes automated exposure control, adjustment of the mA and/or kV according to patient size and/or use of iterative reconstruction technique. COMPARISON:  None Available. FINDINGS: Alignment: There is minimal anterolisthesis at C3-C4 and C5-C6 levels, possibly suggesting remote ligament injury and facet degeneration. Skull base and vertebrae: No recent fracture is seen. Degenerative changes are noted, more so at C6-C7 level. Soft tissues and spinal canal: There is no central spinal stenosis Disc levels: There is encroachment of  neural foramina from C3 to C7 levels. Upper chest: Unremarkable. Other: None. IMPRESSION: No recent fracture is seen in cervical spine. Cervical spondylosis with encroachment of neural foramina at multiple levels. Electronically Signed   By: Elmer Picker M.D.   On: 04/15/2022 16:27   CT Maxillofacial WO CM  Result Date: 04/15/2022 CLINICAL DATA:  Trauma EXAM: CT MAXILLOFACIAL WITHOUT CONTRAST TECHNIQUE: Multidetector CT imaging of the maxillofacial structures was performed. Multiplanar CT image reconstructions were also generated. RADIATION DOSE REDUCTION: This exam was performed according to the departmental dose-optimization program which includes automated exposure control, adjustment of the mA and/or kV according to patient size and/or use of iterative reconstruction technique. COMPARISON:  None Available. FINDINGS: Osseous: No recent fracture is seen. There is cortical thickening in the wall of left maxillary sinus. Findings may be residual from previous injury or suggest Paget's disease. Orbits: Optic globes are symmetrical. Retrobulbar soft tissues are unremarkable. There is no evidence of blowout fracture. Sinuses: There are no air-fluid levels.  There is mild mucosal thickening in ethmoid and maxillary sinuses. There is minimal mucosal thickening in sphenoid sinus. There is mild deviation of nasal septum to the right. Soft tissues: There is subcutaneous contusion/hematoma in the left periorbital region. Limited intracranial: Unremarkable. IMPRESSION: No recent fracture is seen in facial bones. No focal abnormalities are seen in the orbits. There are no air-fluid levels in paranasal sinuses. Chronic sinusitis. Electronically Signed   By: Elmer Picker M.D.   On: 04/15/2022 16:23   DG Hip Unilat W or Wo Pelvis 2-3 Views Right  Result Date: 04/15/2022 CLINICAL DATA:  Fall. EXAM: DG HIP (WITH OR WITHOUT PELVIS) 2-3V RIGHT COMPARISON:  None Available. FINDINGS: Severely displaced proximal right femoral neck fracture is noted. IMPRESSION: Severely displaced proximal right femoral neck fracture. Electronically Signed   By: Marijo Conception M.D.   On: 04/15/2022 15:10   DG Chest 2 View  Result Date: 04/15/2022 CLINICAL DATA:  Fall. EXAM: CHEST - 2 VIEW COMPARISON:  November 26, 2019. FINDINGS: Stable cardiomediastinal silhouette. Both lungs are clear. The visualized skeletal structures are unremarkable. IMPRESSION: No active cardiopulmonary disease. Electronically Signed   By: Marijo Conception M.D.   On: 04/15/2022 15:09   MM 3D SCREEN BREAST BILATERAL  Result Date: 03/23/2022 CLINICAL DATA:  Screening. EXAM: DIGITAL SCREENING BILATERAL MAMMOGRAM WITH TOMOSYNTHESIS AND CAD TECHNIQUE: Bilateral screening digital craniocaudal and mediolateral oblique mammograms were obtained. Bilateral screening digital breast tomosynthesis was performed. The images were evaluated with computer-aided detection. COMPARISON:  Previous exam(s). ACR Breast Density Category c: The breast tissue is heterogeneously dense, which may obscure small masses. FINDINGS: There are no findings suspicious for malignancy. IMPRESSION: No mammographic evidence of malignancy. A result  letter of this screening mammogram will be mailed directly to the patient. RECOMMENDATION: Screening mammogram in one year. (Code:SM-B-01Y) BI-RADS CATEGORY  1: Negative. Electronically Signed   By: Fidela Salisbury M.D.   On: 03/23/2022 12:49    Microbiology: Results for orders placed or performed during the hospital encounter of 04/15/22  Surgical pcr screen     Status: Abnormal   Collection Time: 04/16/22  8:24 AM   Specimen: Nasal Mucosa; Nasal Swab  Result Value Ref Range Status   MRSA, PCR NEGATIVE NEGATIVE Final   Staphylococcus aureus POSITIVE (A) NEGATIVE Final    Comment: (NOTE) The Xpert SA Assay (FDA approved for NASAL specimens in patients 50 years of age and older), is one component of a comprehensive surveillance program. It is not intended to  diagnose infection nor to guide or monitor treatment. Performed at [facility], [address] [place], [place] [address]     Labs: CBC: Recent Labs  Lab 04/15/22 1530 04/16/22 0526 04/17/22 0433 04/18/22 0430 04/19/22 0335 04/20/22 0319  WBC 12.4* 11.6* 8.3 8.7 9.2  --   NEUTROABS 10.1*  --   --   --   --   --   HGB 13.8 12.7 11.3* 10.6* 10.2* 10.4*  HCT 39.9 37.0 32.8* 31.3* 30.4*  --   MCV 87.5 87.5 88.9 89.7 88.4  --   PLT 273 244 192 180 213  --    Basic Metabolic Panel: Recent Labs  Lab 04/15/22 1737 04/16/22 0526 04/17/22 0433 04/18/22 0430 04/19/22 0335 04/20/22 0319  NA 128* 129* 130* 131* 131* 133*  K 3.3* 4.2 4.2 4.6 4.5  --   CL 94* 98 98 99 99  --   CO2 '25 25 23 29 25  '$ --   GLUCOSE 191* 140* 122* 107* 123*  --   BUN '14 13 9 15 14  '$ --   CREATININE 0.63 0.53 0.42* 0.58 0.49  --   CALCIUM 8.2* 8.0* 8.2* 8.6* 8.4*  --    Liver Function Tests: No results for input(s): "AST", "ALT", "ALKPHOS", "BILITOT", "PROT", "ALBUMIN" in the last 168 hours. CBG: Recent Labs  Lab 04/16/22 0819  GLUCAP 134*    Discharge time spent: greater than 30 minutes.  This record has been created  using Systems analyst. Errors have been sought and corrected,but may not always be located. Such creation errors do not reflect on the standard of care.   Signed: Lorella Nimrod, MD [facility] 04/20/2022

## 2022-04-20 NOTE — Care Management Important Message (Signed)
Important Message  Patient Details  Name: Hailey Wong MRN: [id number] Date of Birth: May 29, 1945   Medicare Important Message Given:  Yes     Juliann Pulse A Alverna Fawley 04/20/2022, 1:32 PM

## 2022-04-20 NOTE — Progress Notes (Signed)
CSW spoke with pt about bed offers.  Pt not sure if she wants either of the beds. CSW reached out to Tammy @ [facility] to ask her to take a look at patient who is in pending status.  Lvm for Tammy to return the call.  CSW asked pt for a decision by 2pm today.  She is medically ready.

## 2022-04-20 NOTE — Progress Notes (Signed)
Physical Therapy Treatment Patient Details Name: Hailey Wong MRN: [id number] DOB: 25-Jun-1945 Today's Date: 04/20/2022   History of Present Illness Hailey Wong is a 76 y.o. female with medical history significant of hypertension, hyperlipidemia, smoking and osteopenia.  She was in the garage and tripped up a step and had a fall and hit her head on the step.  Her husband got her up and to the recliner.  The next day she went to [facility] because she could not get up due to R hip pain. She was diagnosed with Displaced [employer] fracture right hip and sent into the emergency room where she was admitted. She underwent Right   hip hemiarthroplasty with [employer] Accolade prosthesis on 04/16/2022. She was made PWB to 25% in chart. Dr. Sabra Heck said to PT via instant message on 04/17/22 that she could be allowed to have more weight bearing if needed for function but try to minimize weight bearing as much as possible.    PT Comments    Pt was sitting in recliner upon arriving. Agreeable to session and continues to be motivated and cooperative. Pt was easily and safely able to stand, ambulate, and perform stairs while maintaining proper wt bearing restrictions. Discussed DC home versus rehab. Currently pt still wanting DC to SNF but will discuss with her spouse. She is cleared from an acute PT standpoint to DC home with HHPT + 24/7 assist from spouse.    Recommendations for follow up therapy are one component of a multi-disciplinary discharge planning process, led by the attending physician.  Recommendations may be updated based on patient status, additional functional criteria and insurance authorization.  Follow Up Recommendations  Follow physician's recommendations for discharge plan and follow up therapies (pt is progressing quickly. Would be able to return home with 24/7 assist from spouse if cleared.)     Assistance Recommended at Discharge Intermittent Supervision/Assistance  Patient can return  home with the following A little help with walking and/or transfers;Assistance with cooking/housework;Assist for transportation;A little help with bathing/dressing/bathroom;Help with stairs or ramp for entrance   Equipment Recommendations  Rolling walker (2 wheels);BSC/3in1       Precautions / Restrictions Precautions Precautions: Posterior Hip Precaution Booklet Issued: No Restrictions Weight Bearing Restrictions: Yes RLE Weight Bearing: Partial weight bearing RLE Partial Weight Bearing Percentage or Pounds: Chart order states PWB to 25%. Instant message instructions from Dr. Sabra Heck to PT on 04/17/22: she could be allowed to have more weight bearing if needed for function but try to minimize weight bearing as much as possible     Mobility  Bed Mobility  General bed mobility comments: in recliner pre/post session    Transfers Overall transfer level: Needs assistance Equipment used: Rolling walker (2 wheels) Transfers: Sit to/from Stand Sit to Stand: Supervision     Ambulation/Gait Ambulation/Gait assistance: Supervision Gait Distance (Feet): 100 Feet Assistive device: Rolling walker (2 wheels) Gait Pattern/deviations: Step-to pattern, Decreased step length - left, Decreased dorsiflexion - right, Decreased weight shift to right, Knee flexed in stance - right Gait velocity: WNL     General Gait Details: pt demonstrated safe ability to ambulate 100 ft while maintaining proper wt bearing   Stairs Stairs: Yes Stairs assistance: Min guard Stair Management: No rails, Backwards, With walker Number of Stairs: 4 General stair comments: Pt was able to safely ascend/descend 4 stair without LOB or safety concern. Demonstrated to pt's spouse correct performance to simulate home environment   Balance Overall balance assessment: Needs assistance Sitting-balance support: Feet supported  Sitting balance-Leahy Scale: Good     Standing balance support: Bilateral upper extremity  supported, During functional activity, Reliant on assistive device for balance Standing balance-Leahy Scale: Good Standing balance comment: good balance with use of RW during dynamic task      Cognition Arousal/Alertness: Awake/alert Behavior During Therapy: WFL for tasks assessed/performed Overall Cognitive Status: Within Functional Limits for tasks assessed    General Comments: Pt is A and O x 4               Pertinent Vitals/Pain Pain Assessment Pain Assessment: 0-10 Pain Score: 3  Pain Location: LE pain Pain Descriptors / Indicators: Discomfort Pain Intervention(s): Limited activity within patient's tolerance, Monitored during session, Premedicated before session, Repositioned     PT Goals (current goals can now be found in the care plan section) Acute Rehab PT Goals Patient Stated Goal: rehab then home Progress towards PT goals: Progressing toward goals    Frequency    BID      PT Plan Current plan remains appropriate       AM-PAC PT "6 Clicks" Mobility   Outcome Measure  Help needed turning from your back to your side while in a flat bed without using bedrails?: A Little Help needed moving from lying on your back to sitting on the side of a flat bed without using bedrails?: A Little Help needed moving to and from a bed to a chair (including a wheelchair)?: A Little Help needed standing up from a chair using your arms (e.g., wheelchair or bedside chair)?: A Little Help needed to walk in hospital room?: A Little Help needed climbing 3-5 steps with a railing? : A Little 6 Click Score: 18    End of Session   Activity Tolerance: Patient tolerated treatment well Patient left: in chair;with call bell/phone within reach;with chair alarm set Nurse Communication: Mobility status PT Visit Diagnosis: Unsteadiness on feet (R26.81);History of falling (Z91.81);Difficulty in walking, not elsewhere classified (R26.2)     Time: 3846-6599 PT Time Calculation (min)  (ACUTE ONLY): 41 min  Charges:  $Gait Training: 23-37 mins $Therapeutic Activity: 8-22 mins                     Julaine Fusi PTA 04/20/22, 1:06 PM

## 2022-04-28 ENCOUNTER — Encounter: Payer: Self-pay | Admitting: Nurse Practitioner

## 2022-04-28 ENCOUNTER — Ambulatory Visit (INDEPENDENT_AMBULATORY_CARE_PROVIDER_SITE_OTHER): Payer: Medicare PPO | Admitting: Nurse Practitioner

## 2022-04-28 VITALS — BP 142/72 | HR 75 | Temp 97.7°F | Resp 16 | Ht 61.0 in | Wt 152.0 lb

## 2022-04-28 DIAGNOSIS — Z760 Encounter for issue of repeat prescription: Secondary | ICD-10-CM | POA: Diagnosis not present

## 2022-04-28 DIAGNOSIS — E871 Hypo-osmolality and hyponatremia: Secondary | ICD-10-CM | POA: Diagnosis not present

## 2022-04-28 DIAGNOSIS — S72001A Fracture of unspecified part of neck of right femur, initial encounter for closed fracture: Secondary | ICD-10-CM | POA: Diagnosis not present

## 2022-04-28 DIAGNOSIS — D509 Iron deficiency anemia, unspecified: Secondary | ICD-10-CM

## 2022-04-28 DIAGNOSIS — Z09 Encounter for follow-up examination after completed treatment for conditions other than malignant neoplasm: Secondary | ICD-10-CM | POA: Diagnosis not present

## 2022-04-28 DIAGNOSIS — S72001D Fracture of unspecified part of neck of right femur, subsequent encounter for closed fracture with routine healing: Secondary | ICD-10-CM

## 2022-04-28 MED ORDER — LISINOPRIL 10 MG PO TABS: 10.0000 mg | ORAL_TABLET | Freq: Two times a day (BID) | ORAL | 1 refills | Status: DC

## 2022-04-28 NOTE — Progress Notes (Signed)
[facility] [address] [place] [id number] [facility] Discharge Acute Issues Care Follow Up                                                                        Patient Demographics  Hailey Wong, is a 77 y.o. female  DOB 01-17-1946  MRN [id number].  Primary MD  Lavera Guise, MD   Reason for TCC follow Up - Closed right hip fracture    Past Medical History:  Diagnosis Date   Chronic cough    History of COVID-19    04/2021   Hypertension    Hypothyroidism    Mild cardiomegaly    Sinus trouble    Sleep apnea    no CPAP    Past Surgical History:  Procedure Laterality Date   BROW LIFT Bilateral 12/13/2019   Procedure: BLEPHAROPLASTY UPPER EYELID; W/EXCESS SKIN BLEPHAROPTOSIS REPAIR; RESECT EX;  Surgeon: Hailey Starch, MD;  Location: [facility];  Service: Ophthalmology;  Laterality: Bilateral;   CATARACT EXTRACTION W/PHACO Right 07/21/2021   Procedure: CATARACT EXTRACTION PHACO AND INTRAOCULAR LENS PLACEMENT ([place]) RIGHT 12.60 01:30.4;  Surgeon: Hailey Koyanagi, MD;  Location: [facility];  Service: Ophthalmology;  Laterality: Right;   CATARACT EXTRACTION W/PHACO Left 08/04/2021   Procedure: CATARACT EXTRACTION PHACO AND INTRAOCULAR LENS PLACEMENT ([facility]) LEFT 6.26 01:02.6;  Surgeon: Hailey Koyanagi, MD;  Location: [facility];  Service: Ophthalmology;  Laterality: Left;   COLONOSCOPY WITH PROPOFOL N/A 03/02/2021   Procedure: COLONOSCOPY WITH PROPOFOL;  Surgeon: Hailey Bellows, MD;  Location: [facility] ENDOSCOPY;  Service: Gastroenterology;  Laterality: N/A;   HIP ARTHROPLASTY Right 04/16/2022   Procedure: ARTHROPLASTY BIPOLAR HIP (HEMIARTHROPLASTY);  Surgeon: Hailey Leys, MD;  Location: [facility];  Service: Orthopedics;  Laterality: Right;   right ankle surgery  1997   THYROIDECTOMY Bilateral 10/20/2020   Procedure: THYROIDECTOMY;   Surgeon: Hailey Gust, MD;  Location: [facility];  Service: ENT;  Laterality: Bilateral;   TUBAL LIGATION  1977       Recent HPI and Hospital Course  Hospital Course: 77 y.o. female with medical history significant of hypertension, hyperlipidemia, smoking and osteopenia.  She was in the garage yesterday evening and tripped up a step and had a fall and hit her head on the step.  Her husband got her up and mated to the recliner.  10 AM this morning she needed to be evaluated because she could not get up secondary to pain in her hip.  She went to [facility] and was diagnosed with a right hip fracture and sent into the emergency room for further evaluation.    12/23.  Patient to the operating room for repair of right hip fracture.  Will get overnight oximetry to see if she qualifies for nocturnal oxygen.  Recommend discontinue IV fluids after surgery. 12/24.  Patient qualifies for nocturnal oxygen with overnight oximetry results in chart.  Seen walking in hallway with physical therapist. 12/25.  Hemoglobin 10.6 and sodium  131. 12/26.  Hemoglobin 10.2 and sodium 131. 12/27: Hemoglobin 10.4 and sodium of 133.  Although her physical therapist recommended rehab but patient would like to go home with home health services which were ordered.  She refused rehab.  She is being discharged home with home health services and rest of the care will be provided by family.  He is being given aspirin 81 mg twice daily for 2 weeks as advised by orthopedic surgery for DVT prophylaxis.  She is also being given pain medications and muscle relaxant to be used as needed.  She was started on iron supplement . HCTZ was discontinued due to hyponatremia.   Patient has also been given oxygen to use at night.   She will continue with rest of her home medications and follow-up with her providers for further recommendations.   Assessment and Plan: * Closed right hip fracture (HCC) Postoperative day 3 for right  hemiarthroplasty.  Patient not in much pain.  Physical therapy recommending rehab.   Hyponatremia Likely secondary to hydrochlorothiazide.  Discontinue hydrochlorothiazide.  Fluid restrict.  Sodium 131 today.   Hyperlipidemia Continue Crestor   Essential hypertension Continue lisinopril Discontinued hydrochlorothiazide   Hypokalemia Replaced   Osteopenia Resume Boniva as outpatient.   Postoperative anemia due to acute blood loss Hemoglobin 12.7 on presentation and down to 10.2.  Patient was given IV fluids initially and also had an operation.  Continue to monitor hemoglobin.   Nocturnal hypoxia Qualifies for nocturnal oxygen   Sleep apnea Patient does not wear a CPAP.  Qualifies for nocturnal oxygen with overnight oximetry.   Tobacco abuse Patient not interested in nicotine patch at this point.   Hypothyroidism Continue levothyroxine.  TSH normal range.  [facility] Acute Care Issue to be followed in the Clinic   Principal Problem:   Closed right hip fracture ([place]) Active Problems:   Hyponatremia   Essential hypertension   Hyperlipidemia   Hypokalemia   Osteopenia   Hypothyroidism   Tobacco abuse   Sleep apnea   Nocturnal hypoxia   Postoperative anemia due to acute blood loss   Subjective:   Hailey Wong today has, No headache, No chest pain, No abdominal pain - No Nausea, No new weakness tingling or numbness, No Cough - SOB. Denies pain  Assessment & Plan    1. Hospital discharge follow-up Routine follow up, some labs recommended to repeat to assess for resolution of abnormals. Has outpatient physical therapy   2. Closed fracture of right hip with routine healing, subsequent encounter Corrected with surgical intervention, has outpatient physical therapy and follow up with her surgeon  3. Hyponatremia Repeat lab ordered - CMP14+EGFR  4. Iron deficiency anemia, unspecified iron deficiency anemia type Repeat lab ordered - CBC with  Differential/Platelet  5. Medication refill - lisinopril (ZESTRIL) 10 MG tablet; Take 1 tablet (10 mg total) by mouth 2 (two) times daily.  Dispense: 180 tablet; Refill: 1   Reason for frequent admissions/ER visits   Hypertension Hyponatremia Hypocalcemia anemia      Objective:   Vitals:   04/28/22 1144  BP: (!) 142/72  Pulse: 75  Resp: 16  Temp: 97.7 F (36.5 C)  SpO2: 99%  Weight: 152 lb (68.9 kg)  Height: _0  (1.549 m)    Wt Readings from Last 3 Encounters:  04/28/22 152 lb (68.9 kg)  04/16/22 152 lb 1.9 oz (69 kg)  02/10/22 152 lb (68.9 kg)    Allergies as of 04/28/2022   No Known Allergies  Medication List        Accurate as of April 28, 2022 12:43 PM. If you have any questions, ask your nurse or doctor.          aspirin EC 81 MG tablet Commonly known as: Aspirin 81 Take 1 tablet (81 mg total) by mouth in the morning and at bedtime for 14 days.   calcium carbonate 600 MG Tabs tablet Commonly known as: OS-CAL Take 600 mg by mouth 2 (two) times daily with a meal.   D3 ADULT PO Take 1,000 Units by mouth daily.   ferrous sulfate 325 (65 FE) MG tablet Take 1 tablet (325 mg total) by mouth daily with breakfast.   fluticasone 50 MCG/ACT nasal spray Commonly known as: FLONASE Place 1 spray into both nostrils daily as needed for allergies or rhinitis.   HYDROcodone-acetaminophen 5-325 MG tablet Commonly known as: NORCO/VICODIN Take 1-2 tablets by mouth every 4 (four) hours as needed for moderate pain (pain score 4-6).   ibandronate 150 MG tablet Commonly known as: Boniva Take in the morning with a full glass of water, on an empty stomach, and do not take anything else by mouth or lie down for the next 30 min.   levothyroxine 137 MCG tablet Commonly known as: SYNTHROID Take 137 mcg by mouth daily before breakfast.   lisinopril 10 MG tablet Commonly known as: ZESTRIL Take 1 tablet (10 mg total) by mouth 2 (two) times daily.    methocarbamol 500 MG tablet Commonly known as: ROBAXIN Take 1 tablet (500 mg total) by mouth every 6 (six) hours as needed for muscle spasms.   polyethylene glycol 17 g packet Commonly known as: MIRALAX / GLYCOLAX Take 17 g by mouth daily.   rosuvastatin 5 MG tablet Commonly known as: CRESTOR TAKE 1 TABLET BY MOUTH ONCE EVERY EVENING   Sennosides-Docusate Sodium 8.6-50 MG Caps Take by mouth.         Physical Exam: Constitutional: Patient appears well-developed and well-nourished. Not in obvious distress. HENT: Normocephalic, atraumatic, External right and left ear normal. Oropharynx is clear and moist.  Eyes: Conjunctivae and EOM are normal. PERRLA, no scleral icterus. Neck: Normal ROM. Neck supple. No JVD. No tracheal deviation. No thyromegaly. CVS: RRR, S1/S2 +, no murmurs, no gallops, no carotid bruit.  Pulmonary: Effort and breath sounds normal, no stridor, rhonchi, wheezes, rales.  Abdominal: Soft. BS +, no distension, tenderness, rebound or guarding.  Musculoskeletal: Normal range of motion. No edema and no tenderness.  Lymphadenopathy: No lymphadenopathy noted, cervical, inguinal or axillary Neuro: Alert. Normal reflexes, muscle tone coordination. No cranial nerve deficit. Skin: Skin is warm and dry. No rash noted. Not diaphoretic. No erythema. No pallor. Psychiatric: Normal mood and affect. Behavior, judgment, thought content normal.   Data Review   Micro Results No results found for this or any previous visit (from the past 240 hour(s)).   CBC No results for input(s): "WBC", "HGB", "HCT", "PLT", "MCV", "MCH", "MCHC", "RDW", "LYMPHSABS", "MONOABS", "EOSABS", "BASOSABS", "BANDABS" in the last 168 hours.  Invalid input(s): "NEUTRABS", "BANDSABD"  Chemistries  No results for input(s): "NA", "K", "CL", "CO2", "GLUCOSE", "BUN", "CREATININE", "CALCIUM", "MG", "AST", "ALT", "ALKPHOS", "BILITOT" in the last 168 hours.  Invalid input(s):  "GFRCGP" ------------------------------------------------------------------------------------------------------------------ estimated creatinine clearance is 53.1 mL/min (by C-G formula based on SCr of 0.49 mg/dL). ------------------------------------------------------------------------------------------------------------------ No results for input(s): "HGBA1C" in the last 72 hours. ------------------------------------------------------------------------------------------------------------------ No results for input(s): "CHOL", "HDL", "LDLCALC", "TRIG", "CHOLHDL", "LDLDIRECT" in the last 72 hours. ------------------------------------------------------------------------------------------------------------------ No results for  input(s): "TSH", "T4TOTAL", "T3FREE", "THYROIDAB" in the last 72 hours.  Invalid input(s): "FREET3" ------------------------------------------------------------------------------------------------------------------ No results for input(s): "VITAMINB12", "FOLATE", "FERRITIN", "TIBC", "IRON", "RETICCTPCT" in the last 72 hours.  Coagulation profile No results for input(s): "INR", "PROTIME" in the last 168 hours.  No results for input(s): "DDIMER" in the last 72 hours.  Cardiac Enzymes No results for input(s): "CKMB", "TROPONINI", "MYOGLOBIN" in the last 168 hours.  Invalid input(s): "CK" ------------------------------------------------------------------------------------------------------------------ Invalid input(s): "POCBNP"  Return for as otherwise scheduled.   Time Spent in minutes  45 Time spent with patient included reviewing progress notes, labs, imaging studies, and discussing plan for follow up.   [place] Controlled Substance Database was reviewed by me for overdose risk score (ORS)  This patient was seen by Jonetta Osgood, FNP-C in collaboration with Dr. Clayborn Bigness as a part of collaborative care agreement.    Jonetta Osgood MSN, FNP-C on 04/28/2022 at  12:43 PM   **Disclaimer: This note may have been dictated with voice recognition software. Similar sounding words can inadvertently be transcribed and this note may contain transcription errors which may not have been corrected upon publication of note.**

## 2022-05-16 ENCOUNTER — Ambulatory Visit: Payer: Medicare PPO | Admitting: Physician Assistant

## 2022-05-23 DIAGNOSIS — G4730 Sleep apnea, unspecified: Secondary | ICD-10-CM | POA: Diagnosis not present

## 2022-05-23 DIAGNOSIS — M25551 Pain in right hip: Secondary | ICD-10-CM | POA: Diagnosis not present

## 2022-05-23 DIAGNOSIS — M25651 Stiffness of right hip, not elsewhere classified: Secondary | ICD-10-CM | POA: Diagnosis not present

## 2022-05-27 DIAGNOSIS — S72034A Nondisplaced midcervical fracture of right femur, initial encounter for closed fracture: Secondary | ICD-10-CM | POA: Diagnosis not present

## 2022-05-31 DIAGNOSIS — M25651 Stiffness of right hip, not elsewhere classified: Secondary | ICD-10-CM | POA: Diagnosis not present

## 2022-05-31 DIAGNOSIS — M25551 Pain in right hip: Secondary | ICD-10-CM | POA: Diagnosis not present

## 2022-06-02 ENCOUNTER — Ambulatory Visit: Payer: Medicare PPO | Admitting: Physician Assistant

## 2022-06-02 ENCOUNTER — Encounter: Payer: Self-pay | Admitting: Physician Assistant

## 2022-06-02 VITALS — BP 135/76 | HR 74 | Temp 98.0°F | Resp 16 | Ht 61.0 in | Wt 147.0 lb

## 2022-06-02 DIAGNOSIS — E871 Hypo-osmolality and hyponatremia: Secondary | ICD-10-CM | POA: Diagnosis not present

## 2022-06-02 DIAGNOSIS — D62 Acute posthemorrhagic anemia: Secondary | ICD-10-CM | POA: Diagnosis not present

## 2022-06-02 DIAGNOSIS — E538 Deficiency of other specified B group vitamins: Secondary | ICD-10-CM

## 2022-06-02 DIAGNOSIS — D509 Iron deficiency anemia, unspecified: Secondary | ICD-10-CM

## 2022-06-02 NOTE — Progress Notes (Signed)
[facility] [place], [place] [address]  Internal MEDICINE  Office Visit Note  Patient Name: Hailey Wong  [id number]  [id number]  Date of Service: 06/13/2022  Chief Complaint  Patient presents with   Follow-up   Hypertension    HPI Pt is here for routine follow up and is doing better today -She continues to follow up with ortho after hip fracture that occurred in Dec and is improving. -takes 2000 b12 gummies after 3 rounds of B12 injections -oxygen 2L at night only -She is interested in rechecking B12 now and still needs to have cbc and cmp rechecked that were previously ordered at hospital follow up. Will order all 3 together now  Current Medication: Outpatient Encounter Medications as of 06/02/2022  Medication Sig   calcium carbonate (OS-CAL) 600 MG TABS tablet Take 600 mg by mouth 2 (two) times daily with a meal.   Cholecalciferol (D3 ADULT PO) Take 1,000 Units by mouth daily.   ferrous sulfate 325 (65 FE) MG tablet Take 1 tablet (325 mg total) by mouth daily with breakfast.   fluticasone (FLONASE) 50 MCG/ACT nasal spray Place 1 spray into both nostrils daily as needed for allergies or rhinitis.   HYDROcodone-acetaminophen (NORCO/VICODIN) 5-325 MG tablet Take 1-2 tablets by mouth every 4 (four) hours as needed for moderate pain (pain score 4-6).   ibandronate (BONIVA) 150 MG tablet Take in the morning with a full glass of water, on an empty stomach, and do not take anything else by mouth or lie down for the next 30 min.   levothyroxine (SYNTHROID) 137 MCG tablet Take 137 mcg by mouth daily before breakfast.   lisinopril (ZESTRIL) 10 MG tablet Take 1 tablet (10 mg total) by mouth 2 (two) times daily.   methocarbamol (ROBAXIN) 500 MG tablet Take 1 tablet (500 mg total) by mouth every 6 (six) hours as needed for muscle spasms.   polyethylene glycol (MIRALAX / GLYCOLAX) 17 g packet Take 17 g by mouth daily.   rosuvastatin (CRESTOR) 5 MG tablet TAKE 1 TABLET  BY MOUTH ONCE EVERY EVENING   Sennosides-Docusate Sodium 8.6-50 MG CAPS Take by mouth.   No facility-administered encounter medications on file as of 06/02/2022.    Surgical History: Past Surgical History:  Procedure Laterality Date   BROW LIFT Bilateral 12/13/2019   Procedure: BLEPHAROPLASTY UPPER EYELID; W/EXCESS SKIN BLEPHAROPTOSIS REPAIR; RESECT EX;  Surgeon: Karle Starch, MD;  Location: [place];  Service: Ophthalmology;  Laterality: Bilateral;   CATARACT EXTRACTION W/PHACO Right 07/21/2021   Procedure: CATARACT EXTRACTION PHACO AND INTRAOCULAR LENS PLACEMENT ([place]) RIGHT 12.60 01:30.4;  Surgeon: Leandrew Koyanagi, MD;  Location: [place];  Service: Ophthalmology;  Laterality: Right;   CATARACT EXTRACTION W/PHACO Left 08/04/2021   Procedure: CATARACT EXTRACTION PHACO AND INTRAOCULAR LENS PLACEMENT ([facility]) LEFT 6.26 01:02.6;  Surgeon: Leandrew Koyanagi, MD;  Location: [place];  Service: Ophthalmology;  Laterality: Left;   COLONOSCOPY WITH PROPOFOL N/A 03/02/2021   Procedure: COLONOSCOPY WITH PROPOFOL;  Surgeon: Jonathon Bellows, MD;  Location: [facility];  Service: Gastroenterology;  Laterality: N/A;   HIP ARTHROPLASTY Right 04/16/2022   Procedure: ARTHROPLASTY BIPOLAR HIP (HEMIARTHROPLASTY);  Surgeon: Earnestine Leys, MD;  Location: [facility];  Service: Orthopedics;  Laterality: Right;   right ankle surgery  1997   THYROIDECTOMY Bilateral 10/20/2020   Procedure: THYROIDECTOMY;  Surgeon: Beverly Gust, MD;  Location: [facility];  Service: ENT;  Laterality: Bilateral;   [place] History:  Past Medical History:  Diagnosis Date   Chronic cough    History of COVID-19    04/2021   Hypertension    Hypothyroidism    Mild cardiomegaly    Sinus trouble    Sleep apnea    no CPAP    Family History: Family History  Problem Relation Age of Onset   Hypertension Mother    Diabetes Mother    Atrial fibrillation Mother    Hypertension  Father    Pulmonary fibrosis Father    Breast cancer Sister     Social History   Socioeconomic History   Marital status: Married    Spouse name: Not on file   Number of children: Not on file   Years of education: Not on file   Highest education level: Not on file  Occupational History   Not on file  Tobacco Use   Smoking status: Every Day    Packs/day: 1.00    Years: 50.00    Total pack years: 50.00    Types: Cigarettes   Smokeless tobacco: Never   Tobacco comments:    1 pack and a half daily  Vaping Use   Vaping Use: Never used  Substance and Sexual Activity   Alcohol use: Yes    Alcohol/week: 7.0 standard drinks of alcohol    Types: 7 Glasses of wine per week    Comment: wine daily   Drug use: Never   Sexual activity: Not on file  Other Topics Concern   Not on file  Social History Narrative   Not on file   Social Determinants of Health   Financial Resource Strain: Not on file  Food Insecurity: No Food Insecurity (04/16/2022)   Hunger Vital Sign    Worried About Running Out of Food in the Last Year: Never true    Ran Out of Food in the Last Year: Never true  Transportation Needs: No Transportation Needs (04/16/2022)   PRAPARE - Hydrologist (Medical): No    Lack of Transportation (Non-Medical): No  Physical Activity: Not on file  Stress: Not on file  Social Connections: Not on file  Intimate Partner Violence: Not At Risk (04/16/2022)   Humiliation, Afraid, Rape, and Kick questionnaire    Fear of Current or Ex-Partner: No    Emotionally Abused: No    Physically Abused: No    Sexually Abused: No      Review of Systems  Constitutional:  Negative for chills, fatigue and unexpected weight change.  HENT:  Negative for congestion, postnasal drip, rhinorrhea, sneezing and sore throat.   Eyes:  Negative for redness.  Respiratory:  Negative for cough, chest tightness, shortness of breath and wheezing.   Cardiovascular:  Negative  for chest pain and palpitations.  Gastrointestinal:  Negative for abdominal pain, constipation, diarrhea, nausea and vomiting.  Genitourinary:  Negative for dysuria and frequency.  Musculoskeletal:  Positive for arthralgias. Negative for back pain, joint swelling and neck pain.  Skin:  Negative for rash.  Neurological: Negative.  Negative for tremors and numbness.  Hematological:  Negative for adenopathy. Does not bruise/bleed easily.  Psychiatric/Behavioral:  Negative for behavioral problems (Depression), sleep disturbance and suicidal ideas. The patient is not nervous/anxious.     Vital Signs: BP 135/76   Pulse 74   Temp 98 F (36.7 C)   Resp 16   Ht 5' 1"$  (1.549 m)   Wt 147 lb (66.7 kg)   SpO2 98%   BMI 27.78 kg/m  Physical Exam Vitals and nursing note reviewed.  Constitutional:      Appearance: Normal appearance.  HENT:     Head: Normocephalic and atraumatic.  Eyes:     Pupils: Pupils are equal, round, and reactive to light.  Cardiovascular:     Rate and Rhythm: Normal rate and regular rhythm.  Pulmonary:     Effort: Pulmonary effort is normal.     Breath sounds: Normal breath sounds. No wheezing.  Musculoskeletal:        General: Normal range of motion.  Skin:    General: Skin is warm and dry.  Neurological:     General: No focal deficit present.     Mental Status: She is alert and oriented to person, place, and time.  Psychiatric:        Mood and Affect: Mood normal.        Behavior: Behavior normal.        Thought Content: Thought content normal.        Judgment: Judgment normal.        Assessment/Plan: 1. Hyponatremia Will reorder labs for monitoring - Comprehensive metabolic panel  2. B12 deficiency - B12 and Folate Panel  3. Postoperative anemia due to acute blood loss Will reorder labs for monitoring - CBC w/Diff/Platelet   General Counseling: Franshesca verbalizes understanding of the findings of todays visit and agrees with plan of  treatment. I have discussed any further diagnostic evaluation that may be needed or ordered today. We also reviewed her medications today. she has been encouraged to call the office with any questions or concerns that should arise related to todays visit.    Orders Placed This Encounter  Procedures   B12 and Folate Panel   CBC w/Diff/Platelet   Comprehensive metabolic panel    No orders of the defined types were placed in this encounter.   This patient was seen by Drema Dallas, PA-C in collaboration with Dr. Clayborn Bigness as a part of collaborative care agreement.   Total time spent:30 Minutes Time spent includes review of chart, medications, test results, and follow up plan with the patient.      Dr Lavera Guise Internal medicine

## 2022-06-15 DIAGNOSIS — M25551 Pain in right hip: Secondary | ICD-10-CM | POA: Diagnosis not present

## 2022-06-15 DIAGNOSIS — M25651 Stiffness of right hip, not elsewhere classified: Secondary | ICD-10-CM | POA: Diagnosis not present

## 2022-06-20 DIAGNOSIS — D62 Acute posthemorrhagic anemia: Secondary | ICD-10-CM | POA: Diagnosis not present

## 2022-06-20 DIAGNOSIS — E538 Deficiency of other specified B group vitamins: Secondary | ICD-10-CM | POA: Diagnosis not present

## 2022-06-20 DIAGNOSIS — Z85850 Personal history of malignant neoplasm of thyroid: Secondary | ICD-10-CM | POA: Diagnosis not present

## 2022-06-20 DIAGNOSIS — L723 Sebaceous cyst: Secondary | ICD-10-CM | POA: Diagnosis not present

## 2022-06-20 DIAGNOSIS — E871 Hypo-osmolality and hyponatremia: Secondary | ICD-10-CM | POA: Diagnosis not present

## 2022-06-21 DIAGNOSIS — M25551 Pain in right hip: Secondary | ICD-10-CM | POA: Diagnosis not present

## 2022-06-21 DIAGNOSIS — M25651 Stiffness of right hip, not elsewhere classified: Secondary | ICD-10-CM | POA: Diagnosis not present

## 2022-06-21 LAB — CBC WITH DIFFERENTIAL/PLATELET
Basophils Absolute: 0.1 10*3/uL (ref 0.0–0.2)
Basos: 1 %
EOS (ABSOLUTE): 0.2 10*3/uL (ref 0.0–0.4)
Eos: 2 %
Hematocrit: 39 % (ref 34.0–46.6)
Hemoglobin: 13 g/dL (ref 11.1–15.9)
Immature Grans (Abs): 0 10*3/uL (ref 0.0–0.1)
Immature Granulocytes: 0 %
Lymphocytes Absolute: 2.3 10*3/uL (ref 0.7–3.1)
Lymphs: 31 %
MCH: 29.1 pg (ref 26.6–33.0)
MCHC: 33.3 g/dL (ref 31.5–35.7)
MCV: 87 fL (ref 79–97)
Monocytes Absolute: 0.6 10*3/uL (ref 0.1–0.9)
Monocytes: 9 %
Neutrophils Absolute: 4.1 10*3/uL (ref 1.4–7.0)
Neutrophils: 57 %
Platelets: 338 10*3/uL (ref 150–450)
RBC: 4.47 x10E6/uL (ref 3.77–5.28)
RDW: 12.7 % (ref 11.7–15.4)
WBC: 7.3 10*3/uL (ref 3.4–10.8)

## 2022-06-21 LAB — B12 AND FOLATE PANEL
Folate: 9.7 ng/mL (ref >3.0)
Vitamin B-12: 765 pg/mL (ref 232–1245)

## 2022-06-21 LAB — COMPREHENSIVE METABOLIC PANEL
ALT: 8 IU/L (ref 0–32)
AST: 16 IU/L (ref 0–40)
Albumin/Globulin Ratio: 2 (ref 1.2–2.2)
Albumin: 4.3 g/dL (ref 3.8–4.8)
Alkaline Phosphatase: 79 IU/L (ref 44–121)
BUN/Creatinine Ratio: 13 (ref 12–28)
BUN: 8 mg/dL (ref 8–27)
Bilirubin Total: 0.3 mg/dL (ref 0.0–1.2)
CO2: 25 mmol/L (ref 20–29)
Calcium: 9.6 mg/dL (ref 8.7–10.3)
Chloride: 97 mmol/L (ref 96–106)
Creatinine, Ser: 0.63 mg/dL (ref 0.57–1.00)
Globulin, Total: 2.2 g/dL (ref 1.5–4.5)
Glucose: 108 mg/dL — ABNORMAL HIGH (ref 70–99)
Potassium: 4.2 mmol/L (ref 3.5–5.2)
Sodium: 135 mmol/L (ref 134–144)
Total Protein: 6.5 g/dL (ref 6.0–8.5)
eGFR: 91 mL/min/{1.73_m2} (ref >59)

## 2022-06-22 ENCOUNTER — Telehealth: Payer: Self-pay

## 2022-06-22 ENCOUNTER — Other Ambulatory Visit: Payer: Self-pay

## 2022-06-22 MED ORDER — FERROUS SULFATE 325 (65 FE) MG PO TABS: 325.0000 mg | ORAL_TABLET | Freq: Every day | ORAL | 3 refills | Status: DC

## 2022-06-22 NOTE — Telephone Encounter (Signed)
Pt was notified for labs

## 2022-06-22 NOTE — Telephone Encounter (Signed)
-----   Message from Mylinda Latina, PA-C sent at 06/22/2022  1:12 PM EST ----- Please let her know that her labs have improved and her sodium, calcium, and hemoglobin are all back to normal. Also her B12 is greatly improved.

## 2022-06-23 DIAGNOSIS — G4730 Sleep apnea, unspecified: Secondary | ICD-10-CM | POA: Diagnosis not present

## 2022-06-28 DIAGNOSIS — M25551 Pain in right hip: Secondary | ICD-10-CM | POA: Diagnosis not present

## 2022-06-28 DIAGNOSIS — M25651 Stiffness of right hip, not elsewhere classified: Secondary | ICD-10-CM | POA: Diagnosis not present

## 2022-06-29 ENCOUNTER — Ambulatory Visit (INDEPENDENT_AMBULATORY_CARE_PROVIDER_SITE_OTHER): Payer: Medicare PPO | Admitting: Internal Medicine

## 2022-06-29 DIAGNOSIS — F17210 Nicotine dependence, cigarettes, uncomplicated: Secondary | ICD-10-CM | POA: Diagnosis not present

## 2022-06-29 DIAGNOSIS — J439 Emphysema, unspecified: Secondary | ICD-10-CM

## 2022-07-05 DIAGNOSIS — M25551 Pain in right hip: Secondary | ICD-10-CM | POA: Diagnosis not present

## 2022-07-05 DIAGNOSIS — M25651 Stiffness of right hip, not elsewhere classified: Secondary | ICD-10-CM | POA: Diagnosis not present

## 2022-07-08 DIAGNOSIS — S72034A Nondisplaced midcervical fracture of right femur, initial encounter for closed fracture: Secondary | ICD-10-CM | POA: Diagnosis not present

## 2022-07-12 IMAGING — NM NM [ID] THYROID CANCER METS SP CA TX
2 series · 4 of 4 positions shown · non-contrast
Comparison: None.

CLINICAL DATA: Post I 131 remnant thyroid tissue ablation post
thyroidectomy for thyroid carcinoma.

EXAM:
NUCLEAR MEDICINE K-ILI POST THERAPY WHOLE BODY SCAN
TECHNIQUE: The patient received 41.4 mCi K-ILI sodium iodide for the treatment
of thyroid cancer within the past 10 days. The patient returns
today, and whole body scanning was performed in the anterior and
posterior projections.

[Series 1000: wholebody 7 days post ca tx · 2.40mm/px · 2 of 2 frames shown]
[frame 1/2]
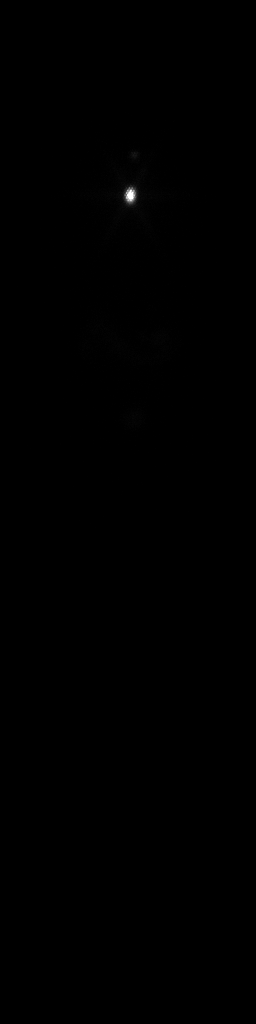
[frame 2/2]
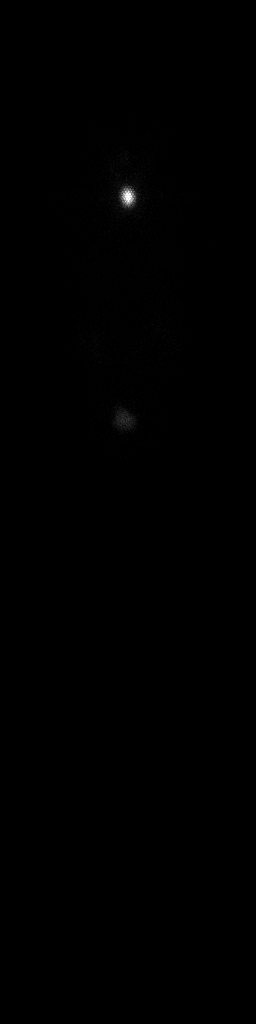

[Series 1000: statics · 2.40mm/px · 2 of 2 frames shown]
[frame 1/2]
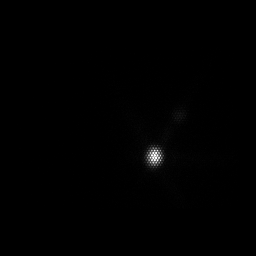
[frame 2/2]
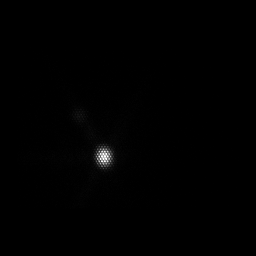

[4 of 4 positions shown; findings below may reference images not displayed]

FINDINGS: Intense activity within the thyroid bed related to remnant thyroid
tissue. No evidence of metastatic adenopathy within the neck.
Intense activity anteriorly at the level of the oropharynx.
IMPRESSION: 1. No evidence of metastatic thyroid carcinoma.
2. Remnant thyroid tissue within the thyroid bed.
3. Intense activity midline in the region oropharynx suggest ectopic
lingual thyroid tissue.

## 2022-07-18 ENCOUNTER — Ambulatory Visit: Payer: Medicare PPO | Admitting: Physician Assistant

## 2022-07-18 NOTE — Procedures (Signed)
[facility] [address] [place], [address]    Complete Pulmonary Function Testing Interpretation:  FINDINGS:  Forced vital capacity is normal FEV1 is normal.  FEV1 FVC ratio is mildly decreased.  Postbronchodilator no significant change in FEV1 is noted.  Total lung capacity is normal residual is normal FRC is decreased.  DLCO is normal.  IMPRESSION:  This pulmonary function study is within normal limits  Allyne Gee, MD [facility] Critical [facility] Medicine Sleep Medicine

## 2022-07-22 DIAGNOSIS — G4730 Sleep apnea, unspecified: Secondary | ICD-10-CM | POA: Diagnosis not present

## 2022-07-28 ENCOUNTER — Ambulatory Visit: Payer: Medicare PPO | Admitting: Physician Assistant

## 2022-07-28 ENCOUNTER — Encounter: Payer: Self-pay | Admitting: Physician Assistant

## 2022-07-28 VITALS — BP 150/90 | HR 70 | Temp 98.0°F | Resp 16 | Ht 61.0 in | Wt 147.7 lb

## 2022-07-28 DIAGNOSIS — I517 Cardiomegaly: Secondary | ICD-10-CM | POA: Diagnosis not present

## 2022-07-28 DIAGNOSIS — D509 Iron deficiency anemia, unspecified: Secondary | ICD-10-CM | POA: Diagnosis not present

## 2022-07-28 DIAGNOSIS — I10 Essential (primary) hypertension: Secondary | ICD-10-CM

## 2022-07-28 MED ORDER — HYDROCHLOROTHIAZIDE 12.5 MG PO TABS: 12.5000 mg | ORAL_TABLET | Freq: Every day | ORAL | 3 refills | Status: DC

## 2022-07-28 NOTE — Progress Notes (Signed)
[facility] [place], [place] [address]  Internal MEDICINE  Office Visit Note  Patient Name: Hailey Wong  [id number]  [id number]  Date of Service: 07/28/2022  Chief Complaint  Patient presents with   Follow-up    Review PFT    HPI Pt is here for routine follow up -Doing better and able to walk unassisted now after hip surgery -Bp has been up since being off HCTZ after hospital visit, potassium is stable and pt would like to restart. Will recheck labs after restarting -Had also been told to take iron after blood loss anemia in hospital and wonders if she still needs it as the combo of this and calcium causes constipation -PFT normal -Does have history of LVH and mild aortic root dilation. Pt interested in moving forward with follow up echo -She is wearing oxygen at night as well. Discussed considering sleep study due to these and pt reports she has had one before and did have OSA but couldn't tolerate cpap.  Current Medication: Outpatient Encounter Medications as of 07/28/2022  Medication Sig   calcium carbonate (OS-CAL) 600 MG TABS tablet Take 600 mg by mouth 2 (two) times daily with a meal.   Cholecalciferol (D3 ADULT PO) Take 1,000 Units by mouth daily.   ferrous sulfate 325 (65 FE) MG tablet Take 1 tablet (325 mg total) by mouth daily with breakfast.   fluticasone (FLONASE) 50 MCG/ACT nasal spray Place 1 spray into both nostrils daily as needed for allergies or rhinitis.   hydrochlorothiazide (HYDRODIURIL) 12.5 MG tablet Take 1 tablet (12.5 mg total) by mouth daily.   HYDROcodone-acetaminophen (NORCO/VICODIN) 5-325 MG tablet Take 1-2 tablets by mouth every 4 (four) hours as needed for moderate pain (pain score 4-6).   ibandronate (BONIVA) 150 MG tablet Take in the morning with a full glass of water, on an empty stomach, and do not take anything else by mouth or lie down for the next 30 min.   levothyroxine (SYNTHROID) 137 MCG tablet Take 137 mcg by mouth  daily before breakfast.   lisinopril (ZESTRIL) 10 MG tablet Take 1 tablet (10 mg total) by mouth 2 (two) times daily.   methocarbamol (ROBAXIN) 500 MG tablet Take 1 tablet (500 mg total) by mouth every 6 (six) hours as needed for muscle spasms.   polyethylene glycol (MIRALAX / GLYCOLAX) 17 g packet Take 17 g by mouth daily.   rosuvastatin (CRESTOR) 5 MG tablet TAKE 1 TABLET BY MOUTH ONCE EVERY EVENING   Sennosides-Docusate Sodium 8.6-50 MG CAPS Take by mouth.   No facility-administered encounter medications on file as of 07/28/2022.    Surgical History: Past Surgical History:  Procedure Laterality Date   BROW LIFT Bilateral 12/13/2019   Procedure: BLEPHAROPLASTY UPPER EYELID; W/EXCESS SKIN BLEPHAROPTOSIS REPAIR; RESECT EX;  Surgeon: Karle Starch, MD;  Location: [place];  Service: Ophthalmology;  Laterality: Bilateral;   CATARACT EXTRACTION W/PHACO Right 07/21/2021   Procedure: CATARACT EXTRACTION PHACO AND INTRAOCULAR LENS PLACEMENT ([place]) RIGHT 12.60 01:30.4;  Surgeon: Leandrew Koyanagi, MD;  Location: [place];  Service: Ophthalmology;  Laterality: Right;   CATARACT EXTRACTION W/PHACO Left 08/04/2021   Procedure: CATARACT EXTRACTION PHACO AND INTRAOCULAR LENS PLACEMENT (I[facility]) LEFT 6.26 01:02.6;  Surgeon: Leandrew Koyanagi, MD;  Location: [place];  Service: Ophthalmology;  Laterality: Left;   COLONOSCOPY WITH PROPOFOL N/A 03/02/2021   Procedure: COLONOSCOPY WITH PROPOFOL;  Surgeon: Jonathon Bellows, MD;  Location: [facility];  Service: Gastroenterology;  Laterality: N/A;  HIP ARTHROPLASTY Right 04/16/2022   Procedure: ARTHROPLASTY BIPOLAR HIP (HEMIARTHROPLASTY);  Surgeon: Earnestine Leys, MD;  Location: [facility];  Service: Orthopedics;  Laterality: Right;   right ankle surgery  1997   THYROIDECTOMY Bilateral 10/20/2020   Procedure: THYROIDECTOMY;  Surgeon: Beverly Gust, MD;  Location: [facility];  Service: ENT;  Laterality: Bilateral;   TUBAL LIGATION   1977    Medical History: Past Medical History:  Diagnosis Date   Chronic cough    History of COVID-19    04/2021   Hypertension    Hypothyroidism    Mild cardiomegaly    Sinus trouble    Sleep apnea    no CPAP    Family History: Family History  Problem Relation Age of Onset   Hypertension Mother    Diabetes Mother    Atrial fibrillation Mother    Hypertension Father    Pulmonary fibrosis Father    Breast cancer Sister     Social History   Socioeconomic History   Marital status: Married    Spouse name: Not on file   Number of children: Not on file   Years of education: Not on file   Highest education level: Not on file  Occupational History   Not on file  Tobacco Use   Smoking status: Every Day    Packs/day: 1.00    Years: 50.00    Additional pack years: 0.00    Total pack years: 50.00    Types: Cigarettes   Smokeless tobacco: Never   Tobacco comments:    1 pack daily  Vaping Use   Vaping Use: Never used  Substance and Sexual Activity   Alcohol use: Yes    Alcohol/week: 7.0 standard drinks of alcohol    Types: 7 Glasses of wine per week    Comment: wine daily   Drug use: Never   Sexual activity: Not on file  Other Topics Concern   Not on file  Social History Narrative   Not on file   Social Determinants of Health   Financial Resource Strain: Not on file  Food Insecurity: No Food Insecurity (04/16/2022)   Hunger Vital Sign    Worried About Running Out of Food in the Last Year: Never true    Ran Out of Food in the Last Year: Never true  Transportation Needs: No Transportation Needs (04/16/2022)   PRAPARE - Hydrologist (Medical): No    Lack of Transportation (Non-Medical): No  Physical Activity: Not on file  Stress: Not on file  Social Connections: Not on file  Intimate Partner Violence: Not At Risk (04/16/2022)   Humiliation, Afraid, Rape, and Kick questionnaire    Fear of Current or Ex-Partner: No    Emotionally  Abused: No    Physically Abused: No    Sexually Abused: No      Review of Systems  Constitutional:  Negative for chills, fatigue and unexpected weight change.  HENT:  Negative for congestion, postnasal drip, rhinorrhea, sneezing and sore throat.   Eyes:  Negative for redness.  Respiratory:  Negative for cough, chest tightness, shortness of breath and wheezing.   Cardiovascular:  Negative for chest pain and palpitations.  Gastrointestinal:  Negative for abdominal pain, constipation, diarrhea, nausea and vomiting.  Genitourinary:  Negative for dysuria and frequency.  Musculoskeletal:  Positive for arthralgias. Negative for back pain, joint swelling and neck pain.  Skin:  Negative for rash.  Neurological: Negative.  Negative for tremors and numbness.  Hematological:  Negative for adenopathy. Does not bruise/bleed easily.  Psychiatric/Behavioral:  Negative for behavioral problems (Depression), sleep disturbance and suicidal ideas. The patient is not nervous/anxious.     Vital Signs: BP (!) 150/90   Pulse 70   Temp 98 F (36.7 C)   Resp 16   Ht 5\' 1"  (1.549 m)   Wt 147 lb 11.2 oz (67 kg)   SpO2 95%   BMI 27.91 kg/m    Physical Exam Vitals and nursing note reviewed.  Constitutional:      Appearance: Normal appearance.  HENT:     Head: Normocephalic and atraumatic.  Eyes:     Pupils: Pupils are equal, round, and reactive to light.  Cardiovascular:     Rate and Rhythm: Normal rate and regular rhythm.  Pulmonary:     Effort: Pulmonary effort is normal.     Breath sounds: Normal breath sounds. No wheezing.  Musculoskeletal:        General: Normal range of motion.  Skin:    General: Skin is warm and dry.  Neurological:     General: No focal deficit present.     Mental Status: She is alert and oriented to person, place, and time.  Psychiatric:        Mood and Affect: Mood normal.        Behavior: Behavior normal.        Thought Content: Thought content normal.         Judgment: Judgment normal.        Assessment/Plan: 1. Essential hypertension Will add HCTZ back and will update labs after being on med for a few weeks. Will also update echo - Comprehensive metabolic panel - hydrochlorothiazide (HYDRODIURIL) 12.5 MG tablet; Take 1 tablet (12.5 mg total) by mouth daily.  Dispense: 90 tablet; Refill: 3 - ECHOCARDIOGRAM COMPLETE; Future  2. Iron deficiency anemia, unspecified iron deficiency anemia type - Fe+TIBC+Fer  3. LVH (left ventricular hypertrophy) - ECHOCARDIOGRAM COMPLETE; Future   General Counseling: Nila verbalizes understanding of the findings of todays visit and agrees with plan of treatment. I have discussed any further diagnostic evaluation that may be needed or ordered today. We also reviewed her medications today. she has been encouraged to call the office with any questions or concerns that should arise related to todays visit.    Orders Placed This Encounter  Procedures   Comprehensive metabolic panel   Fe+TIBC+Fer   ECHOCARDIOGRAM COMPLETE    Meds ordered this encounter  Medications   hydrochlorothiazide (HYDRODIURIL) 12.5 MG tablet    Sig: Take 1 tablet (12.5 mg total) by mouth daily.    Dispense:  90 tablet    Refill:  3    This patient was seen by Drema Dallas, PA-C in collaboration with Dr. Clayborn Bigness as a part of collaborative care agreement.   Total time spent:30 Minutes Time spent includes review of chart, medications, test results, and follow up plan with the patient.      Dr Lavera Guise Internal medicine

## 2022-09-14 DIAGNOSIS — D509 Iron deficiency anemia, unspecified: Secondary | ICD-10-CM | POA: Diagnosis not present

## 2022-09-14 DIAGNOSIS — I10 Essential (primary) hypertension: Secondary | ICD-10-CM | POA: Diagnosis not present

## 2022-09-15 LAB — COMPREHENSIVE METABOLIC PANEL
ALT: 10 IU/L (ref 0–32)
AST: 18 IU/L (ref 0–40)
Albumin/Globulin Ratio: 2 (ref 1.2–2.2)
Albumin: 4.4 g/dL (ref 3.8–4.8)
Alkaline Phosphatase: 62 IU/L (ref 44–121)
BUN/Creatinine Ratio: 18 (ref 12–28)
BUN: 10 mg/dL (ref 8–27)
Bilirubin Total: 0.4 mg/dL (ref 0.0–1.2)
CO2: 24 mmol/L (ref 20–29)
Calcium: 9.2 mg/dL (ref 8.7–10.3)
Chloride: 94 mmol/L — ABNORMAL LOW (ref 96–106)
Creatinine, Ser: 0.56 mg/dL — ABNORMAL LOW (ref 0.57–1.00)
Globulin, Total: 2.2 g/dL (ref 1.5–4.5)
Glucose: 111 mg/dL — ABNORMAL HIGH (ref 70–99)
Potassium: 4.5 mmol/L (ref 3.5–5.2)
Sodium: 131 mmol/L — ABNORMAL LOW (ref 134–144)
Total Protein: 6.6 g/dL (ref 6.0–8.5)
eGFR: 94 mL/min/{1.73_m2} (ref >59)

## 2022-09-15 LAB — IRON,TIBC AND FERRITIN PANEL
Ferritin: 94 ng/mL (ref 15–150)
Iron Saturation: 31 % (ref 15–55)
Iron: 106 ug/dL (ref 27–139)
Total Iron Binding Capacity: 341 ug/dL (ref 250–450)
UIBC: 235 ug/dL (ref 118–369)

## 2022-09-21 ENCOUNTER — Ambulatory Visit
Admission: RE | Admit: 2022-09-21 | Discharge: 2022-09-21 | Disposition: A | Discharge status: Home / Self Care | Payer: Medicare PPO | Source: Ambulatory Visit | Attending: Physician Assistant | Admitting: Physician Assistant

## 2022-09-21 DIAGNOSIS — I10 Essential (primary) hypertension: Secondary | ICD-10-CM

## 2022-09-21 DIAGNOSIS — I517 Cardiomegaly: Secondary | ICD-10-CM | POA: Diagnosis not present

## 2022-09-21 DIAGNOSIS — R0600 Dyspnea, unspecified: Secondary | ICD-10-CM | POA: Insufficient documentation

## 2022-09-21 DIAGNOSIS — I351 Nonrheumatic aortic (valve) insufficiency: Secondary | ICD-10-CM | POA: Insufficient documentation

## 2022-09-21 LAB — ECHOCARDIOGRAM COMPLETE
AR max vel: 2.22 cm2
AV Area VTI: 2.2 cm2
AV Area mean vel: 2.23 cm2
AV Mean grad: 3 mmHg
AV Peak grad: 5.6 mmHg
Ao pk vel: 1.18 m/s
Area-P 1/2: 2.91 cm2
MV VTI: 2.31 cm2
S' Lateral: 2.9 cm

## 2022-09-21 NOTE — Progress Notes (Signed)
*  PRELIMINARY RESULTS* Echocardiogram 2D Echocardiogram has been performed.  Carolyne Fiscal 09/21/2022, 11:05 AM

## 2022-09-29 ENCOUNTER — Ambulatory Visit: Payer: Medicare PPO | Admitting: Physician Assistant

## 2022-09-29 ENCOUNTER — Encounter: Payer: Self-pay | Admitting: Physician Assistant

## 2022-09-29 VITALS — BP 120/83 | HR 62 | Temp 97.9°F | Resp 16 | Ht 61.0 in | Wt 148.0 lb

## 2022-09-29 DIAGNOSIS — E871 Hypo-osmolality and hyponatremia: Secondary | ICD-10-CM | POA: Diagnosis not present

## 2022-09-29 DIAGNOSIS — I517 Cardiomegaly: Secondary | ICD-10-CM

## 2022-09-29 DIAGNOSIS — I10 Essential (primary) hypertension: Secondary | ICD-10-CM | POA: Diagnosis not present

## 2022-09-29 DIAGNOSIS — I77810 Thoracic aortic ectasia: Secondary | ICD-10-CM | POA: Diagnosis not present

## 2022-09-29 DIAGNOSIS — E782 Mixed hyperlipidemia: Secondary | ICD-10-CM

## 2022-09-29 DIAGNOSIS — E559 Vitamin D deficiency, unspecified: Secondary | ICD-10-CM

## 2022-09-29 NOTE — Progress Notes (Signed)
[facility] [address] [place], [place] [address]  Internal MEDICINE  Office Visit Note  Patient Name: Hailey Wong  [id number]  [id number]  Date of Service: 10/07/2022  Chief Complaint  Patient presents with   Follow-up   Hypertension    HPI Pt is here for routine follow up -Recently had updated echo due to hx of LVH and mild aortic root dilatation. Repeat echo showed continued mild aortic root dilatation and mild LVH, normal EF -Again discussed untreated OSA can contribute to these findings, but pt was unable to tolerate CPAP -BP improved since restarting hctz, does continue to have low sodium as a result which is chronic for her. Consider dosing qod, will get a little swelling in fingers when she doesn't take any diuretic, no ankle swelling. Could increase lisinopril if Bp control needed with decreased hctz frequency and will monitor closely  Current Medication: Outpatient Encounter Medications as of 09/29/2022  Medication Sig   calcium carbonate (OS-CAL) 600 MG TABS tablet Take 600 mg by mouth 2 (two) times daily with a meal.   Cholecalciferol (D3 ADULT PO) Take 1,000 Units by mouth daily.   ferrous sulfate 325 (65 FE) MG tablet Take 1 tablet (325 mg total) by mouth daily with breakfast.   fluticasone (FLONASE) 50 MCG/ACT nasal spray Place 1 spray into both nostrils daily as needed for allergies or rhinitis.   hydrochlorothiazide (HYDRODIURIL) 12.5 MG tablet Take 1 tablet (12.5 mg total) by mouth daily.   HYDROcodone-acetaminophen (NORCO/VICODIN) 5-325 MG tablet Take 1-2 tablets by mouth every 4 (four) hours as needed for moderate pain (pain score 4-6).   ibandronate (BONIVA) 150 MG tablet Take in the morning with a full glass of water, on an empty stomach, and do not take anything else by mouth or lie down for the next 30 min.   levothyroxine (SYNTHROID) 137 MCG tablet Take 137 mcg by mouth daily before breakfast.   lisinopril (ZESTRIL) 10 MG tablet Take 1 tablet  (10 mg total) by mouth 2 (two) times daily.   methocarbamol (ROBAXIN) 500 MG tablet Take 1 tablet (500 mg total) by mouth every 6 (six) hours as needed for muscle spasms.   polyethylene glycol (MIRALAX / GLYCOLAX) 17 g packet Take 17 g by mouth daily.   rosuvastatin (CRESTOR) 5 MG tablet TAKE 1 TABLET BY MOUTH ONCE EVERY EVENING   Sennosides-Docusate Sodium 8.6-50 MG CAPS Take by mouth.   No facility-administered encounter medications on file as of 09/29/2022.    Surgical History: Past Surgical History:  Procedure Laterality Date   BROW LIFT Bilateral 12/13/2019   Procedure: BLEPHAROPLASTY UPPER EYELID; W/EXCESS SKIN BLEPHAROPTOSIS REPAIR; RESECT EX;  Surgeon: Imagene Riches, MD;  Location: [facility];  Service: Ophthalmology;  Laterality: Bilateral;   CATARACT EXTRACTION W/PHACO Right 07/21/2021   Procedure: CATARACT EXTRACTION PHACO AND INTRAOCULAR LENS PLACEMENT (IOC) RIGHT 12.60 01:30.4;  Surgeon: Lockie Mola, MD;  Location: [facility];  Service: Ophthalmology;  Laterality: Right;   CATARACT EXTRACTION W/PHACO Left 08/04/2021   Procedure: CATARACT EXTRACTION PHACO AND INTRAOCULAR LENS PLACEMENT (IOC) LEFT 6.26 01:02.6;  Surgeon: Lockie Mola, MD;  Location: [facility];  Service: Ophthalmology;  Laterality: Left;   COLONOSCOPY WITH PROPOFOL N/A 03/02/2021   Procedure: COLONOSCOPY WITH PROPOFOL;  Surgeon: Wyline Mood, MD;  Location: [facility];  Service: Gastroenterology;  Laterality: N/A;   HIP ARTHROPLASTY Right 04/16/2022   Procedure: ARTHROPLASTY BIPOLAR HIP (HEMIARTHROPLASTY);  Surgeon: Deeann Saint, MD;  Location: [facility];  Service: Orthopedics;  Laterality: Right;   right ankle surgery  1997   THYROIDECTOMY Bilateral 10/20/2020   Procedure: THYROIDECTOMY;  Surgeon: Linus Salmons, MD;  Location: [facility];  Service: ENT;  Laterality: Bilateral;   TUBAL LIGATION  1977    Medical History: Past Medical History:  Diagnosis Date   Chronic  cough    History of COVID-19    04/2021   Hypertension    Hypothyroidism    Mild cardiomegaly    Sinus trouble    Sleep apnea    no CPAP    Family History: Family History  Problem Relation Age of Onset   Hypertension Mother    Diabetes Mother    Atrial fibrillation Mother    Hypertension Father    Pulmonary fibrosis Father    Breast cancer Sister     Social History   Socioeconomic History   Marital status: Married    Spouse name: Not on file   Number of children: Not on file   Years of education: Not on file   Highest education level: Not on file  Occupational History   Not on file  Tobacco Use   Smoking status: Every Day    Packs/day: 1.00    Years: 50.00    Additional pack years: 0.00    Total pack years: 50.00    Types: Cigarettes   Smokeless tobacco: Never   Tobacco comments:    1 pack daily  Vaping Use   Vaping Use: Never used  Substance and Sexual Activity   Alcohol use: Yes    Alcohol/week: 7.0 standard drinks of alcohol    Types: 7 Glasses of wine per week    Comment: wine daily   Drug use: Never   Sexual activity: Not on file  Other Topics Concern   Not on file  Social History Narrative   Not on file   Social Determinants of Health   Financial Resource Strain: Not on file  Food Insecurity: No Food Insecurity (04/16/2022)   Hunger Vital Sign    Worried About Running Out of Food in the Last Year: Never true    Ran Out of Food in the Last Year: Never true  Transportation Needs: No Transportation Needs (04/16/2022)   PRAPARE - Administrator, Civil Service (Medical): No    Lack of Transportation (Non-Medical): No  Physical Activity: Not on file  Stress: Not on file  Social Connections: Not on file  Intimate Partner Violence: Not At Risk (04/16/2022)   Humiliation, Afraid, Rape, and Kick questionnaire    Fear of Current or Ex-Partner: No    Emotionally Abused: No    Physically Abused: No    Sexually Abused: No      Review  of Systems  Constitutional:  Negative for chills, fatigue and unexpected weight change.  HENT:  Negative for congestion, postnasal drip, rhinorrhea, sneezing and sore throat.   Eyes:  Negative for redness.  Respiratory:  Negative for cough, chest tightness, shortness of breath and wheezing.   Cardiovascular:  Negative for chest pain and palpitations.  Gastrointestinal:  Negative for abdominal pain, constipation, diarrhea, nausea and vomiting.  Genitourinary:  Negative for dysuria and frequency.  Musculoskeletal:  Negative for arthralgias, back pain, joint swelling and neck pain.  Skin:  Negative for rash.  Neurological: Negative.  Negative for tremors and numbness.  Hematological:  Negative for adenopathy. Does not bruise/bleed easily.  Psychiatric/Behavioral:  Negative for behavioral problems (Depression), sleep disturbance and suicidal ideas. The patient is not nervous/anxious.  Vital Signs: BP 120/83   Pulse 62   Temp 97.9 F (36.6 C)   Resp 16   Ht 5\' 1"  (1.549 m)   Wt 148 lb (67.1 kg)   SpO2 99%   BMI 27.96 kg/m    Physical Exam Vitals and nursing note reviewed.  Constitutional:      Appearance: Normal appearance.  HENT:     Head: Normocephalic and atraumatic.  Eyes:     Pupils: Pupils are equal, round, and reactive to light.  Cardiovascular:     Rate and Rhythm: Normal rate and regular rhythm.  Pulmonary:     Effort: Pulmonary effort is normal.     Breath sounds: Normal breath sounds. No wheezing.  Musculoskeletal:        General: Normal range of motion.  Skin:    General: Skin is warm and dry.  Neurological:     General: No focal deficit present.     Mental Status: She is alert and oriented to person, place, and time.  Psychiatric:        Mood and Affect: Mood normal.        Behavior: Behavior normal.        Thought Content: Thought content normal.        Judgment: Judgment normal.        Assessment/Plan: 1. Essential hypertension Will switch  hydrochlorothiazide to qod to see if this helps with low sodium while still helping with fluid retention and hands.  If blood pressure is rising then could consider increasing lisinopril  2. LVH (left ventricular hypertrophy) Continue to control blood pressure  3. Aortic root dilatation (HCC) Will continue to monitor.  Did discuss untreated sleep apnea can contribute to this and to reconsider updated sleep study and treatment  4. Hyponatremia Will lower dosing of hydrochlorothiazide to every other day to see if this helps and recheck labs - Comprehensive metabolic panel  5. Mixed hyperlipidemia - Lipid Panel With LDL/HDL Ratio  6. Vitamin D deficiency - VITAMIN D 25 Hydroxy (Vit-D Deficiency, Fractures)   General Counseling: Faydra verbalizes understanding of the findings of todays visit and agrees with plan of treatment. I have discussed any further diagnostic evaluation that may be needed or ordered today. We also reviewed her medications today. she has been encouraged to call the office with any questions or concerns that should arise related to todays visit.    Orders Placed This Encounter  Procedures   Comprehensive metabolic panel   Lipid Panel With LDL/HDL Ratio   VITAMIN D 25 Hydroxy (Vit-D Deficiency, Fractures)    No orders of the defined types were placed in this encounter.   This patient was seen by Lynn Ito, PA-C in collaboration with Dr. Beverely Risen as a part of collaborative care agreement.   Total time spent:30 Minutes Time spent includes review of chart, medications, test results, and follow up plan with the patient.      Dr Lyndon Code Internal medicine

## 2022-10-18 DIAGNOSIS — E890 Postprocedural hypothyroidism: Secondary | ICD-10-CM | POA: Diagnosis not present

## 2022-11-01 DIAGNOSIS — E890 Postprocedural hypothyroidism: Secondary | ICD-10-CM | POA: Diagnosis not present

## 2022-11-01 DIAGNOSIS — F17210 Nicotine dependence, cigarettes, uncomplicated: Secondary | ICD-10-CM | POA: Diagnosis not present

## 2022-11-01 DIAGNOSIS — Z85850 Personal history of malignant neoplasm of thyroid: Secondary | ICD-10-CM | POA: Diagnosis not present

## 2022-12-06 ENCOUNTER — Other Ambulatory Visit: Payer: Self-pay | Admitting: Nurse Practitioner

## 2022-12-06 DIAGNOSIS — Z760 Encounter for issue of repeat prescription: Secondary | ICD-10-CM

## 2023-01-02 ENCOUNTER — Other Ambulatory Visit: Payer: Self-pay | Admitting: Internal Medicine

## 2023-01-02 DIAGNOSIS — R300 Dysuria: Secondary | ICD-10-CM

## 2023-01-02 DIAGNOSIS — I10 Essential (primary) hypertension: Secondary | ICD-10-CM

## 2023-01-02 DIAGNOSIS — Z1231 Encounter for screening mammogram for malignant neoplasm of breast: Secondary | ICD-10-CM

## 2023-01-02 DIAGNOSIS — Z0001 Encounter for general adult medical examination with abnormal findings: Secondary | ICD-10-CM

## 2023-01-02 DIAGNOSIS — E538 Deficiency of other specified B group vitamins: Secondary | ICD-10-CM

## 2023-01-02 DIAGNOSIS — M810 Age-related osteoporosis without current pathological fracture: Secondary | ICD-10-CM

## 2023-01-02 DIAGNOSIS — F17210 Nicotine dependence, cigarettes, uncomplicated: Secondary | ICD-10-CM

## 2023-01-02 DIAGNOSIS — E782 Mixed hyperlipidemia: Secondary | ICD-10-CM

## 2023-01-17 DIAGNOSIS — E782 Mixed hyperlipidemia: Secondary | ICD-10-CM | POA: Diagnosis not present

## 2023-01-17 DIAGNOSIS — E559 Vitamin D deficiency, unspecified: Secondary | ICD-10-CM | POA: Diagnosis not present

## 2023-01-18 LAB — COMPREHENSIVE METABOLIC PANEL
ALT: 8 IU/L (ref 0–32)
AST: 15 IU/L (ref 0–40)
Albumin: 4.4 g/dL (ref 3.8–4.8)
Alkaline Phosphatase: 60 IU/L (ref 44–121)
BUN/Creatinine Ratio: 16 (ref 12–28)
BUN: 10 mg/dL (ref 8–27)
Bilirubin Total: 0.5 mg/dL (ref 0.0–1.2)
CO2: 27 mmol/L (ref 20–29)
Calcium: 9.4 mg/dL (ref 8.7–10.3)
Chloride: 94 mmol/L — ABNORMAL LOW (ref 96–106)
Creatinine, Ser: 0.62 mg/dL (ref 0.57–1.00)
Globulin, Total: 1.7 g/dL (ref 1.5–4.5)
Glucose: 107 mg/dL — ABNORMAL HIGH (ref 70–99)
Potassium: 4.5 mmol/L (ref 3.5–5.2)
Sodium: 132 mmol/L — ABNORMAL LOW (ref 134–144)
Total Protein: 6.1 g/dL (ref 6.0–8.5)
eGFR: 92 mL/min/{1.73_m2} (ref >59)

## 2023-01-18 LAB — LIPID PANEL WITH LDL/HDL RATIO
Cholesterol, Total: 181 mg/dL (ref 100–199)
HDL: 105 mg/dL (ref >39)
LDL Chol Calc (NIH): 65 mg/dL (ref 0–99)
LDL/HDL Ratio: 0.6 ratio (ref 0.0–3.2)
Triglycerides: 57 mg/dL (ref 0–149)
VLDL Cholesterol Cal: 11 mg/dL (ref 5–40)

## 2023-01-18 LAB — VITAMIN D 25 HYDROXY (VIT D DEFICIENCY, FRACTURES): Vit D, 25-Hydroxy: 41.8 ng/mL (ref 30.0–100.0)

## 2023-01-26 ENCOUNTER — Telehealth: Payer: Self-pay | Admitting: Physician Assistant

## 2023-01-26 ENCOUNTER — Ambulatory Visit: Payer: Medicare PPO | Admitting: Physician Assistant

## 2023-01-26 ENCOUNTER — Encounter: Payer: Self-pay | Admitting: Physician Assistant

## 2023-01-26 VITALS — BP 130/90 | HR 76 | Temp 98.2°F | Resp 16 | Ht 61.0 in | Wt 150.8 lb

## 2023-01-26 DIAGNOSIS — Z1231 Encounter for screening mammogram for malignant neoplasm of breast: Secondary | ICD-10-CM

## 2023-01-26 DIAGNOSIS — Z0001 Encounter for general adult medical examination with abnormal findings: Secondary | ICD-10-CM

## 2023-01-26 DIAGNOSIS — I10 Essential (primary) hypertension: Secondary | ICD-10-CM | POA: Diagnosis not present

## 2023-01-26 DIAGNOSIS — F17210 Nicotine dependence, cigarettes, uncomplicated: Secondary | ICD-10-CM

## 2023-01-26 DIAGNOSIS — J439 Emphysema, unspecified: Secondary | ICD-10-CM

## 2023-01-26 DIAGNOSIS — E871 Hypo-osmolality and hyponatremia: Secondary | ICD-10-CM

## 2023-01-26 NOTE — Progress Notes (Signed)
[facility] [address] [place], [place] [address]  Internal MEDICINE  Office Visit Note  Patient Name: Hailey Wong  [id number]  [id number]  Date of Service: 01/26/2023  Chief Complaint  Patient presents with   Medicare Wellness   Hypertension    HPI Greydis presents for an annual well visit and physical exam.  Well-appearing 77 y.o.female Routine CRC screening: UTD in 2022 Routine mammogram: Would like to continue screening, due in Nov DEXA scan: Done in 2022 Labs: Sodium still a little low though slightly improved--taking hydrochlorothiazide 4 days per week and will drop to 3 times per week and will drink a little gatorade to help. Vit D and cholesterol stable Other concerns: needs ct lung screening and will order. Also has several moles/lesions and is going to schedule a visit with dermatology. Not seen in a long time. -132/94 on recheck in office, at home 152/79 before BP meds, will take both lisinopril in morning instead of BID. Will call if problem with this and may need just extra 1/2 tab in AM     01/04/2021   10:51 AM 11/26/2019   10:14 AM 11/06/2018    9:25 AM  MMSE - Mini Mental State Exam  Orientation to time 5 5 5   Orientation to Place 5 5 5   Registration 3 3 3   Attention/ Calculation 5 5 5   Recall 3 3 3   Language- name 2 objects 2 2 2   Language- repeat 1 1 1   Language- follow 3 step command 3 3 3   Language- read & follow direction 1 1 1   Write a sentence 1 1 1   Copy design 1 1 1   Total score 30 30 30     Functional Status Survey: Is the patient deaf or have difficulty hearing?: No Does the patient have difficulty seeing, even when wearing glasses/contacts?: No Does the patient have difficulty concentrating, remembering, or making decisions?: No Does the patient have difficulty walking or climbing stairs?: No Does the patient have difficulty dressing or bathing?: No Does the patient have difficulty doing errands alone such as visiting a  doctor's office or shopping?: No     04/19/2022    9:00 PM 04/20/2022    7:20 AM 07/28/2022    9:40 AM 09/29/2022   10:43 AM 01/26/2023    8:55 AM  Fall Risk  Falls in the past year?   1 0 1  Was there an injury with Fall?   1  1  Was there an injury with Fall? - Comments   Broke right hip in December of 2023.  Broken hip  Fall Risk Category Calculator   2  2  (RETIRED) Patient Fall Risk Level High fall risk High fall risk          01/26/2023    8:56 AM  Depression screen PHQ 2/9  Decreased Interest 0  Down, Depressed, Hopeless 0  PHQ - 2 Score 0        No data to display            Current Medication: Outpatient Encounter Medications as of 01/26/2023  Medication Sig   calcium carbonate (OS-CAL) 600 MG TABS tablet Take 600 mg by mouth 2 (two) times daily with a meal.   Cholecalciferol (D3 ADULT PO) Take 1,000 Units by mouth daily.   ferrous sulfate 325 (65 FE) MG tablet Take 1 tablet (325 mg total) by mouth daily with breakfast.   fluticasone (FLONASE) 50 MCG/ACT nasal spray Place 1 spray into  both nostrils daily as needed for allergies or rhinitis.   hydrochlorothiazide (HYDRODIURIL) 12.5 MG tablet Take 1 tablet (12.5 mg total) by mouth daily.   HYDROcodone-acetaminophen (NORCO/VICODIN) 5-325 MG tablet Take 1-2 tablets by mouth every 4 (four) hours as needed for moderate pain (pain score 4-6).   ibandronate (BONIVA) 150 MG tablet TAKE 1 TABLET BY MOUTH ONCE MONTHLY. DO NOT LIE DOWN, EAT FOOD, OR TAKE ANY OTHER MEDS FOR LEAST 60 MINUTES.   levothyroxine (SYNTHROID) 137 MCG tablet Take 137 mcg by mouth daily before breakfast.   lisinopril (ZESTRIL) 10 MG tablet TAKE 1 TABLET BY MOUTH TWICE DAILY   methocarbamol (ROBAXIN) 500 MG tablet Take 1 tablet (500 mg total) by mouth every 6 (six) hours as needed for muscle spasms.   polyethylene glycol (MIRALAX / GLYCOLAX) 17 g packet Take 17 g by mouth daily.   rosuvastatin (CRESTOR) 5 MG tablet TAKE 1 TABLET BY MOUTH ONCE EVERY  EVENING   Sennosides-Docusate Sodium 8.6-50 MG CAPS Take by mouth.   No facility-administered encounter medications on file as of 01/26/2023.    Surgical History: Past Surgical History:  Procedure Laterality Date   BROW LIFT Bilateral 12/13/2019   Procedure: BLEPHAROPLASTY UPPER EYELID; W/EXCESS SKIN BLEPHAROPTOSIS REPAIR; RESECT EX;  Surgeon: Imagene Riches, MD;  Location: [facility];  Service: Ophthalmology;  Laterality: Bilateral;   CATARACT EXTRACTION W/PHACO Right 07/21/2021   Procedure: CATARACT EXTRACTION PHACO AND INTRAOCULAR LENS PLACEMENT (IOC) RIGHT 12.60 01:30.4;  Surgeon: Lockie Mola, MD;  Location: [facility];  Service: Ophthalmology;  Laterality: Right;   CATARACT EXTRACTION W/PHACO Left 08/04/2021   Procedure: CATARACT EXTRACTION PHACO AND INTRAOCULAR LENS PLACEMENT (IOC) LEFT 6.26 01:02.6;  Surgeon: Lockie Mola, MD;  Location: [facility];  Service: Ophthalmology;  Laterality: Left;   COLONOSCOPY WITH PROPOFOL N/A 03/02/2021   Procedure: COLONOSCOPY WITH PROPOFOL;  Surgeon: Wyline Mood, MD;  Location: [facility];  Service: Gastroenterology;  Laterality: N/A;   HIP ARTHROPLASTY Right 04/16/2022   Procedure: ARTHROPLASTY BIPOLAR HIP (HEMIARTHROPLASTY);  Surgeon: Deeann Saint, MD;  Location: [facility];  Service: Orthopedics;  Laterality: Right;   right ankle surgery  1997   THYROIDECTOMY Bilateral 10/20/2020   Procedure: THYROIDECTOMY;  Surgeon: Linus Salmons, MD;  Location: [facility];  Service: ENT;  Laterality: Bilateral;   TUBAL LIGATION  1977    Medical History: Past Medical History:  Diagnosis Date   Chronic cough    History of COVID-19    04/2021   Hypertension    Hypothyroidism    Mild cardiomegaly    Sinus trouble    Sleep apnea    no CPAP    Family History: Family History  Problem Relation Age of Onset   Hypertension Mother    Diabetes Mother    Atrial fibrillation Mother    Hypertension Father     Pulmonary fibrosis Father    Breast cancer Sister     Social History   Socioeconomic History   Marital status: Married    Spouse name: Not on file   Number of children: Not on file   Years of education: Not on file   Highest education level: Not on file  Occupational History   Not on file  Tobacco Use   Smoking status: Every Day    Current packs/day: 1.00    Average packs/day: 1 pack/day for 50.0 years (50.0 ttl pk-yrs)    Types: Cigarettes   Smokeless tobacco: Never   Tobacco comments:    1 pack daily  Vaping Use   Vaping status: Never Used  Substance and Sexual Activity   Alcohol use: Yes    Alcohol/week: 7.0 standard drinks of alcohol    Types: 7 Glasses of wine per week    Comment: wine daily   Drug use: Never   Sexual activity: Not on file  Other Topics Concern   Not on file  Social History Narrative   Not on file   Social Determinants of Health   Financial Resource Strain: Not on file  Food Insecurity: No Food Insecurity (04/16/2022)   Hunger Vital Sign    Worried About Running Out of Food in the Last Year: Never true    Ran Out of Food in the Last Year: Never true  Transportation Needs: No Transportation Needs (04/16/2022)   PRAPARE - Administrator, Civil Service (Medical): No    Lack of Transportation (Non-Medical): No  Physical Activity: Not on file  Stress: Not on file  Social Connections: Not on file  Intimate Partner Violence: Not At Risk (04/16/2022)   Humiliation, Afraid, Rape, and Kick questionnaire    Fear of Current or Ex-Partner: No    Emotionally Abused: No    Physically Abused: No    Sexually Abused: No      Review of Systems  Constitutional:  Negative for chills, fatigue and unexpected weight change.  HENT:  Negative for congestion, postnasal drip, rhinorrhea, sneezing and sore throat.   Eyes:  Negative for redness.  Respiratory:  Negative for cough, chest tightness, shortness of breath and wheezing.   Cardiovascular:   Negative for chest pain and palpitations.  Gastrointestinal:  Negative for abdominal pain, constipation, diarrhea, nausea and vomiting.  Genitourinary:  Negative for dysuria and frequency.  Musculoskeletal:  Negative for arthralgias, back pain, joint swelling and neck pain.  Skin:  Negative for rash.       Some skin lesions  Neurological: Negative.  Negative for tremors and numbness.  Hematological:  Negative for adenopathy. Does not bruise/bleed easily.  Psychiatric/Behavioral:  Negative for behavioral problems (Depression), sleep disturbance and suicidal ideas. The patient is not nervous/anxious.     Vital Signs: BP (!) 130/90   Pulse 76   Temp 98.2 F (36.8 C)   Resp 16   Ht 5\' 1"  (1.549 m)   Wt 150 lb 12.8 oz (68.4 kg)   SpO2 97%   BMI 28.49 kg/m    Physical Exam Vitals and nursing note reviewed.  Constitutional:      Appearance: Normal appearance.  HENT:     Head: Normocephalic and atraumatic.  Eyes:     Pupils: Pupils are equal, round, and reactive to light.  Cardiovascular:     Rate and Rhythm: Normal rate and regular rhythm.  Pulmonary:     Effort: Pulmonary effort is normal.     Breath sounds: Normal breath sounds. No wheezing.  Musculoskeletal:        General: Normal range of motion.  Skin:    General: Skin is warm and dry.     Findings: Lesion present.     Comments: Several moles and SK's along skin, irritating one on right breast and left side of upper back  Neurological:     General: No focal deficit present.     Mental Status: She is alert and oriented to person, place, and time.  Psychiatric:        Mood and Affect: Mood normal.        Behavior: Behavior normal.  Thought Content: Thought content normal.        Judgment: Judgment normal.        Assessment/Plan: 1. Encounter for general adult medical examination with abnormal findings Cpe performed, due for CT lung screening in a few weeks  2. Essential hypertension Borderline in office  and will need to decrease hydrochlorothiazide further due to low sodium. Will take 3 days per week to help any fluid retention. Will this change will adjust lisinopril and take 2omg in AM along rather than 10mg  BID. May need to adjust further  3. Hyponatremia Will decrease hydrochlorothiazide and pt will drink some gatorade to help  4. Cigarette nicotine dependence without complication - CT CHEST LUNG CA SCREEN LOW DOSE W/O CM; Future  5. Pulmonary emphysema, unspecified emphysema type (HCC) - CT CHEST LUNG CA SCREEN LOW DOSE W/O CM; Future  6. Visit for screening mammogram - MM 3D SCREENING MAMMOGRAM BILATERAL BREAST; Future     General Counseling: Elneta verbalizes understanding of the findings of todays visit and agrees with plan of treatment. I have discussed any further diagnostic evaluation that may be needed or ordered today. We also reviewed her medications today. she has been encouraged to call the office with any questions or concerns that should arise related to todays visit.    Orders Placed This Encounter  Procedures   MM 3D SCREENING MAMMOGRAM BILATERAL BREAST   CT CHEST LUNG CA SCREEN LOW DOSE W/O CM    No orders of the defined types were placed in this encounter.   Return in about 3 weeks (around 02/16/2023) for HTN.   Total time spent:35 Minutes Time spent includes review of chart, medications, test results, and follow up plan with the patient.   [place] Controlled Substance Database was reviewed by me.  This patient was seen by Lynn Ito, PA-C in collaboration with Dr. Beverely Risen as a part of collaborative care agreement.  Lynn Ito, PA-C Internal medicine

## 2023-01-26 NOTE — Telephone Encounter (Signed)
Lvm notifying patient of mammogram appointment date, arrival time, location and that chest CT order has been faxed to [facility]

## 2023-02-01 ENCOUNTER — Ambulatory Visit
Admission: RE | Admit: 2023-02-01 | Discharge: 2023-02-01 | Disposition: A | Discharge status: Home / Self Care | Payer: Medicare PPO | Source: Ambulatory Visit | Attending: Physician Assistant | Admitting: Physician Assistant

## 2023-02-01 DIAGNOSIS — F17210 Nicotine dependence, cigarettes, uncomplicated: Secondary | ICD-10-CM

## 2023-02-01 DIAGNOSIS — J439 Emphysema, unspecified: Secondary | ICD-10-CM

## 2023-02-23 ENCOUNTER — Encounter: Payer: Self-pay | Admitting: Physician Assistant

## 2023-02-23 ENCOUNTER — Ambulatory Visit (INDEPENDENT_AMBULATORY_CARE_PROVIDER_SITE_OTHER): Payer: Medicare PPO | Admitting: Physician Assistant

## 2023-02-23 VITALS — BP 142/82 | HR 67 | Temp 97.9°F | Resp 16 | Ht 61.0 in | Wt 151.6 lb

## 2023-02-23 DIAGNOSIS — I700 Atherosclerosis of aorta: Secondary | ICD-10-CM

## 2023-02-23 DIAGNOSIS — I10 Essential (primary) hypertension: Secondary | ICD-10-CM | POA: Diagnosis not present

## 2023-02-23 DIAGNOSIS — F17210 Nicotine dependence, cigarettes, uncomplicated: Secondary | ICD-10-CM

## 2023-03-14 ENCOUNTER — Other Ambulatory Visit: Payer: Self-pay | Admitting: Physician Assistant

## 2023-03-14 DIAGNOSIS — Z961 Presence of intraocular lens: Secondary | ICD-10-CM | POA: Diagnosis not present

## 2023-03-14 DIAGNOSIS — H43813 Vitreous degeneration, bilateral: Secondary | ICD-10-CM | POA: Diagnosis not present

## 2023-03-14 DIAGNOSIS — I6523 Occlusion and stenosis of bilateral carotid arteries: Secondary | ICD-10-CM

## 2023-03-14 DIAGNOSIS — I700 Atherosclerosis of aorta: Secondary | ICD-10-CM

## 2023-03-29 ENCOUNTER — Ambulatory Visit
Admission: RE | Admit: 2023-03-29 | Discharge: 2023-03-29 | Disposition: A | Discharge status: Home / Self Care | Payer: Medicare PPO | Source: Ambulatory Visit | Attending: Physician Assistant | Admitting: Physician Assistant

## 2023-03-29 DIAGNOSIS — Z1231 Encounter for screening mammogram for malignant neoplasm of breast: Secondary | ICD-10-CM | POA: Diagnosis not present

## 2023-04-06 ENCOUNTER — Ambulatory Visit: Payer: Medicare PPO | Admitting: Physician Assistant

## 2023-04-06 ENCOUNTER — Encounter: Payer: Self-pay | Admitting: Physician Assistant

## 2023-04-06 VITALS — BP 142/80 | HR 88 | Temp 98.2°F | Resp 16 | Ht 61.0 in | Wt 150.4 lb

## 2023-04-06 DIAGNOSIS — I10 Essential (primary) hypertension: Secondary | ICD-10-CM | POA: Diagnosis not present

## 2023-04-06 NOTE — Progress Notes (Signed)
[facility] [address] [place], [place] [address]  Internal MEDICINE  Office Visit Note  Patient Name: Hailey Wong  [id number]  [id number]  Date of Service: 04/06/2023  Chief Complaint  Patient presents with   Follow-up   Hypertension    HPI Pt is here for routine follow up -Taking 1.5tab lisinopril in AM and full tab at night, thinking about reversing this. Did discuss increasing to 30mg  Lisinopril total per day if not improving with this change. Currently takes Hctz 1 tab MWF to help with swelling without dropping sodium too much. (May try 1/2 daily instead to avoid BP fluctuations) -BP at home 130-150, but also found home cuff reads about 10points higher in office than manual reading -had her mammogram  Current Medication: Outpatient Encounter Medications as of 04/06/2023  Medication Sig   calcium carbonate (OS-CAL) 600 MG TABS tablet Take 600 mg by mouth 2 (two) times daily with a meal.   Cholecalciferol (D3 ADULT PO) Take 1,000 Units by mouth daily.   ferrous sulfate 325 (65 FE) MG tablet Take 1 tablet (325 mg total) by mouth daily with breakfast.   fluticasone (FLONASE) 50 MCG/ACT nasal spray Place 1 spray into both nostrils daily as needed for allergies or rhinitis.   hydrochlorothiazide (HYDRODIURIL) 12.5 MG tablet Take 1 tablet (12.5 mg total) by mouth daily.   HYDROcodone-acetaminophen (NORCO/VICODIN) 5-325 MG tablet Take 1-2 tablets by mouth every 4 (four) hours as needed for moderate pain (pain score 4-6).   ibandronate (BONIVA) 150 MG tablet TAKE 1 TABLET BY MOUTH ONCE MONTHLY. DO NOT LIE DOWN, EAT FOOD, OR TAKE ANY OTHER MEDS FOR LEAST 60 MINUTES.   levothyroxine (SYNTHROID) 137 MCG tablet Take 137 mcg by mouth daily before breakfast.   lisinopril (ZESTRIL) 10 MG tablet TAKE 1 TABLET BY MOUTH TWICE DAILY   methocarbamol (ROBAXIN) 500 MG tablet Take 1 tablet (500 mg total) by mouth every 6 (six) hours as needed for muscle spasms.   polyethylene glycol  (MIRALAX / GLYCOLAX) 17 g packet Take 17 g by mouth daily.   rosuvastatin (CRESTOR) 5 MG tablet TAKE 1 TABLET BY MOUTH ONCE EVERY EVENING   Sennosides-Docusate Sodium 8.6-50 MG CAPS Take by mouth.   No facility-administered encounter medications on file as of 04/06/2023.    Surgical History: Past Surgical History:  Procedure Laterality Date   BROW LIFT Bilateral 12/13/2019   Procedure: BLEPHAROPLASTY UPPER EYELID; W/EXCESS SKIN BLEPHAROPTOSIS REPAIR; RESECT EX;  Surgeon: Imagene Riches, MD;  Location: [facility];  Service: Ophthalmology;  Laterality: Bilateral;   CATARACT EXTRACTION W/PHACO Right 07/21/2021   Procedure: CATARACT EXTRACTION PHACO AND INTRAOCULAR LENS PLACEMENT (IOC) RIGHT 12.60 01:30.4;  Surgeon: Lockie Mola, MD;  Location: [facility];  Service: Ophthalmology;  Laterality: Right;   CATARACT EXTRACTION W/PHACO Left 08/04/2021   Procedure: CATARACT EXTRACTION PHACO AND INTRAOCULAR LENS PLACEMENT (IOC) LEFT 6.26 01:02.6;  Surgeon: Lockie Mola, MD;  Location: [facility];  Service: Ophthalmology;  Laterality: Left;   COLONOSCOPY WITH PROPOFOL N/A 03/02/2021   Procedure: COLONOSCOPY WITH PROPOFOL;  Surgeon: Wyline Mood, MD;  Location: [facility];  Service: Gastroenterology;  Laterality: N/A;   HIP ARTHROPLASTY Right 04/16/2022   Procedure: ARTHROPLASTY BIPOLAR HIP (HEMIARTHROPLASTY);  Surgeon: Deeann Saint, MD;  Location: [facility];  Service: Orthopedics;  Laterality: Right;   right ankle surgery  1997   THYROIDECTOMY Bilateral 10/20/2020   Procedure: THYROIDECTOMY;  Surgeon: Linus Salmons, MD;  Location: [facility];  Service: ENT;  Laterality: Bilateral;  TUBAL LIGATION  1977    Medical History: Past Medical History:  Diagnosis Date   Chronic cough    History of COVID-19    04/2021   Hypertension    Hypothyroidism    Mild cardiomegaly    Sinus trouble    Sleep apnea    no CPAP    Family History: Family History  Problem  Relation Age of Onset   Hypertension Mother    Diabetes Mother    Atrial fibrillation Mother    Hypertension Father    Pulmonary fibrosis Father    Breast cancer Sister     Social History   Socioeconomic History   Marital status: Married    Spouse name: Not on file   Number of children: Not on file   Years of education: Not on file   Highest education level: Not on file  Occupational History   Not on file  Tobacco Use   Smoking status: Every Day    Current packs/day: 1.00    Average packs/day: 1 pack/day for 50.0 years (50.0 ttl pk-yrs)    Types: Cigarettes   Smokeless tobacco: Never   Tobacco comments:    1 pack daily  Vaping Use   Vaping status: Never Used  Substance and Sexual Activity   Alcohol use: Yes    Alcohol/week: 7.0 standard drinks of alcohol    Types: 7 Glasses of wine per week    Comment: wine daily   Drug use: Never   Sexual activity: Not on file  Other Topics Concern   Not on file  Social History Narrative   Not on file   Social Drivers of Health   Financial Resource Strain: Not on file  Food Insecurity: No Food Insecurity (04/16/2022)   Hunger Vital Sign    Worried About Running Out of Food in the Last Year: Never true    Ran Out of Food in the Last Year: Never true  Transportation Needs: No Transportation Needs (04/16/2022)   PRAPARE - Administrator, Civil Service (Medical): No    Lack of Transportation (Non-Medical): No  Physical Activity: Not on file  Stress: Not on file  Social Connections: Not on file  Intimate Partner Violence: Not At Risk (04/16/2022)   Humiliation, Afraid, Rape, and Kick questionnaire    Fear of Current or Ex-Partner: No    Emotionally Abused: No    Physically Abused: No    Sexually Abused: No      Review of Systems  Constitutional:  Negative for chills, fatigue and unexpected weight change.  HENT:  Negative for congestion, postnasal drip, rhinorrhea, sneezing and sore throat.   Eyes:  Negative  for redness.  Respiratory:  Negative for cough, chest tightness, shortness of breath and wheezing.   Cardiovascular:  Negative for chest pain and palpitations.  Gastrointestinal:  Negative for abdominal pain, constipation, diarrhea, nausea and vomiting.  Genitourinary:  Negative for dysuria and frequency.  Musculoskeletal:  Negative for arthralgias, back pain, joint swelling and neck pain.  Skin:  Negative for rash.  Neurological: Negative.  Negative for tremors and numbness.  Hematological:  Negative for adenopathy. Does not bruise/bleed easily.  Psychiatric/Behavioral:  Negative for behavioral problems (Depression), sleep disturbance and suicidal ideas. The patient is not nervous/anxious.     Vital Signs: BP (!) 142/80 Comment: 150/80  Pulse 88   Temp 98.2 F (36.8 C)   Resp 16   Ht 5\' 1"  (1.549 m)   Wt 150 lb 6.4 oz (68.2  kg)   SpO2 97%   BMI 28.42 kg/m    Physical Exam Vitals and nursing note reviewed.  Constitutional:      Appearance: Normal appearance.  HENT:     Head: Normocephalic and atraumatic.  Eyes:     Pupils: Pupils are equal, round, and reactive to light.  Cardiovascular:     Rate and Rhythm: Normal rate and regular rhythm.  Pulmonary:     Effort: Pulmonary effort is normal.     Breath sounds: Normal breath sounds. No wheezing.  Musculoskeletal:        General: Normal range of motion.  Skin:    General: Skin is warm and dry.  Neurological:     General: No focal deficit present.     Mental Status: She is alert and oriented to person, place, and time.  Psychiatric:        Mood and Affect: Mood normal.        Behavior: Behavior normal.        Thought Content: Thought content normal.        Judgment: Judgment normal.        Assessment/Plan: 1. Essential hypertension (Primary) Will adjust timing of medications. May increase to 30mg  Lisinopril per day if not improving. Could also consider 1/2 tab hydrochlorothiazide daily instead of full tab MWF. Pt  will make 1 change at a time and monitor.   General Counseling: nickola willauer understanding of the findings of todays visit and agrees with plan of treatment. I have discussed any further diagnostic evaluation that may be needed or ordered today. We also reviewed her medications today. she has been encouraged to call the office with any questions or concerns that should arise related to todays visit.    No orders of the defined types were placed in this encounter.   No orders of the defined types were placed in this encounter.   This patient was seen by Lynn Ito, PA-C in collaboration with Dr. Beverely Risen as a part of collaborative care agreement.   Total time spent:30 Minutes Time spent includes review of chart, medications, test results, and follow up plan with the patient.      Dr Lyndon Code Internal medicine

## 2023-05-02 DIAGNOSIS — E890 Postprocedural hypothyroidism: Secondary | ICD-10-CM | POA: Diagnosis not present

## 2023-05-09 DIAGNOSIS — F17210 Nicotine dependence, cigarettes, uncomplicated: Secondary | ICD-10-CM | POA: Diagnosis not present

## 2023-05-09 DIAGNOSIS — E890 Postprocedural hypothyroidism: Secondary | ICD-10-CM | POA: Diagnosis not present

## 2023-05-09 DIAGNOSIS — Z85850 Personal history of malignant neoplasm of thyroid: Secondary | ICD-10-CM | POA: Diagnosis not present

## 2023-05-18 ENCOUNTER — Ambulatory Visit: Payer: Medicare PPO | Admitting: Physician Assistant

## 2023-05-26 ENCOUNTER — Encounter: Payer: Self-pay | Admitting: Physician Assistant

## 2023-05-26 ENCOUNTER — Ambulatory Visit: Payer: Medicare PPO | Admitting: Physician Assistant

## 2023-05-26 VITALS — BP 109/70 | HR 64 | Temp 97.8°F | Resp 16 | Ht 61.0 in | Wt 151.6 lb

## 2023-05-26 DIAGNOSIS — I10 Essential (primary) hypertension: Secondary | ICD-10-CM | POA: Diagnosis not present

## 2023-05-26 DIAGNOSIS — E871 Hypo-osmolality and hyponatremia: Secondary | ICD-10-CM | POA: Diagnosis not present

## 2023-05-26 DIAGNOSIS — R7303 Prediabetes: Secondary | ICD-10-CM

## 2023-05-26 DIAGNOSIS — R5383 Other fatigue: Secondary | ICD-10-CM | POA: Diagnosis not present

## 2023-05-26 NOTE — Progress Notes (Signed)
[facility] [address] [place], [place] [address]  Internal MEDICINE  Office Visit Note  Patient Name: Hailey Wong  [id number]  [id number]  Date of Service: 06/07/2023  Chief Complaint  Patient presents with   Follow-up   Hypertension    HPI Pt is here for routine follow up -Taking 1.5 tabs lisinopril in AM and 1 tab at night, then takes 1/2 hydrochlorothiazide daily now and seems to be doing better -Her cuff was found to read higher than manual in office, so she adjusts accordingly and has been improved now -no dizziness -will order labs to be updated  Current Medication: Outpatient Encounter Medications as of 05/26/2023  Medication Sig   calcium carbonate (OS-CAL) 600 MG TABS tablet Take 600 mg by mouth 2 (two) times daily with a meal.   Cholecalciferol (D3 ADULT PO) Take 1,000 Units by mouth daily.   ferrous sulfate 325 (65 FE) MG tablet Take 1 tablet (325 mg total) by mouth daily with breakfast.   fluticasone (FLONASE) 50 MCG/ACT nasal spray Place 1 spray into both nostrils daily as needed for allergies or rhinitis.   HYDROcodone-acetaminophen (NORCO/VICODIN) 5-325 MG tablet Take 1-2 tablets by mouth every 4 (four) hours as needed for moderate pain (pain score 4-6).   ibandronate (BONIVA) 150 MG tablet TAKE 1 TABLET BY MOUTH ONCE MONTHLY. DO NOT LIE DOWN, EAT FOOD, OR TAKE ANY OTHER MEDS FOR LEAST 60 MINUTES.   levothyroxine (SYNTHROID) 137 MCG tablet Take 137 mcg by mouth daily before breakfast.   lisinopril (ZESTRIL) 10 MG tablet TAKE 1 TABLET BY MOUTH TWICE DAILY   methocarbamol (ROBAXIN) 500 MG tablet Take 1 tablet (500 mg total) by mouth every 6 (six) hours as needed for muscle spasms.   polyethylene glycol (MIRALAX / GLYCOLAX) 17 g packet Take 17 g by mouth daily.   rosuvastatin (CRESTOR) 5 MG tablet TAKE 1 TABLET BY MOUTH ONCE EVERY EVENING   Sennosides-Docusate Sodium 8.6-50 MG CAPS Take by mouth.   [DISCONTINUED] hydrochlorothiazide (HYDRODIURIL) 12.5  MG tablet Take 1 tablet (12.5 mg total) by mouth daily.   hydrochlorothiazide (HYDRODIURIL) 12.5 MG tablet Take 1/2 tablet by mouth daily   No facility-administered encounter medications on file as of 05/26/2023.    Surgical History: Past Surgical History:  Procedure Laterality Date   BROW LIFT Bilateral 12/13/2019   Procedure: BLEPHAROPLASTY UPPER EYELID; W/EXCESS SKIN BLEPHAROPTOSIS REPAIR; RESECT EX;  Surgeon: Imagene Riches, MD;  Location: [facility];  Service: Ophthalmology;  Laterality: Bilateral;   CATARACT EXTRACTION W/PHACO Right 07/21/2021   Procedure: CATARACT EXTRACTION PHACO AND INTRAOCULAR LENS PLACEMENT (IOC) RIGHT 12.60 01:30.4;  Surgeon: Lockie Mola, MD;  Location: [facility];  Service: Ophthalmology;  Laterality: Right;   CATARACT EXTRACTION W/PHACO Left 08/04/2021   Procedure: CATARACT EXTRACTION PHACO AND INTRAOCULAR LENS PLACEMENT (IOC) LEFT 6.26 01:02.6;  Surgeon: Lockie Mola, MD;  Location: [facility];  Service: Ophthalmology;  Laterality: Left;   COLONOSCOPY WITH PROPOFOL N/A 03/02/2021   Procedure: COLONOSCOPY WITH PROPOFOL;  Surgeon: Wyline Mood, MD;  Location: [facility];  Service: Gastroenterology;  Laterality: N/A;   HIP ARTHROPLASTY Right 04/16/2022   Procedure: ARTHROPLASTY BIPOLAR HIP (HEMIARTHROPLASTY);  Surgeon: Deeann Saint, MD;  Location: [facility];  Service: Orthopedics;  Laterality: Right;   right ankle surgery  1997   THYROIDECTOMY Bilateral 10/20/2020   Procedure: THYROIDECTOMY;  Surgeon: Linus Salmons, MD;  Location: [facility];  Service: ENT;  Laterality: Bilateral;   TUBAL LIGATION  1977    Medical  History: Past Medical History:  Diagnosis Date   Chronic cough    History of COVID-19    04/2021   Hypertension    Hypothyroidism    Mild cardiomegaly    Sinus trouble    Sleep apnea    no CPAP    Family History: Family History  Problem Relation Age of Onset   Hypertension Mother    Diabetes  Mother    Atrial fibrillation Mother    Hypertension Father    Pulmonary fibrosis Father    Breast cancer Sister     Social History   Socioeconomic History   Marital status: Married    Spouse name: Not on file   Number of children: Not on file   Years of education: Not on file   Highest education level: Not on file  Occupational History   Not on file  Tobacco Use   Smoking status: Every Day    Current packs/day: 1.00    Average packs/day: 1 pack/day for 50.0 years (50.0 ttl pk-yrs)    Types: Cigarettes   Smokeless tobacco: Never   Tobacco comments:    1 pack daily  Vaping Use   Vaping status: Never Used  Substance and Sexual Activity   Alcohol use: Yes    Alcohol/week: 7.0 standard drinks of alcohol    Types: 7 Glasses of wine per week    Comment: wine daily   Drug use: Never   Sexual activity: Not on file  Other Topics Concern   Not on file  Social History Narrative   Not on file   Social Drivers of Health   Financial Resource Strain: Not on file  Food Insecurity: No Food Insecurity (04/16/2022)   Hunger Vital Sign    Worried About Running Out of Food in the Last Year: Never true    Ran Out of Food in the Last Year: Never true  Transportation Needs: No Transportation Needs (04/16/2022)   PRAPARE - Administrator, Civil Service (Medical): No    Lack of Transportation (Non-Medical): No  Physical Activity: Not on file  Stress: Not on file  Social Connections: Not on file  Intimate Partner Violence: Not At Risk (04/16/2022)   Humiliation, Afraid, Rape, and Kick questionnaire    Fear of Current or Ex-Partner: No    Emotionally Abused: No    Physically Abused: No    Sexually Abused: No      Review of Systems  Constitutional:  Negative for chills, fatigue and unexpected weight change.  HENT:  Negative for congestion, postnasal drip, rhinorrhea, sneezing and sore throat.   Eyes:  Negative for redness.  Respiratory:  Negative for cough, chest  tightness, shortness of breath and wheezing.   Cardiovascular:  Negative for chest pain and palpitations.  Gastrointestinal:  Negative for abdominal pain, constipation, diarrhea, nausea and vomiting.  Genitourinary:  Negative for dysuria and frequency.  Musculoskeletal:  Negative for arthralgias, back pain, joint swelling and neck pain.  Skin:  Negative for rash.  Neurological: Negative.  Negative for tremors and numbness.  Hematological:  Negative for adenopathy. Does not bruise/bleed easily.  Psychiatric/Behavioral:  Negative for behavioral problems (Depression), sleep disturbance and suicidal ideas. The patient is not nervous/anxious.     Vital Signs: BP 109/70   Pulse 64   Temp 97.8 F (36.6 C)   Resp 16   Ht 5\' 1"  (1.549 m)   Wt 151 lb 9.6 oz (68.8 kg)   SpO2 97%   BMI 28.64 kg/m  Physical Exam Vitals and nursing note reviewed.  Constitutional:      Appearance: Normal appearance.  HENT:     Head: Normocephalic and atraumatic.  Eyes:     Pupils: Pupils are equal, round, and reactive to light.  Cardiovascular:     Rate and Rhythm: Normal rate and regular rhythm.  Pulmonary:     Effort: Pulmonary effort is normal.     Breath sounds: Normal breath sounds. No wheezing.  Musculoskeletal:        General: Normal range of motion.  Skin:    General: Skin is warm and dry.  Neurological:     General: No focal deficit present.     Mental Status: She is alert and oriented to person, place, and time.  Psychiatric:        Mood and Affect: Mood normal.        Behavior: Behavior normal.        Thought Content: Thought content normal.        Judgment: Judgment normal.        Assessment/Plan: 1. Essential hypertension (Primary) Improving, now borderline low in office today, but asymptomatic. May consider dropping 1/2 lisinopril in future if still low, otherwise continue with current regimen (1.5tabs lisinopril in AM, 1 tab lisinopril in evening and 1/2 hydrochlorothiazide  daily)  2. Hyponatremia - Comprehensive metabolic panel  3. Prediabetes - Hgb A1C w/o eAG  4. Other fatigue - Comprehensive metabolic panel - CBC w/Diff/Platelet - Hgb A1C w/o eAG   General Counseling: Timeka verbalizes understanding of the findings of todays visit and agrees with plan of treatment. I have discussed any further diagnostic evaluation that may be needed or ordered today. We also reviewed her medications today. she has been encouraged to call the office with any questions or concerns that should arise related to todays visit.    Orders Placed This Encounter  Procedures   Comprehensive metabolic panel   CBC w/Diff/Platelet   Hgb A1C w/o eAG    Meds ordered this encounter  Medications   hydrochlorothiazide (HYDRODIURIL) 12.5 MG tablet    Sig: Take 1/2 tablet by mouth daily    This patient was seen by Lynn Ito, PA-C in collaboration with Dr. Beverely Risen as a part of collaborative care agreement.   Total time spent:30 Minutes Time spent includes review of chart, medications, test results, and follow up plan with the patient.      Dr Lyndon Code Internal medicine

## 2023-06-07 MED ORDER — HYDROCHLOROTHIAZIDE 12.5 MG PO TABS: ORAL_TABLET | ORAL | Status: DC

## 2023-08-07 ENCOUNTER — Other Ambulatory Visit: Payer: Self-pay | Admitting: Physician Assistant

## 2023-08-07 ENCOUNTER — Other Ambulatory Visit: Payer: Self-pay | Admitting: Nurse Practitioner

## 2023-08-07 DIAGNOSIS — Z760 Encounter for issue of repeat prescription: Secondary | ICD-10-CM

## 2023-08-07 DIAGNOSIS — I10 Essential (primary) hypertension: Secondary | ICD-10-CM

## 2023-09-04 DIAGNOSIS — R5383 Other fatigue: Secondary | ICD-10-CM | POA: Diagnosis not present

## 2023-09-04 DIAGNOSIS — R7303 Prediabetes: Secondary | ICD-10-CM | POA: Diagnosis not present

## 2023-09-04 DIAGNOSIS — E871 Hypo-osmolality and hyponatremia: Secondary | ICD-10-CM | POA: Diagnosis not present

## 2023-09-05 LAB — CBC WITH DIFFERENTIAL/PLATELET
Basophils Absolute: 0.1 10*3/uL (ref 0.0–0.2)
Basos: 1 %
EOS (ABSOLUTE): 0.3 10*3/uL (ref 0.0–0.4)
Eos: 4 %
Hematocrit: 41.4 % (ref 34.0–46.6)
Hemoglobin: 13.7 g/dL (ref 11.1–15.9)
Immature Grans (Abs): 0 10*3/uL (ref 0.0–0.1)
Immature Granulocytes: 0 %
Lymphocytes Absolute: 2.1 10*3/uL (ref 0.7–3.1)
Lymphs: 34 %
MCH: 30 pg (ref 26.6–33.0)
MCHC: 33.1 g/dL (ref 31.5–35.7)
MCV: 91 fL (ref 79–97)
Monocytes Absolute: 0.6 10*3/uL (ref 0.1–0.9)
Monocytes: 9 %
Neutrophils Absolute: 3.2 10*3/uL (ref 1.4–7.0)
Neutrophils: 52 %
Platelets: 275 10*3/uL (ref 150–450)
RBC: 4.57 x10E6/uL (ref 3.77–5.28)
RDW: 13.2 % (ref 11.7–15.4)
WBC: 6.2 10*3/uL (ref 3.4–10.8)

## 2023-09-05 LAB — COMPREHENSIVE METABOLIC PANEL WITH GFR
ALT: 10 IU/L (ref 0–32)
AST: 15 IU/L (ref 0–40)
Albumin: 4.3 g/dL (ref 3.8–4.8)
Alkaline Phosphatase: 63 IU/L (ref 44–121)
BUN/Creatinine Ratio: 13 (ref 12–28)
BUN: 7 mg/dL — ABNORMAL LOW (ref 8–27)
Bilirubin Total: 0.4 mg/dL (ref 0.0–1.2)
CO2: 23 mmol/L (ref 20–29)
Calcium: 9.5 mg/dL (ref 8.7–10.3)
Chloride: 93 mmol/L — ABNORMAL LOW (ref 96–106)
Creatinine, Ser: 0.55 mg/dL — ABNORMAL LOW (ref 0.57–1.00)
Globulin, Total: 1.8 g/dL (ref 1.5–4.5)
Glucose: 111 mg/dL — ABNORMAL HIGH (ref 70–99)
Potassium: 4.6 mmol/L (ref 3.5–5.2)
Sodium: 131 mmol/L — ABNORMAL LOW (ref 134–144)
Total Protein: 6.1 g/dL (ref 6.0–8.5)
eGFR: 94 mL/min/{1.73_m2} (ref >59)

## 2023-09-05 LAB — HGB A1C W/O EAG: Hgb A1c MFr Bld: 6.1 % — ABNORMAL HIGH (ref 4.8–5.6)

## 2023-09-21 ENCOUNTER — Encounter: Payer: Self-pay | Admitting: Physician Assistant

## 2023-09-21 ENCOUNTER — Ambulatory Visit: Payer: Medicare PPO | Admitting: Physician Assistant

## 2023-09-21 VITALS — BP 130/70 | HR 86 | Temp 98.7°F | Resp 16 | Ht 61.0 in | Wt 151.8 lb

## 2023-09-21 DIAGNOSIS — E871 Hypo-osmolality and hyponatremia: Secondary | ICD-10-CM

## 2023-09-21 DIAGNOSIS — I10 Essential (primary) hypertension: Secondary | ICD-10-CM | POA: Diagnosis not present

## 2023-09-21 DIAGNOSIS — R7303 Prediabetes: Secondary | ICD-10-CM | POA: Diagnosis not present

## 2023-09-21 NOTE — Progress Notes (Addendum)
 [facility] [address] [place], [place] [address]  Internal MEDICINE  Office Visit Note  Patient Name: Hailey Wong  [id number]  [id number]  Date of Service: 09/21/2023  Chief Complaint  Patient presents with   Follow-up   Hypertension    HPI Pt is here for routine follow up and is doing well today -BP stable, taking 1.5 tabs lisinopril  and 1/2 hydrochlorothiazide  daily.  -Labs reviewed: A1c up some, still prediabetic.  -sodium and chloride still low, only taking 1/2 hydrochlorothiazide  daily. Has not been doing gatorade as much recently. Discussed stopping hydrochlorothiazide  all together and raising lisinopril  to 2 tabs daily for BP and see if this helps electrolytes -Uses oxygen  at night only and does well with this  Current Medication: Outpatient Encounter Medications as of 09/21/2023  Medication Sig   calcium  carbonate (OS-CAL) 600 MG TABS tablet Take 600 mg by mouth 2 (two) times daily with a meal.   Cholecalciferol  (D3 ADULT PO) Take 1,000 Units by mouth daily.   ferrous sulfate  325 (65 FE) MG tablet Take 1 tablet (325 mg total) by mouth daily with breakfast.   fluticasone  (FLONASE ) 50 MCG/ACT nasal spray Place 1 spray into both nostrils daily as needed for allergies or rhinitis.   hydrochlorothiazide  (HYDRODIURIL ) 12.5 MG tablet TAKE 1 TABLET BY MOUTH ONCE DAILY   HYDROcodone -acetaminophen  (NORCO/VICODIN) 5-325 MG tablet Take 1-2 tablets by mouth every 4 (four) hours as needed for moderate pain (pain score 4-6).   ibandronate  (BONIVA ) 150 MG tablet TAKE 1 TABLET BY MOUTH ONCE MONTHLY. DO NOT LIE DOWN, EAT FOOD, OR TAKE ANY OTHER MEDS FOR LEAST 60 MINUTES.   levothyroxine  (SYNTHROID ) 137 MCG tablet Take 137 mcg by mouth daily before breakfast.   lisinopril  (ZESTRIL ) 10 MG tablet TAKE 1 TABLET BY MOUTH TWICE DAILY   methocarbamol  (ROBAXIN ) 500 MG tablet Take 1 tablet (500 mg total) by mouth every 6 (six) hours as needed for muscle spasms.   polyethylene glycol  (MIRALAX  / GLYCOLAX ) 17 g packet Take 17 g by mouth daily.   rosuvastatin  (CRESTOR ) 5 MG tablet TAKE 1 TABLET BY MOUTH ONCE EVERY EVENING   Sennosides-Docusate Sodium  8.6-50 MG CAPS Take by mouth.   No facility-administered encounter medications on file as of 09/21/2023.    Surgical History: Past Surgical History:  Procedure Laterality Date   BROW LIFT Bilateral 12/13/2019   Procedure: BLEPHAROPLASTY UPPER EYELID; W/EXCESS SKIN BLEPHAROPTOSIS REPAIR; RESECT EX;  Surgeon: Zacarias Hermann, MD;  Location: [facility];  Service: Ophthalmology;  Laterality: Bilateral;   CATARACT EXTRACTION W/PHACO Right 07/21/2021   Procedure: CATARACT EXTRACTION PHACO AND INTRAOCULAR LENS PLACEMENT (IOC) RIGHT 12.60 01:30.4;  Surgeon: Annell Kidney, MD;  Location: [facility];  Service: Ophthalmology;  Laterality: Right;   CATARACT EXTRACTION W/PHACO Left 08/04/2021   Procedure: CATARACT EXTRACTION PHACO AND INTRAOCULAR LENS PLACEMENT (IOC) LEFT 6.26 01:02.6;  Surgeon: Annell Kidney, MD;  Location: [facility];  Service: Ophthalmology;  Laterality: Left;   COLONOSCOPY WITH PROPOFOL  N/A 03/02/2021   Procedure: COLONOSCOPY WITH PROPOFOL ;  Surgeon: Luke Salaam, MD;  Location: [facility];  Service: Gastroenterology;  Laterality: N/A;   HIP ARTHROPLASTY Right 04/16/2022   Procedure: ARTHROPLASTY BIPOLAR HIP (HEMIARTHROPLASTY);  Surgeon: Marlynn Singer, MD;  Location: [facility] OR[facility];  Service: Orthopedics;  Laterality: Right;   right ankle surgery  1997   THYROIDECTOMY Bilateral 10/20/2020   Procedure: THYROIDECTOMY;  Surgeon: Lesly Raspberry, MD;  Location: [facility] ORS;  Service: ENT;  Laterality: Bilateral;   TUBAL LIGATION  1977    Medical History: Past Medical History:  Diagnosis Date   Chronic cough    History of COVID-19    04/2021   Hypertension    Hypothyroidism    Mild cardiomegaly    Sinus trouble    Sleep apnea    no CPAP    Family History: Family History  Problem  Relation Age of Onset   Hypertension Mother    Diabetes Mother    Atrial fibrillation Mother    Hypertension Father    Pulmonary fibrosis Father    Breast cancer Sister     Social History   Socioeconomic History   Marital status: Married    Spouse name: Not on file   Number of children: Not on file   Years of education: Not on file   Highest education level: Not on file  Occupational History   Not on file  Tobacco Use   Smoking status: Every Day    Current packs/day: 1.00    Average packs/day: 1 pack/day for 50.0 years (50.0 ttl pk-yrs)    Types: Cigarettes   Smokeless tobacco: Never   Tobacco comments:    1 pack daily  Vaping Use   Vaping status: Never Used  Substance and Sexual Activity   Alcohol use: Yes    Alcohol/week: 7.0 standard drinks of alcohol    Types: 7 Glasses of wine per week    Comment: wine daily   Drug use: Never   Sexual activity: Not on file  Other Topics Concern   Not on file  Social History Narrative   Not on file   Social Drivers of Health   Financial Resource Strain: Not on file  Food Insecurity: No Food Insecurity (04/16/2022)   Hunger Vital Sign    Worried About Running Out of Food in the Last Year: Never true    Ran Out of Food in the Last Year: Never true  Transportation Needs: No Transportation Needs (04/16/2022)   PRAPARE - Administrator, Civil Service (Medical): No    Lack of Transportation (Non-Medical): No  Physical Activity: Not on file  Stress: Not on file  Social Connections: Not on file  Intimate Partner Violence: Not At Risk (04/16/2022)   Humiliation, Afraid, Rape, and Kick questionnaire    Fear of Current or Ex-Partner: No    Emotionally Abused: No    Physically Abused: No    Sexually Abused: No      Review of Systems  Constitutional:  Negative for chills, fatigue and unexpected weight change.  HENT:  Negative for congestion, postnasal drip, rhinorrhea, sneezing and sore throat.   Eyes:  Negative  for redness.  Respiratory:  Negative for cough, chest tightness, shortness of breath and wheezing.   Cardiovascular:  Negative for chest pain and palpitations.  Gastrointestinal:  Negative for abdominal pain, constipation, diarrhea, nausea and vomiting.  Genitourinary:  Negative for dysuria and frequency.  Musculoskeletal:  Negative for arthralgias, back pain, joint swelling and neck pain.  Skin:  Negative for rash.  Neurological: Negative.  Negative for tremors and numbness.  Hematological:  Negative for adenopathy. Does not bruise/bleed easily.  Psychiatric/Behavioral:  Negative for behavioral problems (Depression), sleep disturbance and suicidal ideas. The patient is not nervous/anxious.     Vital Signs: BP 130/70   Pulse 86   Temp 98.7 F (37.1 C)   Resp 16   Ht 5\' 1"  (1.549 m)   Wt 151 lb 12.8 oz (68.9 kg)   SpO2 98%  BMI 28.68 kg/m    Physical Exam Vitals and nursing note reviewed.  Constitutional:      Appearance: Normal appearance.  HENT:     Head: Normocephalic and atraumatic.  Eyes:     Pupils: Pupils are equal, round, and reactive to light.  Cardiovascular:     Rate and Rhythm: Normal rate and regular rhythm.  Pulmonary:     Effort: Pulmonary effort is normal.     Breath sounds: Normal breath sounds. No wheezing.  Musculoskeletal:        General: Normal range of motion.  Skin:    General: Skin is warm and dry.  Neurological:     General: No focal deficit present.     Mental Status: She is alert and oriented to person, place, and time.  Psychiatric:        Mood and Affect: Mood normal.        Behavior: Behavior normal.        Thought Content: Thought content normal.        Judgment: Judgment normal.        Assessment/Plan: 1. Essential hypertension (Primary) Well controlled, however electrolytes still abnormal and will d/c hydrochlorothiazide  and increase lisinopril  to 20mg  daily and monitor.  2. Hyponatremia Will d/c hydrochlorothiazide  and  recheck labs in a few weeks to see if improving and ensure potassium stable on new regimen - Basic Metabolic Panel (BMET)  3. Prediabetes Continue to work on diet and exercise and will monitor   General Counseling: Joylene verbalizes understanding of the findings of todays visit and agrees with plan of treatment. I have discussed any further diagnostic evaluation that may be needed or ordered today. We also reviewed her medications today. she has been encouraged to call the office with any questions or concerns that should arise related to todays visit.    Orders Placed This Encounter  Procedures   Basic Metabolic Panel (BMET)    No orders of the defined types were placed in this encounter.   This patient was seen by Taylor Favia, PA-C in collaboration with Dr. Verneta Gone as a part of collaborative care agreement.   Total time spent:30 Minutes Time spent includes review of chart, medications, test results, and follow up plan with the patient.      Dr Fozia M Khan Internal medicine

## 2023-09-22 ENCOUNTER — Telehealth: Payer: Self-pay | Admitting: Physician Assistant

## 2023-09-22 NOTE — Telephone Encounter (Addendum)
 Received O2 written order request  from [employer]. Gave to Lauren to complete & sign-Toni

## 2023-09-25 ENCOUNTER — Telehealth: Payer: Self-pay | Admitting: Physician Assistant

## 2023-09-25 NOTE — Telephone Encounter (Signed)
 Written order and 09/21/2023 office notes pertaining to O2 faxed back to [employer]; [phone]

## 2023-10-02 ENCOUNTER — Telehealth: Payer: Self-pay | Admitting: Physician Assistant

## 2023-10-02 NOTE — Telephone Encounter (Addendum)
 Cpap Titration requested by [employer]. I faxed 02/20/2019 Cpap Titration; [phone]

## 2023-10-03 ENCOUNTER — Other Ambulatory Visit: Payer: Self-pay | Admitting: Physician Assistant

## 2023-10-03 DIAGNOSIS — R300 Dysuria: Secondary | ICD-10-CM

## 2023-10-03 DIAGNOSIS — E871 Hypo-osmolality and hyponatremia: Secondary | ICD-10-CM | POA: Diagnosis not present

## 2023-10-03 DIAGNOSIS — E782 Mixed hyperlipidemia: Secondary | ICD-10-CM

## 2023-10-03 DIAGNOSIS — Z1231 Encounter for screening mammogram for malignant neoplasm of breast: Secondary | ICD-10-CM

## 2023-10-03 DIAGNOSIS — M810 Age-related osteoporosis without current pathological fracture: Secondary | ICD-10-CM

## 2023-10-03 DIAGNOSIS — I10 Essential (primary) hypertension: Secondary | ICD-10-CM

## 2023-10-03 DIAGNOSIS — F17210 Nicotine dependence, cigarettes, uncomplicated: Secondary | ICD-10-CM

## 2023-10-03 DIAGNOSIS — Z0001 Encounter for general adult medical examination with abnormal findings: Secondary | ICD-10-CM

## 2023-10-03 DIAGNOSIS — E538 Deficiency of other specified B group vitamins: Secondary | ICD-10-CM

## 2023-10-04 ENCOUNTER — Ambulatory Visit: Payer: Self-pay | Admitting: Physician Assistant

## 2023-10-04 ENCOUNTER — Telehealth: Payer: Self-pay | Admitting: Physician Assistant

## 2023-10-04 ENCOUNTER — Other Ambulatory Visit: Payer: Self-pay | Admitting: Physician Assistant

## 2023-10-04 DIAGNOSIS — G4734 Idiopathic sleep related nonobstructive alveolar hypoventilation: Secondary | ICD-10-CM

## 2023-10-04 LAB — BASIC METABOLIC PANEL WITH GFR
BUN/Creatinine Ratio: 14 (ref 12–28)
BUN: 9 mg/dL (ref 8–27)
CO2: 23 mmol/L (ref 20–29)
Calcium: 9.1 mg/dL (ref 8.7–10.3)
Chloride: 98 mmol/L (ref 96–106)
Creatinine, Ser: 0.63 mg/dL (ref 0.57–1.00)
Glucose: 102 mg/dL — ABNORMAL HIGH (ref 70–99)
Potassium: 4.9 mmol/L (ref 3.5–5.2)
Sodium: 136 mmol/L (ref 134–144)
eGFR: 91 mL/min/{1.73_m2} (ref >59)

## 2023-10-04 NOTE — Telephone Encounter (Signed)
 error

## 2023-10-04 NOTE — Telephone Encounter (Signed)
 Notified Hulon Magic & Devra Fontana with [employer] of ONO order-Toni

## 2023-10-05 NOTE — Addendum Note (Signed)
 Addended by: Kyra Laffey on: 10/05/2023 12:30 PM   Modules accepted: Orders

## 2023-10-13 ENCOUNTER — Telehealth: Payer: Self-pay | Admitting: Physician Assistant

## 2023-10-16 ENCOUNTER — Other Ambulatory Visit: Payer: Self-pay | Admitting: Physician Assistant

## 2023-10-16 ENCOUNTER — Telehealth: Payer: Self-pay

## 2023-10-16 DIAGNOSIS — Z760 Encounter for issue of repeat prescription: Secondary | ICD-10-CM

## 2023-10-16 MED ORDER — LISINOPRIL 10 MG PO TABS: ORAL_TABLET | ORAL | 1 refills | Status: DC

## 2023-10-16 NOTE — Telephone Encounter (Signed)
 Pt notified we sent med

## 2023-10-17 NOTE — Telephone Encounter (Signed)
 error

## 2023-10-23 ENCOUNTER — Encounter: Payer: Self-pay | Admitting: Physician Assistant

## 2023-10-23 ENCOUNTER — Ambulatory Visit: Admitting: Physician Assistant

## 2023-10-23 VITALS — BP 132/78 | HR 77 | Temp 98.3°F | Resp 16 | Ht 61.0 in | Wt 151.2 lb

## 2023-10-23 DIAGNOSIS — E871 Hypo-osmolality and hyponatremia: Secondary | ICD-10-CM

## 2023-10-23 DIAGNOSIS — I10 Essential (primary) hypertension: Secondary | ICD-10-CM

## 2023-10-23 DIAGNOSIS — G4734 Idiopathic sleep related nonobstructive alveolar hypoventilation: Secondary | ICD-10-CM

## 2023-10-23 NOTE — Progress Notes (Signed)
 [facility] [address] [place], [place] [address]  Internal MEDICINE  Office Visit Note  Patient Name: Hailey Wong  [id number]  [id number]  Date of Service: 10/23/2023  Chief Complaint  Patient presents with   Follow-up    Review Labs   Hypertension    HPI -Reports home BP stable at 137/71 since BP med changes -Taking 1.5 tabs lisinopril  BID, stopped hctz -sodium and chloride now back in normal range since stopping hydrochlorothiazide  -tolerating new regimen well and will continue  -will be having overnight pulse ox to re-evaluate nocturnal oxygen , pt denies morning headaches. Explained test should be without her oxygen  to evaluate if need for it. States she was discharged from hospital on O2 a long time ago, but never had any symptoms or knowledge of it being low prior to be told during stay. Doesn't think O2 dropped very low then   Current Medication: Outpatient Encounter Medications as of 10/23/2023  Medication Sig   calcium  carbonate (OS-CAL) 600 MG TABS tablet Take 600 mg by mouth 2 (two) times daily with a meal.   Cholecalciferol  (D3 ADULT PO) Take 1,000 Units by mouth daily.   ferrous sulfate  325 (65 FE) MG tablet Take 1 tablet (325 mg total) by mouth daily with breakfast.   fluticasone  (FLONASE ) 50 MCG/ACT nasal spray Place 1 spray into both nostrils daily as needed for allergies or rhinitis.   HYDROcodone -acetaminophen  (NORCO/VICODIN) 5-325 MG tablet Take 1-2 tablets by mouth every 4 (four) hours as needed for moderate pain (pain score 4-6).   ibandronate  (BONIVA ) 150 MG tablet TAKE 1 TABLET BY MOUTH ONCE MONTHLY. DO NOT LIE DOWN, EAT FOOD, OR TAKE ANY OTHER MEDS FOR LEAST 60 MINUTES.   levothyroxine  (SYNTHROID ) 137 MCG tablet Take 137 mcg by mouth daily before breakfast.   lisinopril  (ZESTRIL ) 10 MG tablet Take 1.5 tabs by mouth twice daily   methocarbamol  (ROBAXIN ) 500 MG tablet Take 1 tablet (500 mg total) by mouth every 6 (six) hours as needed for muscle  spasms.   polyethylene glycol (MIRALAX  / GLYCOLAX ) 17 g packet Take 17 g by mouth daily.   rosuvastatin  (CRESTOR ) 5 MG tablet TAKE 1 TABLET BY MOUTH ONCE EVERY EVENING   Sennosides-Docusate Sodium  8.6-50 MG CAPS Take by mouth.   [DISCONTINUED] hydrochlorothiazide  (HYDRODIURIL ) 12.5 MG tablet TAKE 1 TABLET BY MOUTH ONCE DAILY   No facility-administered encounter medications on file as of 10/23/2023.    Surgical History: Past Surgical History:  Procedure Laterality Date   BROW LIFT Bilateral 12/13/2019   Procedure: BLEPHAROPLASTY UPPER EYELID; W/EXCESS SKIN BLEPHAROPTOSIS REPAIR; RESECT EX;  Surgeon: Ashley Greig HERO, MD;  Location: [facility];  Service: Ophthalmology;  Laterality: Bilateral;   CATARACT EXTRACTION W/PHACO Right 07/21/2021   Procedure: CATARACT EXTRACTION PHACO AND INTRAOCULAR LENS PLACEMENT (IOC) RIGHT 12.60 01:30.4;  Surgeon: Mittie Gaskin, MD;  Location: [facility];  Service: Ophthalmology;  Laterality: Right;   CATARACT EXTRACTION W/PHACO Left 08/04/2021   Procedure: CATARACT EXTRACTION PHACO AND INTRAOCULAR LENS PLACEMENT (IOC) LEFT 6.26 01:02.6;  Surgeon: Mittie Gaskin, MD;  Location: [facility];  Service: Ophthalmology;  Laterality: Left;   COLONOSCOPY WITH PROPOFOL  N/A 03/02/2021   Procedure: COLONOSCOPY WITH PROPOFOL ;  Surgeon: Therisa Bi, MD;  Location: [facility] END[facility];  Service: Gastroenterology;  Laterality: N/A;   HIP ARTHROPLASTY Right 04/16/2022   Procedure: ARTHROPLASTY BIPOLAR HIP (HEMIARTHROPLASTY);  Surgeon: Cleotilde Barrio, MD;  Location: [facility] ORS;  Service: Orthopedics;  Laterality: Right;   right ankle surgery  1997  THYROIDECTOMY Bilateral 10/20/2020   Procedure: THYROIDECTOMY;  Surgeon: Herminio Miu, MD;  Location: [facility] ORS;  Service: ENT;  Laterality: Bilateral;   TUBAL LIGATION  1977    Medical History: Past Medical History:  Diagnosis Date   Chronic cough    History of COVID-19    04/2021   Hypertension     Hypothyroidism    Mild cardiomegaly    Sinus trouble    Sleep apnea    no CPAP    Family History: Family History  Problem Relation Age of Onset   Hypertension Mother    Diabetes Mother    Atrial fibrillation Mother    Hypertension Father    Pulmonary fibrosis Father    Breast cancer Sister     Social History   Socioeconomic History   Marital status: Married    Spouse name: Not on file   Number of children: Not on file   Years of education: Not on file   Highest education level: Not on file  Occupational History   Not on file  Tobacco Use   Smoking status: Every Day    Current packs/day: 1.00    Average packs/day: 1 pack/day for 50.0 years (50.0 ttl pk-yrs)    Types: Cigarettes   Smokeless tobacco: Never   Tobacco comments:    1 pack daily  Vaping Use   Vaping status: Never Used  Substance and Sexual Activity   Alcohol use: Yes    Alcohol/week: 7.0 standard drinks of alcohol    Types: 7 Glasses of wine per week    Comment: wine daily   Drug use: Never   Sexual activity: Not on file  Other Topics Concern   Not on file  Social History Narrative   Not on file   Social Drivers of Health   Financial Resource Strain: Not on file  Food Insecurity: No Food Insecurity (04/16/2022)   Hunger Vital Sign    Worried About Running Out of Food in the Last Year: Never true    Ran Out of Food in the Last Year: Never true  Transportation Needs: No Transportation Needs (04/16/2022)   PRAPARE - Administrator, Civil Service (Medical): No    Lack of Transportation (Non-Medical): No  Physical Activity: Not on file  Stress: Not on file  Social Connections: Not on file  Intimate Partner Violence: Not At Risk (04/16/2022)   Humiliation, Afraid, Rape, and Kick questionnaire    Fear of Current or Ex-Partner: No    Emotionally Abused: No    Physically Abused: No    Sexually Abused: No      Review of Systems  Constitutional:  Negative for chills, fatigue and  unexpected weight change.  HENT:  Negative for congestion, postnasal drip, rhinorrhea, sneezing and sore throat.   Eyes:  Negative for redness.  Respiratory:  Negative for cough, chest tightness, shortness of breath and wheezing.   Cardiovascular:  Negative for chest pain and palpitations.  Gastrointestinal:  Negative for abdominal pain, constipation, diarrhea, nausea and vomiting.  Genitourinary:  Negative for dysuria and frequency.  Musculoskeletal:  Negative for arthralgias, back pain, joint swelling and neck pain.  Skin:  Negative for rash.  Neurological: Negative.  Negative for tremors and numbness.  Hematological:  Negative for adenopathy. Does not bruise/bleed easily.  Psychiatric/Behavioral:  Negative for behavioral problems (Depression), sleep disturbance and suicidal ideas. The patient is not nervous/anxious.     Vital Signs: BP 132/78 Comment: 140/82  Pulse 77   Temp  98.3 F (36.8 C)   Resp 16   Ht 5' 1 (1.549 m)   Wt 151 lb 3.2 oz (68.6 kg)   SpO2 98%   BMI 28.57 kg/m    Physical Exam Vitals and nursing note reviewed.  Constitutional:      Appearance: Normal appearance.  HENT:     Head: Normocephalic and atraumatic.   Eyes:     Pupils: Pupils are equal, round, and reactive to light.    Cardiovascular:     Rate and Rhythm: Normal rate and regular rhythm.  Pulmonary:     Effort: Pulmonary effort is normal.     Breath sounds: Normal breath sounds. No wheezing.   Musculoskeletal:        General: Normal range of motion.   Skin:    General: Skin is warm and dry.   Neurological:     General: No focal deficit present.     Mental Status: She is alert and oriented to person, place, and time.   Psychiatric:        Mood and Affect: Mood normal.        Behavior: Behavior normal.        Thought Content: Thought content normal.        Judgment: Judgment normal.        Assessment/Plan: 1. Essential hypertension (Primary) Stable, continue  lisinopril   2. Hyponatremia Resolved since stopping hctz  3. Nocturnal hypoxia Will be having overnight pulse ox to re-evaluate if continued need for oxygen    General Counseling: Verla verbalizes understanding of the findings of todays visit and agrees with plan of treatment. I have discussed any further diagnostic evaluation that may be needed or ordered today. We also reviewed her medications today. she has been encouraged to call the office with any questions or concerns that should arise related to todays visit.    No orders of the defined types were placed in this encounter.   No orders of the defined types were placed in this encounter.   This patient was seen by Tinnie Pro, PA-C in collaboration with Dr. Sigrid Bathe as a part of collaborative care agreement.   Total time spent:30 Minutes Time spent includes review of chart, medications, test results, and follow up plan with the patient.      Dr Fozia M Khan Internal medicine

## 2023-10-25 ENCOUNTER — Telehealth: Payer: Self-pay | Admitting: Physician Assistant

## 2023-10-25 NOTE — Telephone Encounter (Signed)
 Per Avelina w/ [employer]>> pt wants to Pick up ONO after her vacation 10/31/2023.

## 2023-11-07 DIAGNOSIS — E890 Postprocedural hypothyroidism: Secondary | ICD-10-CM | POA: Diagnosis not present

## 2023-11-07 DIAGNOSIS — Z85850 Personal history of malignant neoplasm of thyroid: Secondary | ICD-10-CM | POA: Diagnosis not present

## 2023-11-14 ENCOUNTER — Telehealth: Payer: Self-pay

## 2023-11-14 DIAGNOSIS — E890 Postprocedural hypothyroidism: Secondary | ICD-10-CM | POA: Diagnosis not present

## 2023-11-14 DIAGNOSIS — Z85850 Personal history of malignant neoplasm of thyroid: Secondary | ICD-10-CM | POA: Diagnosis not present

## 2023-11-14 DIAGNOSIS — F17210 Nicotine dependence, cigarettes, uncomplicated: Secondary | ICD-10-CM | POA: Diagnosis not present

## 2023-11-14 DIAGNOSIS — Z1331 Encounter for screening for depression: Secondary | ICD-10-CM | POA: Diagnosis not present

## 2023-11-14 NOTE — Progress Notes (Signed)
   11/14/2023  Patient ID: Dickey GORMAN Balloon, female   DOB: 1945/06/03, 78 y.o.   MRN: [id number]   This patient is appearing on a report for being at risk of failing the adherence measure for identified medications this calendar year.   Medication Adherence Summary (STAR/HEDIS Monitoring): Adherence Category: hypertension (ACEi/ARB)    Drug Name: Lisinopril  10 mg  Last Kandra or Sold Date: 10/16/2023 Days' Supply: 90   Notes: ? Adherence data pulled from pharmacy claims portal Dr. Annemarie. ? Reviewed barriers to adherence: N/A. ? Plan: Follow up on next fill in September  Dorcas Solian, PharmD Clinical Pharmacist Cell: [phone]

## 2023-12-26 ENCOUNTER — Other Ambulatory Visit: Payer: Self-pay | Admitting: Physician Assistant

## 2023-12-26 DIAGNOSIS — I6523 Occlusion and stenosis of bilateral carotid arteries: Secondary | ICD-10-CM

## 2023-12-26 DIAGNOSIS — I700 Atherosclerosis of aorta: Secondary | ICD-10-CM

## 2024-01-11 DIAGNOSIS — Z961 Presence of intraocular lens: Secondary | ICD-10-CM | POA: Diagnosis not present

## 2024-01-11 DIAGNOSIS — H26493 Other secondary cataract, bilateral: Secondary | ICD-10-CM | POA: Diagnosis not present

## 2024-01-11 DIAGNOSIS — Z0100 Encounter for examination of eyes and vision without abnormal findings: Secondary | ICD-10-CM | POA: Diagnosis not present

## 2024-01-11 DIAGNOSIS — H02834 Dermatochalasis of left upper eyelid: Secondary | ICD-10-CM | POA: Diagnosis not present

## 2024-01-11 DIAGNOSIS — H43813 Vitreous degeneration, bilateral: Secondary | ICD-10-CM | POA: Diagnosis not present

## 2024-01-25 ENCOUNTER — Telehealth: Payer: Self-pay | Admitting: Physician Assistant

## 2024-01-25 NOTE — Telephone Encounter (Signed)
 Left vm and sent mychart message to confirm 02/01/24 appointment-Toni

## 2024-02-01 ENCOUNTER — Encounter: Payer: Self-pay | Admitting: Physician Assistant

## 2024-02-01 ENCOUNTER — Ambulatory Visit: Payer: Medicare PPO | Admitting: Physician Assistant

## 2024-02-01 ENCOUNTER — Telehealth: Payer: Self-pay | Admitting: Physician Assistant

## 2024-02-01 VITALS — BP 120/63 | HR 69 | Temp 98.2°F | Resp 16 | Ht 61.0 in | Wt 147.0 lb

## 2024-02-01 DIAGNOSIS — Z0000 Encounter for general adult medical examination without abnormal findings: Secondary | ICD-10-CM | POA: Diagnosis not present

## 2024-02-01 DIAGNOSIS — I10 Essential (primary) hypertension: Secondary | ICD-10-CM | POA: Diagnosis not present

## 2024-02-01 DIAGNOSIS — Z1231 Encounter for screening mammogram for malignant neoplasm of breast: Secondary | ICD-10-CM

## 2024-02-01 DIAGNOSIS — M810 Age-related osteoporosis without current pathological fracture: Secondary | ICD-10-CM

## 2024-02-01 DIAGNOSIS — F17210 Nicotine dependence, cigarettes, uncomplicated: Secondary | ICD-10-CM | POA: Diagnosis not present

## 2024-02-01 DIAGNOSIS — J439 Emphysema, unspecified: Secondary | ICD-10-CM

## 2024-02-01 DIAGNOSIS — G4734 Idiopathic sleep related nonobstructive alveolar hypoventilation: Secondary | ICD-10-CM

## 2024-02-01 NOTE — Telephone Encounter (Signed)
 Lvm notifying patient of [facility] appointment date, arrival time-Toni

## 2024-02-01 NOTE — Telephone Encounter (Signed)
 Notified Hulon Magic & Devra Fontana with [employer] of ONO order-Toni

## 2024-02-01 NOTE — Progress Notes (Signed)
 [facility] [address] [place], [place] [address]  Internal MEDICINE  Office Visit Note  Patient Name: Hailey Wong  [id number]  [id number]  Date of Service: 02/01/2024  Chief Complaint  Patient presents with   Medicare Wellness   Hypertension   Medication Refill    Crestor     HPI Hailey Wong presents for an annual well visit Well-appearing 78 y.o.female Routine mammogram: due in Dec DEXA scan: Due and will order, has been on boniva  Other concerns: -has been on oxygen  since dec 2023 after leaving hospital, now insurance is questioning this and needs testing to confirm need. Received bill from [facility] and is going there today to address this. Will start with ONO test on room air, may ultimately need sleep study though -sleeps better in recliner and may start sleeping in this position all the time -meds reviewed -BP well controlled -due for annual CT lung screening, unfortunately continues to smoke.      01/04/2021   10:51 AM 11/26/2019   10:14 AM 11/06/2018    9:25 AM  MMSE - Mini Mental State Exam  Orientation to time 5 5 5   Orientation to Place 5 5 5   Registration 3 3 3   Attention/ Calculation 5 5 5   Recall 3 3 3   Language- name 2 objects 2 2 2   Language- repeat 1 1 1   Language- follow 3 step command 3 3 3   Language- read & follow direction 1 1 1   Write a sentence 1 1 1   Copy design 1 1 1   Total score 30 30 30     Functional Status Survey:       09/29/2022   10:43 AM 01/26/2023    8:55 AM 04/06/2023   10:33 AM 09/21/2023   10:18 AM 02/01/2024    9:11 AM  Fall Risk  Falls in the past year? 0 1 0 0 0  Was there an injury with Fall?  1     Was there an injury with Fall? - Comments  Broken hip     Fall Risk Category Calculator  2          02/01/2024    9:11 AM  Depression screen PHQ 2/9  Decreased Interest 0  Down, Depressed, Hopeless 0  PHQ - 2 Score 0        No data to display            Current Medication: Outpatient Encounter  Medications as of 02/01/2024  Medication Sig   aspirin  EC 81 MG tablet Take 81 mg by mouth 3 (three) times a week. Swallow whole.   calcium  carbonate (OS-CAL) 600 MG TABS tablet Take 600 mg by mouth 2 (two) times daily with a meal.   Cholecalciferol  (D3 ADULT PO) Take 1,000 Units by mouth daily.   fluticasone  (FLONASE ) 50 MCG/ACT nasal spray Place 1 spray into both nostrils daily as needed for allergies or rhinitis.   ibandronate  (BONIVA ) 150 MG tablet TAKE 1 TABLET BY MOUTH ONCE MONTHLY. DO NOT LIE DOWN, EAT FOOD, OR TAKE ANY OTHER MEDS FOR LEAST 60 MINUTES.   levothyroxine  (SYNTHROID ) 137 MCG tablet Take 137 mcg by mouth daily before breakfast.   lisinopril  (ZESTRIL ) 10 MG tablet Take 1.5 tabs by mouth twice daily   polyethylene glycol (MIRALAX  / GLYCOLAX ) 17 g packet Take 17 g by mouth daily.   rosuvastatin  (CRESTOR ) 5 MG tablet TAKE 1 TABLET BY MOUTH ONCE EVERY EVENING   Sennosides-Docusate Sodium  8.6-50 MG CAPS Take by mouth.   [  DISCONTINUED] ferrous sulfate  325 (65 FE) MG tablet Take 1 tablet (325 mg total) by mouth daily with breakfast.   [DISCONTINUED] HYDROcodone -acetaminophen  (NORCO/VICODIN) 5-325 MG tablet Take 1-2 tablets by mouth every 4 (four) hours as needed for moderate pain (pain score 4-6).   [DISCONTINUED] methocarbamol  (ROBAXIN ) 500 MG tablet Take 1 tablet (500 mg total) by mouth every 6 (six) hours as needed for muscle spasms.   No facility-administered encounter medications on file as of 02/01/2024.    Surgical History: Past Surgical History:  Procedure Laterality Date   BROW LIFT Bilateral 12/13/2019   Procedure: BLEPHAROPLASTY UPPER EYELID; W/EXCESS SKIN BLEPHAROPTOSIS REPAIR; RESECT EX;  Surgeon: Ashley Greig HERO, MD;  Location: [facility];  Service: Ophthalmology;  Laterality: Bilateral;   CATARACT EXTRACTION W/PHACO Right 07/21/2021   Procedure: CATARACT EXTRACTION PHACO AND INTRAOCULAR LENS PLACEMENT (IOC) RIGHT 12.60 01:30.4;  Surgeon: Mittie Gaskin, MD;   Location: [facility];  Service: Ophthalmology;  Laterality: Right;   CATARACT EXTRACTION W/PHACO Left 08/04/2021   Procedure: CATARACT EXTRACTION PHACO AND INTRAOCULAR LENS PLACEMENT (IOC) LEFT 6.26 01:02.6;  Surgeon: Mittie Gaskin, MD;  Location: [facility];  Service: Ophthalmology;  Laterality: Left;   COLONOSCOPY WITH PROPOFOL  N/A 03/02/2021   Procedure: COLONOSCOPY WITH PROPOFOL ;  Surgeon: Therisa Bi, MD;  Location: [facility];  Service: Gastroenterology;  Laterality: N/A;   HIP ARTHROPLASTY Right 04/16/2022   Procedure: ARTHROPLASTY BIPOLAR HIP (HEMIARTHROPLASTY);  Surgeon: Cleotilde Barrio, MD;  Location: [facility];  Service: Orthopedics;  Laterality: Right;   right ankle surgery  1997   THYROIDECTOMY Bilateral 10/20/2020   Procedure: THYROIDECTOMY;  Surgeon: Herminio Miu, MD;  Location: [facility];  Service: ENT;  Laterality: Bilateral;   TUBAL LIGATION  1977    Medical History: Past Medical History:  Diagnosis Date   Chronic cough    History of COVID-19    04/2021   Hypertension    Hypothyroidism    Mild cardiomegaly    Sinus trouble    Sleep apnea    no CPAP    Family History: Family History  Problem Relation Age of Onset   Hypertension Mother    Diabetes Mother    Atrial fibrillation Mother    Hypertension Father    Pulmonary fibrosis Father    Breast cancer Sister     Social History   Socioeconomic History   Marital status: Married    Spouse name: Not on file   Number of children: Not on file   Years of education: Not on file   Highest education level: Not on file  Occupational History   Not on file  Tobacco Use   Smoking status: Every Day    Current packs/day: 1.00    Average packs/day: 1 pack/day for 50.0 years (50.0 ttl pk-yrs)    Types: Cigarettes   Smokeless tobacco: Never   Tobacco comments:    1 pack daily  Vaping Use   Vaping status: Never Used  Substance and Sexual Activity   Alcohol use: Yes    Alcohol/week:  7.0 standard drinks of alcohol    Types: 7 Glasses of wine per week    Comment: wine daily   Drug use: Never   Sexual activity: Not on file  Other Topics Concern   Not on file  Social History Narrative   Not on file   Social Drivers of Health   Financial Resource Strain: Not on file  Food Insecurity: No Food Insecurity (04/16/2022)   Hunger Vital Sign    Worried  About Running Out of Food in the Last Year: Never true    Ran Out of Food in the Last Year: Never true  Transportation Needs: No Transportation Needs (04/16/2022)   PRAPARE - Administrator, Civil Service (Medical): No    Lack of Transportation (Non-Medical): No  Physical Activity: Not on file  Stress: Not on file  Social Connections: Not on file  Intimate Partner Violence: Not At Risk (04/16/2022)   Humiliation, Afraid, Rape, and Kick questionnaire    Fear of Current or Ex-Partner: No    Emotionally Abused: No    Physically Abused: No    Sexually Abused: No      Review of Systems  Constitutional:  Negative for chills, fatigue and unexpected weight change.  HENT:  Negative for congestion, postnasal drip, rhinorrhea, sneezing and sore throat.   Eyes:  Negative for redness.  Respiratory:  Negative for cough, chest tightness, shortness of breath and wheezing.   Cardiovascular:  Negative for chest pain and palpitations.  Gastrointestinal:  Negative for abdominal pain, constipation, diarrhea, nausea and vomiting.  Genitourinary:  Negative for dysuria and frequency.  Musculoskeletal:  Negative for arthralgias, back pain, joint swelling and neck pain.  Skin:  Negative for rash.  Neurological: Negative.  Negative for tremors and numbness.  Hematological:  Negative for adenopathy. Does not bruise/bleed easily.  Psychiatric/Behavioral:  Negative for behavioral problems (Depression), sleep disturbance and suicidal ideas. The patient is not nervous/anxious.     Vital Signs: BP 120/63   Pulse 69   Temp 98.2  F (36.8 C)   Resp 16   Ht 5' 1 (1.549 m)   Wt 147 lb (66.7 kg)   SpO2 97%   BMI 27.78 kg/m    Physical Exam Vitals and nursing note reviewed.  Constitutional:      Appearance: Normal appearance.  HENT:     Head: Normocephalic and atraumatic.  Eyes:     Extraocular Movements: Extraocular movements intact.  Cardiovascular:     Rate and Rhythm: Normal rate and regular rhythm.  Pulmonary:     Effort: Pulmonary effort is normal.     Breath sounds: Normal breath sounds. No wheezing.  Skin:    General: Skin is warm and dry.  Neurological:     General: No focal deficit present.     Mental Status: She is alert and oriented to person, place, and time.  Psychiatric:        Mood and Affect: Mood normal.        Behavior: Behavior normal.        Thought Content: Thought content normal.        Judgment: Judgment normal.        Assessment/Plan: 1. Encounter for annual wellness exam in Medicare patient (Primary) AWV performed, due for mammogram and bone density in Dec. Lung screening also ordered  2. Essential hypertension Well controlled, continue current medication 3. Age-related osteoporosis without current pathological fracture - DG Bone Density; Future  4. Visit for screening mammogram - MM 3D SCREENING MAMMOGRAM BILATERAL BREAST; Future  5. Cigarette nicotine dependence without complication - CT CHEST LUNG CA SCREEN LOW DOSE W/O CM; Future  6. Emphysema, unspecified (HCC) - CT CHEST LUNG CA SCREEN LOW DOSE W/O CM; Future  7. Nocturnal hypoxemia Will start with ONO to re-evaluate, may ultimately require sleep study - Pulse oximetry, overnight; Future     General Counseling: Jazmen verbalizes understanding of the findings of todays visit and agrees with plan of treatment.  I have discussed any further diagnostic evaluation that may be needed or ordered today. We also reviewed her medications today. she has been encouraged to call the office with any questions or  concerns that should arise related to todays visit.    Orders Placed This Encounter  Procedures   DG Bone Density   MM 3D SCREENING MAMMOGRAM BILATERAL BREAST   CT CHEST LUNG CA SCREEN LOW DOSE W/O CM   Pulse oximetry, overnight    No orders of the defined types were placed in this encounter.   Return for after test is done.   Total time spent:45 Minutes Time spent includes review of chart, medications, test results, and follow up plan with the patient.   [place] Controlled Substance Database was reviewed by me.  This patient was seen by Tinnie Pro, PA-C in collaboration with Dr. Sigrid Bathe as a part of collaborative care agreement.  Tinnie Pro, PA-C Internal medicine

## 2024-02-13 DIAGNOSIS — H26491 Other secondary cataract, right eye: Secondary | ICD-10-CM | POA: Diagnosis not present

## 2024-02-15 ENCOUNTER — Other Ambulatory Visit: Payer: Self-pay | Admitting: Physician Assistant

## 2024-02-15 DIAGNOSIS — F17210 Nicotine dependence, cigarettes, uncomplicated: Secondary | ICD-10-CM

## 2024-02-15 DIAGNOSIS — R911 Solitary pulmonary nodule: Secondary | ICD-10-CM

## 2024-02-15 DIAGNOSIS — J439 Emphysema, unspecified: Secondary | ICD-10-CM

## 2024-02-16 ENCOUNTER — Encounter: Payer: Self-pay | Admitting: Physician Assistant

## 2024-02-16 ENCOUNTER — Telehealth: Payer: Self-pay | Admitting: Physician Assistant

## 2024-02-16 NOTE — Telephone Encounter (Signed)
 Per [employer], ONO was shipped to patient yesterday-Toni

## 2024-02-21 ENCOUNTER — Ambulatory Visit
Admission: RE | Admit: 2024-02-21 | Discharge: 2024-02-21 | Disposition: A | Discharge status: Home / Self Care | Source: Ambulatory Visit | Attending: Physician Assistant | Admitting: Physician Assistant

## 2024-02-21 DIAGNOSIS — R911 Solitary pulmonary nodule: Secondary | ICD-10-CM

## 2024-02-21 DIAGNOSIS — R918 Other nonspecific abnormal finding of lung field: Secondary | ICD-10-CM | POA: Diagnosis not present

## 2024-02-21 DIAGNOSIS — F17210 Nicotine dependence, cigarettes, uncomplicated: Secondary | ICD-10-CM

## 2024-02-21 DIAGNOSIS — J439 Emphysema, unspecified: Secondary | ICD-10-CM

## 2024-02-22 DIAGNOSIS — R0902 Hypoxemia: Secondary | ICD-10-CM | POA: Diagnosis not present

## 2024-02-22 DIAGNOSIS — G4730 Sleep apnea, unspecified: Secondary | ICD-10-CM | POA: Diagnosis not present

## 2024-03-06 ENCOUNTER — Telehealth: Payer: Self-pay | Admitting: Physician Assistant

## 2024-03-06 NOTE — Telephone Encounter (Signed)
 Received ONO results. Gave to [employer]

## 2024-03-18 ENCOUNTER — Ambulatory Visit: Admitting: Physician Assistant

## 2024-03-25 ENCOUNTER — Encounter: Payer: Self-pay | Admitting: Physician Assistant

## 2024-03-25 ENCOUNTER — Telehealth: Payer: Self-pay | Admitting: Physician Assistant

## 2024-03-25 ENCOUNTER — Ambulatory Visit: Admitting: Physician Assistant

## 2024-03-25 VITALS — BP 138/65 | HR 69 | Temp 98.0°F | Resp 16 | Ht 61.0 in | Wt 145.0 lb

## 2024-03-25 DIAGNOSIS — I517 Cardiomegaly: Secondary | ICD-10-CM

## 2024-03-25 DIAGNOSIS — I700 Atherosclerosis of aorta: Secondary | ICD-10-CM | POA: Diagnosis not present

## 2024-03-25 DIAGNOSIS — I77810 Thoracic aortic ectasia: Secondary | ICD-10-CM

## 2024-03-25 DIAGNOSIS — R0602 Shortness of breath: Secondary | ICD-10-CM | POA: Diagnosis not present

## 2024-03-25 DIAGNOSIS — G4733 Obstructive sleep apnea (adult) (pediatric): Secondary | ICD-10-CM | POA: Diagnosis not present

## 2024-03-25 DIAGNOSIS — R911 Solitary pulmonary nodule: Secondary | ICD-10-CM | POA: Diagnosis not present

## 2024-03-25 DIAGNOSIS — G4734 Idiopathic sleep related nonobstructive alveolar hypoventilation: Secondary | ICD-10-CM

## 2024-03-25 DIAGNOSIS — J439 Emphysema, unspecified: Secondary | ICD-10-CM

## 2024-03-25 NOTE — Progress Notes (Signed)
 [facility] [address] [place], [place] [address]  Internal MEDICINE  Office Visit Note  Patient Name: Hailey Wong  [id number]  [id number]  Date of Service: 03/25/2024  Chief Complaint  Patient presents with   Follow-up    Review CT and ONO    Hypertension    HPI Pt is here for routine follow up to review test results --CT chest: IMPRESSION:  1. Occasional tiny pulmonary nodules unchanged, benign benign and  requiring no specific follow-up. Ongoing low-dose CT lung cancer  screening if indicated by patient age, smoking history, and/or other  risk factors for lung cancer  2. Emphysema.  3. Cardiomegaly.  Aortic Atherosclerosis (ICD10-I70.0) and Emphysema (ICD10-J43.9).   --ONO: O2 <88% for 1hr ; deset events 315, ODI 53.22 -still has her oxygen  currently, but insurance wont continue to cover in future without proof of desats on OSA treatment. Because hx of OSA in past will need to retest and have titration to prove oxygen  need or not on CPAP if positive for OSA again -patient has history of OSA, but is not on CPAP. She does have snoring and fatigue, but no gasping. Does wake to use restroom occasionally.  -patient admits rare SOB with exertion, but normally does well during daytime. Will go ahead and check 6 min walk  Current Medication: Outpatient Encounter Medications as of 03/25/2024  Medication Sig   aspirin  EC 81 MG tablet Take 81 mg by mouth 3 (three) times a week. Swallow whole.   Berberine Chloride (BERBERINE HCI PO) Take by mouth.   calcium  carbonate (OS-CAL) 600 MG TABS tablet Take 600 mg by mouth 2 (two) times daily with a meal.   Cholecalciferol  (D3 ADULT PO) Take 1,000 Units by mouth daily.   Elderberry-Vitamin C-Zinc  (ELDERBERRY IMMUNE HEALTH GUMMY PO) Take by mouth.   fluticasone  (FLONASE ) 50 MCG/ACT nasal spray Place 1 spray into both nostrils daily as needed for allergies or rhinitis.   ibandronate  (BONIVA ) 150 MG tablet TAKE 1 TABLET BY  MOUTH ONCE MONTHLY. DO NOT LIE DOWN, EAT FOOD, OR TAKE ANY OTHER MEDS FOR LEAST 60 MINUTES.   levothyroxine  (SYNTHROID ) 137 MCG tablet Take 137 mcg by mouth daily before breakfast.   lisinopril  (ZESTRIL ) 10 MG tablet Take 1.5 tabs by mouth twice daily   polyethylene glycol (MIRALAX  / GLYCOLAX ) 17 g packet Take 17 g by mouth daily.   rosuvastatin  (CRESTOR ) 5 MG tablet TAKE 1 TABLET BY MOUTH ONCE EVERY EVENING   Sennosides-Docusate Sodium  8.6-50 MG CAPS Take by mouth.   vitamin B-12 (CYANOCOBALAMIN ) 100 MCG tablet Take 100 mcg by mouth daily.   No facility-administered encounter medications on file as of 03/25/2024.    Surgical History: Past Surgical History:  Procedure Laterality Date   BROW LIFT Bilateral 12/13/2019   Procedure: BLEPHAROPLASTY UPPER EYELID; W/EXCESS SKIN BLEPHAROPTOSIS REPAIR; RESECT EX;  Surgeon: Ashley Greig HERO, MD;  Location: [facility];  Service: Ophthalmology;  Laterality: Bilateral;   CATARACT EXTRACTION W/PHACO Right 07/21/2021   Procedure: CATARACT EXTRACTION PHACO AND INTRAOCULAR LENS PLACEMENT (IOC) RIGHT 12.60 01:30.4;  Surgeon: Mittie Gaskin, MD;  Location: [facility];  Service: Ophthalmology;  Laterality: Right;   CATARACT EXTRACTION W/PHACO Left 08/04/2021   Procedure: CATARACT EXTRACTION PHACO AND INTRAOCULAR LENS PLACEMENT (IOC) LEFT 6.26 01:02.6;  Surgeon: Mittie Gaskin, MD;  Location: [facility];  Service: Ophthalmology;  Laterality: Left;   COLONOSCOPY WITH PROPOFOL  N/A 03/02/2021   Procedure: COLONOSCOPY WITH PROPOFOL ;  Surgeon: Therisa Bi, MD;  Location: [facility]  ENDOSCOPY;  Service: Gastroenterology;  Laterality: N/A;   HIP ARTHROPLASTY Right 04/16/2022   Procedure: ARTHROPLASTY BIPOLAR HIP (HEMIARTHROPLASTY);  Surgeon: Cleotilde Barrio, MD;  Location: [facility] OR[facility];  Service: Orthopedics;  Laterality: Right;   right ankle surgery  1997   THYROIDECTOMY Bilateral 10/20/2020   Procedure: THYROIDECTOMY;  Surgeon: Herminio Miu,  MD;  Location: [facility] ORS;  Service: ENT;  Laterality: Bilateral;   TUBAL LIGATION  1977    Medical History: Past Medical History:  Diagnosis Date   Chronic cough    History of COVID-19    04/2021   Hypertension    Hypothyroidism    Mild cardiomegaly    Sinus trouble    Sleep apnea    no CPAP    Family History: Family History  Problem Relation Age of Onset   Hypertension Mother    Diabetes Mother    Atrial fibrillation Mother    Hypertension Father    Pulmonary fibrosis Father    Breast cancer Sister     Social History   Socioeconomic History   Marital status: Married    Spouse name: Not on file   Number of children: Not on file   Years of education: Not on file   Highest education level: Not on file  Occupational History   Not on file  Tobacco Use   Smoking status: Every Day    Current packs/day: 1.00    Average packs/day: 1 pack/day for 50.0 years (50.0 ttl pk-yrs)    Types: Cigarettes   Smokeless tobacco: Never   Tobacco comments:    1 pack daily  Vaping Use   Vaping status: Never Used  Substance and Sexual Activity   Alcohol use: Yes    Alcohol/week: 7.0 standard drinks of alcohol    Types: 7 Glasses of wine per week    Comment: wine daily   Drug use: Never   Sexual activity: Not on file  Other Topics Concern   Not on file  Social History Narrative   Not on file   Social Drivers of Health   Financial Resource Strain: Not on file  Food Insecurity: No Food Insecurity (04/16/2022)   Hunger Vital Sign    Worried About Running Out of Food in the Last Year: Never true    Ran Out of Food in the Last Year: Never true  Transportation Needs: No Transportation Needs (04/16/2022)   PRAPARE - Administrator, Civil Service (Medical): No    Lack of Transportation (Non-Medical): No  Physical Activity: Not on file  Stress: Not on file  Social Connections: Not on file  Intimate Partner Violence: Not At Risk (04/16/2022)   Humiliation, Afraid,  Rape, and Kick questionnaire    Fear of Current or Ex-Partner: No    Emotionally Abused: No    Physically Abused: No    Sexually Abused: No      Review of Systems  Constitutional:  Negative for chills, fatigue and unexpected weight change.  HENT:  Negative for congestion, postnasal drip, rhinorrhea, sneezing and sore throat.   Eyes:  Negative for redness.  Respiratory:  Negative for cough, chest tightness, shortness of breath and wheezing.   Cardiovascular:  Negative for chest pain and palpitations.  Gastrointestinal:  Negative for abdominal pain, constipation, diarrhea, nausea and vomiting.  Genitourinary:  Negative for dysuria and frequency.  Musculoskeletal:  Negative for arthralgias, back pain, joint swelling and neck pain.  Skin:  Negative for rash.  Neurological: Negative.  Negative for tremors and numbness.  Hematological:  Negative for adenopathy. Does not bruise/bleed easily.  Psychiatric/Behavioral:  Negative for behavioral problems (Depression), sleep disturbance and suicidal ideas. The patient is not nervous/anxious.     Vital Signs: BP 138/65   Pulse 69   Temp 98 F (36.7 C)   Resp 16   Ht 5' 1 (1.549 m)   Wt 145 lb (65.8 kg)   SpO2 96%   BMI 27.40 kg/m    Physical Exam Vitals and nursing note reviewed.  Constitutional:      Appearance: Normal appearance.  HENT:     Head: Normocephalic and atraumatic.  Eyes:     Extraocular Movements: Extraocular movements intact.  Cardiovascular:     Rate and Rhythm: Normal rate and regular rhythm.  Pulmonary:     Effort: Pulmonary effort is normal.     Breath sounds: Normal breath sounds. No wheezing.  Skin:    General: Skin is warm and dry.  Neurological:     General: No focal deficit present.     Mental Status: She is alert and oriented to person, place, and time.  Psychiatric:        Mood and Affect: Mood normal.        Behavior: Behavior normal.        Thought Content: Thought content normal.         Judgment: Judgment normal.        Assessment/Plan: 1. OSA (obstructive sleep apnea) (Primary) Due to hx of OSA with nocturnal hypoxemia, snoring, and fatigue, will order updated testing. Will need titration if positive for OSA to determine oxygen  needs. - PSG SLEEP STUDY; Future  2. Nocturnal hypoxemia Will order PSG - PSG SLEEP STUDY; Future  3. Emphysema, unspecified (HCC) CT stable and pt reports breathing stable. Will need updated PFT/pulmonary visit in future  4. Cardiomegaly Seen on CT and may correlate with previous echo findings of LVH and aortic root dilatation which are likely related to untreated OSA. May consider updated echo in future. BP controlled.  5. Aortic root dilatation Will move forward with OSA retesting and treatment  6. Aortic atherosclerosis Continue crestor   7. Pulmonary nodule Unchanged on CT  8. SOB (shortness of breath) - 6 minute walk   General Counseling: Maanasa verbalizes understanding of the findings of todays visit and agrees with plan of treatment. I have discussed any further diagnostic evaluation that may be needed or ordered today. We also reviewed her medications today. she has been encouraged to call the office with any questions or concerns that should arise related to todays visit.    Orders Placed This Encounter  Procedures   6 minute walk   PSG SLEEP STUDY    No orders of the defined types were placed in this encounter.   This patient was seen by Tinnie Pro, PA-C in collaboration with Dr. Sigrid Bathe as a part of collaborative care agreement.   Total time spent:40 Minutes Time spent includes review of chart, medications, test results, and follow up plan with the patient.      Dr Fozia M Khan Internal medicine

## 2024-03-25 NOTE — Telephone Encounter (Signed)
 SS order emailed to [facility]

## 2024-04-02 ENCOUNTER — Encounter

## 2024-04-02 ENCOUNTER — Other Ambulatory Visit

## 2024-04-15 ENCOUNTER — Other Ambulatory Visit: Payer: Self-pay | Admitting: Physician Assistant

## 2024-04-15 DIAGNOSIS — Z760 Encounter for issue of repeat prescription: Secondary | ICD-10-CM

## 2024-04-24 ENCOUNTER — Ambulatory Visit
Admission: RE | Admit: 2024-04-24 | Discharge: 2024-04-24 | Disposition: A | Discharge status: Home / Self Care | Source: Ambulatory Visit | Attending: Physician Assistant | Admitting: Physician Assistant

## 2024-04-24 DIAGNOSIS — M810 Age-related osteoporosis without current pathological fracture: Secondary | ICD-10-CM | POA: Diagnosis present

## 2024-04-24 DIAGNOSIS — Z1231 Encounter for screening mammogram for malignant neoplasm of breast: Secondary | ICD-10-CM | POA: Insufficient documentation

## 2024-04-29 ENCOUNTER — Other Ambulatory Visit: Payer: Self-pay | Admitting: Physician Assistant

## 2024-04-29 DIAGNOSIS — R928 Other abnormal and inconclusive findings on diagnostic imaging of breast: Secondary | ICD-10-CM

## 2024-04-30 ENCOUNTER — Encounter: Payer: Self-pay | Admitting: Internal Medicine

## 2024-04-30 ENCOUNTER — Telehealth: Payer: Self-pay | Admitting: Physician Assistant

## 2024-04-30 DIAGNOSIS — G4733 Obstructive sleep apnea (adult) (pediatric): Secondary | ICD-10-CM | POA: Diagnosis not present

## 2024-04-30 NOTE — Telephone Encounter (Signed)
 03/25/24 Office notes pertaining to O2 faxed to [employer] [facility]; [phone]

## 2024-05-14 ENCOUNTER — Ambulatory Visit
Admission: RE | Admit: 2024-05-14 | Discharge: 2024-05-14 | Disposition: A | Source: Ambulatory Visit | Attending: Physician Assistant

## 2024-05-14 ENCOUNTER — Ambulatory Visit
Admission: RE | Admit: 2024-05-14 | Discharge: 2024-05-14 | Disposition: A | Discharge status: Home / Self Care | Source: Ambulatory Visit | Attending: Physician Assistant | Admitting: Physician Assistant

## 2024-05-14 DIAGNOSIS — R928 Other abnormal and inconclusive findings on diagnostic imaging of breast: Secondary | ICD-10-CM | POA: Diagnosis present

## 2024-05-15 NOTE — Procedures (Signed)
 [facility]  Polysomnogram Report Part I                                                               Phone: [phone] Fax: [phone]  Patient Name: Hailey Wong, Hailey Wong. Acquisition Number: [id number]  Date of Birth: 1945-05-03 Acquisition Date: 04/30/2024  Referring Physician: Tinnie Pro, PA-C     History: The patient is a 79 year old  who was referred for evaluation of . Medical History: chronic cough, hypertension, hypothyroidism, sinus trouble, mild cardiomegaly, SOB, nocturnal hypoxemia, aortic rootdilatation, aortic atherosclerosis and sleep apnea.  Medications: aspirin  81, calcium  carbonate, cholecalciferol , vitamin B12, elderberry-vitamin C-zinc , fluticasone , ibandronate , levothyroxine , lisinopril , polyethylene glycol, rosuvastatin  and sennosides-decosate sodium.  Procedure: This routine overnight polysomnogram was performed on the [facility] 5 using the standard diagnostic protocol. This included 6 channels of EEG, 2 channels of EOG, chin EMG, bilateral anterior tibialis EMG, nasal/oral thermistor, PTAF (nasal pressure transducer), chest and abdominal wall movements, EKG, and pulse oximetry.  Description: The total recording time was 366.5 minutes. The total sleep time was 291.5 minutes. There were a total of 53.5 minutes of wakefulness after sleep onset for a reducedsleep efficiency of 79.5%. The latency to sleep onset was within normal limitsat 21.5 minutes. The R sleep onset latency was short at 48.0 minutes. Sleep parameters, as a percentage of the total sleep time, demonstrated 12.5% of sleep was in N1 sleep, 57.5% N2, 16.0% N3 and 14.1% R sleep. There were a total of 101 arousals for an arousal index of 20.8 arousals per hour of sleep that was elevated.  Respiratory monitoring demonstrated   snoring. All sleep was in the supine position. There were 99 apneas and hypopneas for an Apnea Hypopnea Index of 20.4 apneas and hypopneas per hour of sleep. The REM related apnea  hypopnea index was 84.9/hr. of REM sleep compared to a NREM AHI of 9.8/hr.  The average duration of the respiratory events was 19.0 seconds with a maximum duration of 54.0 seconds. The respiratory events were associated with peripheral oxygen  desaturations on the average to 83%. The lowest oxygen  desaturation associated with a respiratory event was 70%. Additionally, the baseline oxygen  saturation during wakefulness was 94%, during NREM sleep averaged 92%, and during REM sleep averaged 87%. The total duration of oxygen  < 90% was 38.8 minutes and <80% was 2.5 minutes.  Cardiac monitoring-  demonstrate transient cardiac decelerations associated with the apneas.  significant cardiac rhythm irregularities.   Periodic limb movement monitoring- demonstrated that there were 226 periodic limb movements for a periodic limb movement index of 46.5 periodic limb movements per hour of sleep. Quasi-periodic limb movements were observed prior to sleep onset.  Impression: This routine overnight polysomnogram demonstrated significant obstructive sleep apnea with an overall Apnea Hypopnea Index of 20.4 apneas and hypopneas per hour of sleep. The respiratory events occurred mostly during REM sleep with an AHI of 84.9. As REM percentage was reduced, the findings likely underestimate the severity of the sleep apnea. The lowest desaturation was to 70%. All sleep was in the supine position.  There was a significantly elevated periodic limb movement index of 46.5 periodic limb movements per hour of sleep. In addition, quasi-periodic limb movements were observed prior to sleep onset. Sometimes these limb movements subside once the  apnea is controlled. Clinical correlation is suggested.  reduced sleep efficiency with anelevated arousal index,increased awakenings and a reduced REM percentage. These findings would appear to be due to the combination of obstructive sleep apnea and periodic limb movements.   Recommendations:    A  CPAP titration would be recommended due to the severity of the sleep apnea. Some supine sleep should be ensured to optimize the titration. Additionally, would recommend weight loss in a patient with a BMI of 27.4.     Elfreda RONAL Bathe, MD, [facility] Diplomate ABMS-Pulmonary, Critical Care and Sleep Medicine  Electronically reviewed and digitally signed  [facility] Polysomnogram Report Part II  Phone: [phone] Fax: [phone]  Patient last name Azbell Neck Size 14.0 in. Acquisition [id number]  Patient first name Hailey Wong. Weight 145.0 lbs. Started 04/30/2024 at 11:28:09 PM  Birth date [birth year] Height 61.0 in. Stopped 05/01/2024 at 5:49:39 AM  Age 27 BMI 27.4 lb./in2 Duration 366.5  Study Type Adult      Selinda Capron, RPSGT/ Harlene Kemp  Reviewed by: Marval MATSU. Henke, PhD, ABSM, FAASM Sleep Data: Lights Out: 11:38:39 PM Sleep Onset: 12:00:09 AM  Lights On: 5:45:09 AM Sleep Efficiency: 79.5 %  Total Recording Time: 366.5 min Sleep Latency (from Lights Off) 21.5 min  Total Sleep Time (TST): 291.5 min R Latency (from Sleep Onset): 48.0 min  Sleep Period Time: 339.5 min Total number of awakenings: 38  Wake during sleep: 48.0 min Wake After Sleep Onset (WASO): 53.5 min   Sleep Data:         Arousal Summary: Stage  Latency from lights out (min) Latency from sleep onset (min) Duration (min) % Total Sleep Time  Normal values  N 1 21.5 0.0 36.5 12.5 (5%)  N 2 24.0 2.5 167.5 57.5 (50%)  N 3 58.0 36.5 46.5 16.0 (20%)  R 69.5 48.0 41.0 14.1 (25%)   Number Index  Spontaneous 53 10.9  Apneas & Hypopneas 14 2.9  RERAs 0 0.0       (Apneas & Hypopneas & RERAs)  (14) (2.9)  Limb Movement 37 7.6  Snore 0 0.0  TOTAL 104 21.4     Respiratory Data:  CA OA MA Apnea Hypopnea* A+ H RERA Total  Number 1 85 0 86 13 99 0 99  Mean Dur (sec) 15.5 18.3 0.0 18.3 24.1 19.0 0.0 19.0  Max Dur (sec) 15.5 54.0 0.0 54.0 50.5 54.0 0.0 54.0  Total Dur (min) 0.3 25.9 0.0 26.2 5.2 31.4 0.0 31.4  %  of TST 0.1 8.9 0.0 9.0 1.8 10.8 0.0 10.8  Index (#/h TST) 0.2 17.5 0.0 17.7 2.7 20.4 0.0 20.4  *Hypopneas scored based on 4% or greater desaturation.  Sleep Stage:        REM NREM TST  AHI 84.9 9.8 20.4  RDI 84.9 9.8 20.4           Body Position Data:  Sleep (min) TST (%) REM (min) NREM (min) CA (#) OA (#) MA (#) HYP (#) AHI (#/h) RERA (#) RDI (#/h) Desat (#)  Supine 291.5 100.00 41.0 250.5 1 85 0 13 20.4 0 20.4 96  Non-Supine 0.00 0.00 0.00 0.00 0.00 0.00 0.00 0.00 0.00 0 0.00 0.00     Snoring: Total number of snoring episodes  0  Total time with snoring    min (   % of sleep)   Oximetry Distribution:             WK REM NREM  TOTAL  Average (%)   94 87 92 92  < 90% 4.0 28.2 6.6 38.8  < 80% 0.4 2.1 0.0 2.5  < 70% 0.0 0.0 0.0 0.0  # of Desaturations* 4 58 34 96  Desat Index (#/hour) 3.2 84.9 8.1 19.8  Desat Max (%) 13 18 14 18   Desat Max Dur (sec) 113.0 52.0 97.0 113.0  Approx Min O2 during sleep 70  Approx min O2 during a respiratory event 70  Was Oxygen  added (Y/N) and final rate :    LPM  *Desaturations based on 3% or greater drop from baseline.   Cheyne Stokes Breathing: None Present   Heart Rate Summary:  Average Heart Rate During Sleep 62.0 bpm      Highest Heart Rate During Sleep (95th %) 75.0 bpm      Highest Heart Rate During Sleep 137 bpm      Highest Heart Rate During Recording (TIB) 208 bpm (artifact)   Heart Rate Observations: Event Type # Events   Bradycardia 0 Lowest HR Scored: N/A  Sinus Tachycardia During Sleep 0 Highest HR Scored: N/A  Narrow Complex Tachycardia 0 Highest HR Scored: N/A  Wide Complex Tachycardia 0 Highest HR Scored: N/A  Asystole 0 Longest Pause: N/A  Atrial Fibrillation 0 Duration Longest Event: N/A  Other Arrythmias   Type:    Periodic Limb Movement Data: (Primary legs unless otherwise noted) Total # Limb Movement 243 Limb Movement Index 50.0  Total # PLMS 226 PLMS Index 46.5  Total # PLMS Arousals 32 PLMS  Arousal Index 6.6  Percentage Sleep Time with PLMS 129.28min (44.5 % sleep)  Mean Duration limb movements (secs) 410.0

## 2024-05-22 ENCOUNTER — Ambulatory Visit
Admission: RE | Admit: 2024-05-22 | Discharge: 2024-05-22 | Disposition: A | Discharge status: Home / Self Care | Source: Ambulatory Visit | Attending: Physician Assistant | Admitting: Physician Assistant

## 2024-05-22 ENCOUNTER — Ambulatory Visit
Admission: RE | Admit: 2024-05-22 | Discharge: 2024-05-22 | Disposition: A | Source: Ambulatory Visit | Attending: Physician Assistant

## 2024-05-22 DIAGNOSIS — D242 Benign neoplasm of left breast: Secondary | ICD-10-CM | POA: Insufficient documentation

## 2024-05-22 DIAGNOSIS — R928 Other abnormal and inconclusive findings on diagnostic imaging of breast: Secondary | ICD-10-CM | POA: Diagnosis present

## 2024-05-22 DIAGNOSIS — E871 Hypo-osmolality and hyponatremia: Secondary | ICD-10-CM | POA: Insufficient documentation

## 2024-05-22 MED ORDER — LIDOCAINE-EPINEPHRINE 1 %-1:100000 IJ SOLN
10.0000 mL | Freq: Once | INTRAMUSCULAR | Status: AC
  Administered 2024-05-22: 10 mL via INTRADERMAL
  Filled 2024-05-22: qty 10

## 2024-05-22 MED ORDER — LIDOCAINE 1 % OPTIME INJ - NO CHARGE
2.0000 mL | Freq: Once | INTRAMUSCULAR | Status: AC
  Administered 2024-05-22: 2 mL via INTRADERMAL
  Filled 2024-05-22: qty 2

## 2024-05-23 LAB — SURGICAL PATHOLOGY

## 2024-06-10 ENCOUNTER — Ambulatory Visit: Admitting: Physician Assistant

## 2025-02-03 ENCOUNTER — Ambulatory Visit: Admitting: Physician Assistant
